# Patient Record
Sex: Female | Born: 1965 | Race: White | Hispanic: No | Marital: Married | Smoking: Never smoker
Health system: Southern US, Community
[De-identification: ages and names within clinical notes are randomized; demographics above are authoritative.]

## PROBLEM LIST (undated history)

## (undated) DIAGNOSIS — Z9889 Other specified postprocedural states: Secondary | ICD-10-CM

## (undated) DIAGNOSIS — T8859XA Other complications of anesthesia, initial encounter: Secondary | ICD-10-CM

## (undated) DIAGNOSIS — K529 Noninfective gastroenteritis and colitis, unspecified: Secondary | ICD-10-CM

## (undated) DIAGNOSIS — R112 Nausea with vomiting, unspecified: Secondary | ICD-10-CM

## (undated) DIAGNOSIS — R519 Headache, unspecified: Secondary | ICD-10-CM

## (undated) HISTORY — PX: WISDOM TOOTH EXTRACTION: SHX21

## (undated) HISTORY — PX: MYRINGOTOMY: SUR874

## (undated) HISTORY — DX: Noninfective gastroenteritis and colitis, unspecified: K52.9

## (undated) HISTORY — PX: OTHER SURGICAL HISTORY: SHX169

## (undated) HISTORY — PX: RHINOPLASTY: SUR1284

---

## 1987-03-08 HISTORY — PX: RHINOPLASTY: SUR1284

## 1988-03-07 HISTORY — PX: OTHER SURGICAL HISTORY: SHX169

## 1997-03-07 HISTORY — PX: LAPAROTOMY: SHX154

## 2008-12-11 ENCOUNTER — Ambulatory Visit: Payer: Self-pay | Admitting: Gastroenterology

## 2008-12-11 ENCOUNTER — Encounter: Payer: Self-pay | Admitting: Urgent Care

## 2008-12-11 DIAGNOSIS — N809 Endometriosis, unspecified: Secondary | ICD-10-CM | POA: Insufficient documentation

## 2008-12-11 DIAGNOSIS — K5909 Other constipation: Secondary | ICD-10-CM

## 2008-12-11 DIAGNOSIS — Z8719 Personal history of other diseases of the digestive system: Secondary | ICD-10-CM | POA: Insufficient documentation

## 2008-12-15 LAB — CONVERTED CEMR LAB: IgA: 246 mg/dL (ref 68–378)

## 2009-01-27 ENCOUNTER — Ambulatory Visit: Payer: Self-pay | Admitting: Gastroenterology

## 2009-01-27 DIAGNOSIS — R198 Other specified symptoms and signs involving the digestive system and abdomen: Secondary | ICD-10-CM

## 2009-05-05 HISTORY — PX: ESOPHAGOGASTRODUODENOSCOPY: SHX1529

## 2009-05-05 HISTORY — PX: COLONOSCOPY: SHX174

## 2009-05-26 ENCOUNTER — Ambulatory Visit: Payer: Self-pay | Admitting: Gastroenterology

## 2009-05-26 DIAGNOSIS — R109 Unspecified abdominal pain: Secondary | ICD-10-CM | POA: Insufficient documentation

## 2009-05-27 LAB — CONVERTED CEMR LAB
ALT: 13 units/L (ref 0–35)
AST: 15 units/L (ref 0–37)
Albumin: 4.6 g/dL (ref 3.5–5.2)
Alkaline Phosphatase: 51 units/L (ref 39–117)
BUN: 11 mg/dL (ref 6–23)
Bacteria, UA: NONE SEEN
Basophils Absolute: 0 10*3/uL (ref 0.0–0.1)
Basophils Relative: 0 % (ref 0–1)
Bilirubin Urine: NEGATIVE
CO2: 26 meq/L (ref 19–32)
Calcium: 9.9 mg/dL (ref 8.4–10.5)
Casts: NONE SEEN /lpf
Chloride: 103 meq/L (ref 96–112)
Creatinine, Ser: 0.84 mg/dL (ref 0.40–1.20)
Eosinophils Absolute: 0.1 10*3/uL (ref 0.0–0.7)
Eosinophils Relative: 1 % (ref 0–5)
Glucose, Bld: 92 mg/dL (ref 70–99)
HCT: 43.6 % (ref 36.0–46.0)
Hemoglobin, Urine: NEGATIVE
Hemoglobin: 13.5 g/dL (ref 12.0–15.0)
Ketones, ur: NEGATIVE mg/dL
Leukocytes, UA: NEGATIVE
Lipase: 41 units/L (ref 0–75)
Lymphocytes Relative: 39 % (ref 12–46)
Lymphs Abs: 2.7 10*3/uL (ref 0.7–4.0)
MCHC: 31 g/dL (ref 30.0–36.0)
MCV: 98.9 fL (ref 78.0–100.0)
Monocytes Absolute: 0.5 10*3/uL (ref 0.1–1.0)
Monocytes Relative: 6 % (ref 3–12)
Neutro Abs: 3.8 10*3/uL (ref 1.7–7.7)
Neutrophils Relative %: 54 % (ref 43–77)
Nitrite: NEGATIVE
Platelets: 324 10*3/uL (ref 150–400)
Potassium: 4 meq/L (ref 3.5–5.3)
Protein, ur: NEGATIVE mg/dL
RBC / HPF: NONE SEEN (ref <3)
RBC: 4.41 M/uL (ref 3.87–5.11)
RDW: 13 % (ref 11.5–15.5)
Sodium: 140 meq/L (ref 135–145)
Specific Gravity, Urine: 1.021 (ref 1.005–1.030)
Squamous Epithelial / LPF: NONE SEEN /lpf
Total Bilirubin: 0.4 mg/dL (ref 0.3–1.2)
Total Protein: 7.2 g/dL (ref 6.0–8.3)
Urine Glucose: NEGATIVE mg/dL
Urobilinogen, UA: 1 (ref 0.0–1.0)
WBC, UA: NONE SEEN cells/hpf (ref <3)
WBC: 7 10*3/uL (ref 4.0–10.5)
pH: 7.5 (ref 5.0–8.0)

## 2009-05-29 ENCOUNTER — Telehealth (INDEPENDENT_AMBULATORY_CARE_PROVIDER_SITE_OTHER): Payer: Self-pay

## 2009-06-02 ENCOUNTER — Ambulatory Visit (HOSPITAL_COMMUNITY): Admission: RE | Admit: 2009-06-02 | Discharge: 2009-06-02 | Payer: Self-pay | Admitting: Gastroenterology

## 2009-06-02 ENCOUNTER — Ambulatory Visit: Payer: Self-pay | Admitting: Gastroenterology

## 2009-07-29 ENCOUNTER — Ambulatory Visit: Payer: Self-pay | Admitting: Gastroenterology

## 2010-03-17 ENCOUNTER — Encounter: Payer: Self-pay | Admitting: Internal Medicine

## 2010-03-17 ENCOUNTER — Ambulatory Visit
Admission: RE | Admit: 2010-03-17 | Discharge: 2010-03-17 | Payer: Self-pay | Source: Home / Self Care | Attending: Internal Medicine | Admitting: Internal Medicine

## 2010-03-23 ENCOUNTER — Ambulatory Visit (HOSPITAL_COMMUNITY)
Admission: RE | Admit: 2010-03-23 | Discharge: 2010-03-23 | Payer: Self-pay | Source: Home / Self Care | Attending: Internal Medicine | Admitting: Internal Medicine

## 2010-03-29 ENCOUNTER — Encounter: Payer: Self-pay | Admitting: Internal Medicine

## 2010-04-06 ENCOUNTER — Encounter (HOSPITAL_COMMUNITY)
Admission: RE | Admit: 2010-04-06 | Discharge: 2010-04-06 | Payer: Self-pay | Source: Home / Self Care | Attending: Internal Medicine | Admitting: Internal Medicine

## 2010-04-06 NOTE — Letter (Signed)
Summary: TCS/EGD ORDER  TCS/EGD ORDER   Imported By: Ave Filter 05/26/2009 16:05:51  _____________________________________________________________________  External Attachment:    Type:   Image     Comment:   External Document

## 2010-04-06 NOTE — Assessment & Plan Note (Signed)
Summary: RUQ ABD PAIN, DIARRHEA   Visit Type:  Follow-up Visit Primary Care Provider:  Daphine Deutscher, NP-c  Chief Complaint:  abd pain.  History of Present Illness: Starting to have pain in upr abd. RUQ, sharp & achy, moves to right flank. Coming and going over the past 2 weeks. Continuous for past 2 days. Same thing happened in JAN 2011. Had GB check in the past and within last 5 years. feels like stomach doesn't empty after a meal. Feels full after late snack and doesn't want to eat because she swakens feeling full. Ears cause she tinks she should. Eats "whole grains" and organic, fruits, vegetables. In past cantelope or strawberry used to cause a BM and now nothing. Started to take Magnesium for the past 2 weeks.  No worse with eating. Nothing makes it worse. Doesn't keep her awake at night. No nausea or vomiting. No problems swallowing. May feel like a lump in right chest when she swallows. Average 2-3x/week.  Took the Amitiza and it made her feel weird. No BRBPR, or black stool. No ASA, BC, Goody's, or Aleve. Rare Ibuprofen. Still having the tiny ball stool. Never had a top or bottom look see. Last EtOH:  ~1 mo ago.  Current Medications (verified): 1)  Birth Control .... Once Daily 2)  Multivitamin .... Once Daily 3)  Magnesium 350 Mg .... Once Daily  Allergies (verified): 1)  ! Codeine 2)  ! Pcn 3)  ! 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 Head:  Normocephalic and atraumatic. Eyes:  PERRLA, no icterus. Mouth:  No deformity or lesions. Neck:  Supple; no masses. Lungs:  Clear throughout to auscultation. Heart:  Regular rate and rhythm; no murmurs. Abdomen:  Soft, mild TTP in epigastrium, and nondistended.  Normal bowel sounds. No TTP in RUQ with inspiration. Extremities:  No edema or deformities noted. Neurologic:  Alert and  oriented x4;  grossly normal neurologically.  Impression & Recommendations:  Problem # 1:  ABDOMINAL PAIN (ICD-789.00) Likely 2o to IBS-d. Differential diagnosis includes CELIAC SPRUE,endometriosis, H. pylori gastritis, less likely pancreatitis, cholecystitis, pancreatic CA, UTI, or kidney stone. Labs and then TCS/EGD, SUPREP, DUODENAL, GASTRIC, AND COLON BIOPSIES. WILL NEED PHENERGAN. Pt has jad NV after sedation. If etiology revealed for pain, then proceed w/ CT AP w/ ivc. OPV in 2 mos.  Orders: T-CBC w/Diff [phone]) T-Comprehensive Metabolic Panel [phone]) T-Lipase [phone]) T-Urinalysis ([id number]) T-Urine Microscopic ([id number])   cc: PCP  Appended Document: Orders Update    Clinical Lists Changes  Orders: Added new Service order of Est. Patient Level V (87564) - Signed

## 2010-04-06 NOTE — Progress Notes (Signed)
Summary: PHONE NOTE/ FYI  FOR DR. FIELDS  Phone Note Call from Patient   Caller: Patient Summary of Call: PT WANTED TO LET DR. FIELDS KNOW THAT SHE HAS HAD SURGERIES IN THE PAST AND HAD NAUSEA/VOMITING. JUST WANTED HER TO BE AWARE, SO SHE COULD MAKE PROVISIONS. Initial call taken by: Cloria Spring LPN,  May 29, 2009 3:56 PM

## 2010-04-06 NOTE — Assessment & Plan Note (Signed)
Summary: IBS-MIXED   Visit Type:  Follow-up Visit Primary Care Provider:  Paulene Floor  Chief Complaint:  F/U TCS/EGD.  History of Present Illness: Feeling better. Mg helping. No bloating, and RUQ pain improved. Pt felt it took a prolonged period of time to recover from sedation and vague rectal of painful moments during the procedure. Has awakened during surgeyr before.  Current Medications (verified): 1)  Birth Control .... Once Daily 2)  Multivitamin .... Once Daily 3)  Magnesium 350 Mg .... Once Daily  Allergies (verified): 1)  ! Codeine 2)  ! Pcn 3)  ! 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 Head:  Normocephalic and atraumatic. Lungs:  Clear throughout to auscultation. Heart:  Regular rate and rhythm; no murmurs. Abdomen:  PT IS EASILY TICKLED. Soft, nontender and nondistended.  Normal bowel sounds.  Impression & Recommendations:  Problem # 1:  IRRITABLE BOWEL SYNDROME, HX OF (ICD-V12.79) Assessment Improved  Continue Mg and discussed benefits and side effects of a high fiber diet. TCS in 10 YEARS WITH OVERTUBE AND PROPOFOL. OPV as needed.  CC: PCP  Orders: Est. Patient Level II (13086)  Appended Document: IBS-MIXED REMINDER IN COMPUTER

## 2010-04-08 ENCOUNTER — Encounter: Payer: Self-pay | Admitting: Internal Medicine

## 2010-04-08 NOTE — Assessment & Plan Note (Signed)
Summary: stomach pain/ss   Visit Type:  Follow-up Visit Primary Care Provider:  Paulene Floor  CC:  abd pain.  History of Present Illness: Here for f/u. Long-standing RUQ pain. EGD/colon done 3/11.   States RUQ abdominal pain worsened since Christmas. At one point had severe episode of nausea, almost felt like was going to pass out. Feels like a stabbing pain in RUQ, constant, not as bad first thing in the morning. Feels like a band that stretches across top of mid abdomen. Feels like fills up fast, pain not exacerbated by eating drinking. As far as IBS, trying to incorporate fiber into dietary choices, normally BM 3x/week. No cramping. Has tried several probiotics in past and not noticed a difference.    [place] of abd 2008 which was reportedly nl MRI, reportedly nl 1. Extremely tortuous colon.  Two 3-mm sessile transverse colon polyps     removed via cold forceps.  Random cold forceps biopsies obtained     via cold forceps to evaluate for microscopic colitis as an etiology     for intermittent diarrhea.  No masses, inflammatory changes, or     diverticular AVMs seen. 2. Normal retroflexed view of the rectum. 3. Normal esophagus without evidence of Barrett's mass, erosion,     ulceration, or stricture. 4. Mild patchy erythema in the antrum without erosion or ulceration.     Biopsies obtained via cold forceps to evaluate for H. pylori     gastritis or eosinophilic gastritis. 5. Normal-appearing duodenal bulb and second portion of the duodenum     with moderate bile staining.  Biopsies obtained via cold forceps to     evaluate for celiac sprue as an etiology for intermittent diarrhea     and dyspepsia.  Current Medications (verified): 1)  Birth Control .... Once Daily 2)  Multivitamin .... Once Daily 3)  Magnesium 350 Mg .... Once Daily 4)  Flax Seed Oil .... Once Daily  Allergies (verified): 1)  ! Codeine 2)  ! Pcn 3)  ! Toradol  Review of Systems General:  Denies fever, chills,  and anorexia. Eyes:  Denies blurring, irritation, and discharge. ENT:  Denies sore throat, hoarseness, and difficulty swallowing. CV:  Denies chest pains and syncope. Resp:  Denies dyspnea at rest and wheezing. GI:  Complains of abdominal pain and constipation; denies difficulty swallowing, pain on swallowing, nausea, change in bowel habits, bloody BM's, and black BMs. GU:  Denies urinary burning and urinary frequency. MS:  Denies joint pain / LOM, joint swelling, and joint stiffness. Derm:  Denies rash, itching, and dry skin. Neuro:  Denies weakness and syncope. Psych:  Denies depression and anxiety. Endo:  Denies cold intolerance and heat intolerance.  Vital Signs:  Patient profile:   45 year old female Height:      68.5 inches Weight:      144 pounds BMI:     21.65 Temp:     99.2 degrees F oral Pulse rate:   72 / minute BP sitting:   122 / 88  (left arm) Cuff size:   regular  Vitals Entered By: Hendricks Limes LPN (March 17, 2010 2:23 PM)  Physical Exam  General:  Well developed, well nourished, no acute distress. Head:  Normocephalic and atraumatic. Eyes:  PERRLA, no icterus. Lungs:  Clear throughout to auscultation. Heart:  Regular rate and rhythm; no murmurs, rubs,  or bruits. Abdomen:  VERY ticklish. +BS. NT, ND. no HSM. no rebound or guarding.  Msk:  Symmetrical with no  gross deformities. Normal posture. Neurologic:  Alert and  oriented x4;  grossly normal neurologically. Psych:  Alert and cooperative. Normal mood and affect.  Impression & Recommendations:  Problem # 1:  ABDOMINAL PAIN (ICD-51.18)  45 year old pleasant female with long-standing hx of chronic RUQ abdominal pain. Has undergone EGD 3/11. No etiology to explain pain. [place] in 2008 reportedly nl. Not exacerbated by eating/drinking. Has not undergone HIDA. ?biliary component although [place] in past reportedly nl. May be functional abdominal pain. Pt hesitant to try many oral medications. states she does not like  taking anything extra.  Obtain recent labs done in Nov (included CBC, CMP) [place] of abdomen. Depending on results, will likely order HIDA  Orders: Est. Patient Level II (16109)  Problem # 2:  IRRITABLE BOWEL SYNDROME, HX OF (ICD-V12.79)  Stable for pt. BM 3X per week. No pain or bloating. Incorporating fiber. Probiotics don't seem to make a difference. Continue current regimen.   Orders: Est. Patient Level II (60454)

## 2010-04-08 NOTE — Letter (Signed)
Summary: RAD ORDER U/S ABD  RAD ORDER U/S ABD   Imported By: Rexene Alberts 03/17/2010 15:03:52  _____________________________________________________________________  External Attachment:    Type:   Image     Comment:   External Document

## 2010-04-08 NOTE — Letter (Signed)
Summary: HIDA SCAN ORDER  HIDA SCAN ORDER   Imported By: Ave Filter 03/29/2010 14:25:27  _____________________________________________________________________  External Attachment:    Type:   Image     Comment:   External Document

## 2010-04-14 NOTE — Letter (Signed)
Summary: SURGICAL REFERRAL  SURGICAL REFERRAL   Imported By: Ave Filter 04/08/2010 13:46:02  _____________________________________________________________________  External Attachment:    Type:   Image     Comment:   External Document  Appended Document: SURGICAL REFERRAL Pt aware of appt.

## 2012-06-11 ENCOUNTER — Telehealth: Payer: Self-pay | Admitting: Nurse Practitioner

## 2012-06-11 ENCOUNTER — Encounter: Payer: Self-pay | Admitting: General Practice

## 2012-06-11 ENCOUNTER — Ambulatory Visit (INDEPENDENT_AMBULATORY_CARE_PROVIDER_SITE_OTHER): Payer: BC Managed Care – PPO | Admitting: General Practice

## 2012-06-11 VITALS — BP 139/91 | HR 84 | Temp 99.9°F | Ht 68.5 in | Wt 145.5 lb

## 2012-06-11 DIAGNOSIS — J029 Acute pharyngitis, unspecified: Secondary | ICD-10-CM

## 2012-06-11 MED ORDER — CEFDINIR 300 MG PO CAPS: 300.0000 mg | ORAL_CAPSULE | Freq: Two times a day (BID) | ORAL | Status: AC

## 2012-06-11 NOTE — Progress Notes (Signed)
  Subjective:    Patient ID: Destiny Gardner, female    DOB: 1965/11/09, 47 y.o.   MRN: [id number]  Sore Throat  This is a new problem. The current episode started in the past 7 days. The problem has been unchanged. The pain is worse on the right side. The maximum temperature recorded prior to her arrival was 100 - 100.9 F. The pain is at a severity of 8/10. The pain is moderate. Associated symptoms include headaches and neck pain. Pertinent negatives include no abdominal pain, congestion, coughing, diarrhea, ear pain or shortness of breath. She has had exposure to strep. She has tried NSAIDs for the symptoms. The treatment provided mild relief.      Review of Systems  Constitutional: Negative for fever, chills, activity change and appetite change.  HENT: Positive for neck pain. Negative for ear pain and congestion.   Respiratory: Negative for cough and shortness of breath.   Gastrointestinal: Negative for abdominal pain and diarrhea.  Genitourinary: Negative for difficulty urinating.  Skin: Negative for rash.  Neurological: Positive for light-headedness and headaches. Negative for dizziness.  Psychiatric/Behavioral: Negative.        Objective:   Physical Exam  Constitutional: She is oriented to person, place, and time. She appears well-developed and well-nourished.  HENT:  Mouth/Throat: Posterior oropharyngeal erythema present.  Eyes: Conjunctivae and EOM are normal. Pupils are equal, round, and reactive to light.  Neck: No thyromegaly present.  Cardiovascular: Normal rate, regular rhythm and normal heart sounds.   No murmur heard. Pulmonary/Chest: Effort normal and breath sounds normal. No respiratory distress. She exhibits no tenderness.  Lymphadenopathy:    She has no cervical adenopathy.  Neurological: She is alert and oriented to person, place, and time.  Skin: Skin is warm and dry. No rash noted.  Psychiatric: She has a normal mood and affect.   Results for orders placed in  visit on 06/11/12  POCT RAPID STREP A (OFFICE)      Result Value Range   Rapid Strep A Screen Negative  Negative         Assessment & Plan:  Continue antibiotics even if feeling better Increase fluid intake Motrin or tylenol OTC OTC decongestant Throat lozenges if help New toothbrush in 3 days Proper handwashing Patient verbalized understanding RTO if symptoms worsen or unresolved  Raymon Mutton, FNP-C

## 2012-06-11 NOTE — Patient Instructions (Signed)
Viral Pharyngitis  Viral pharyngitis is a viral infection that produces redness, pain, and swelling (inflammation) of the throat. It can spread from person to person (contagious).  CAUSES  Viral pharyngitis is caused by inhaling a large amount of certain germs called viruses. Many different viruses cause viral pharyngitis.  SYMPTOMS  Symptoms of viral pharyngitis include:   Sore throat.   Tiredness.   Stuffy nose.   Low-grade fever.   Congestion.   Cough.  TREATMENT  Treatment includes rest, drinking plenty of fluids, and the use of over-the-counter medication (approved by your caregiver).  HOME CARE INSTRUCTIONS    Drink enough fluids to keep your urine clear or pale yellow.   Eat soft, cold foods such as ice cream, frozen ice pops, or gelatin dessert.   Gargle with warm salt water (1 tsp salt per 1 qt of water).   If over age 7, throat lozenges may be used safely.   Only take over-the-counter or prescription medicines for pain, discomfort, or fever as directed by your caregiver. Do not take aspirin.  To help prevent spreading viral pharyngitis to others, avoid:   Mouth-to-mouth contact with others.   Sharing utensils for eating and drinking.   Coughing around others.  SEEK MEDICAL CARE IF:    You are better in a few days, then become worse.   You have a fever or pain not helped by pain medicines.   There are any other changes that concern you.  Document Released: 12/01/2004 Document Revised: 05/16/2011 Document Reviewed: 04/29/2010  Exit[employer] Patient Information 2013 [employer].

## 2012-06-11 NOTE — Telephone Encounter (Signed)
appt made

## 2013-03-07 HISTORY — PX: HERNIA REPAIR: SHX51

## 2014-01-10 ENCOUNTER — Ambulatory Visit (INDEPENDENT_AMBULATORY_CARE_PROVIDER_SITE_OTHER): Payer: BC Managed Care – PPO | Admitting: Family Medicine

## 2014-01-10 VITALS — BP 131/80 | HR 79 | Temp 100.6°F | Ht 68.5 in | Wt 147.4 lb

## 2014-01-10 DIAGNOSIS — J029 Acute pharyngitis, unspecified: Secondary | ICD-10-CM

## 2014-01-10 LAB — POCT RAPID STREP A (OFFICE): Rapid Strep A Screen: NEGATIVE

## 2014-01-10 MED ORDER — CEFDINIR 300 MG PO CAPS: 300.0000 mg | ORAL_CAPSULE | Freq: Two times a day (BID) | ORAL | Status: DC

## 2014-01-10 NOTE — Progress Notes (Signed)
   Subjective:    Patient ID: Destiny Gardner, female    DOB: 1965/09/16, 48 y.o.   MRN: [id number]  HPI C/o sore throat.  She is having fever and fatigue  Review of Systems  Constitutional: Negative for fever.  HENT: Negative for ear pain.   Eyes: Negative for discharge.  Respiratory: Negative for cough.   Cardiovascular: Negative for chest pain.  Gastrointestinal: Negative for abdominal distention.  Endocrine: Negative for polyuria.  Genitourinary: Negative for difficulty urinating.  Musculoskeletal: Negative for gait problem and neck pain.  Skin: Negative for color change and rash.  Neurological: Negative for speech difficulty and headaches.  Psychiatric/Behavioral: Negative for agitation.       Objective:    BP 131/80 mmHg  Pulse 79  Temp(Src) 100.6 F (38.1 C) (Oral)  Ht 5' 8.5" (1.74 m)  Wt 147 lb 6.4 oz (66.86 kg)  BMI 22.08 kg/m2 Physical Exam  Constitutional: She is oriented to person, place, and time. She appears well-developed and well-nourished.  HENT:  Head: Normocephalic and atraumatic.  Mouth/Throat: Oropharynx is clear and moist.  Eyes: Pupils are equal, round, and reactive to light.  Neck: Normal range of motion. Neck supple.  Cardiovascular: Normal rate and regular rhythm.   No murmur heard. Pulmonary/Chest: Effort normal and breath sounds normal.  Abdominal: Soft. Bowel sounds are normal. There is no tenderness.  Neurological: She is alert and oriented to person, place, and time.  Skin: Skin is warm and dry.  Psychiatric: She has a normal mood and affect.          Assessment & Plan:     ICD-9-CM ICD-10-CM   1. Sore throat 462 J02.9 POCT rapid strep A   Push po fluids, rest, tylenol and motrin otc prn as directed for fever, arthralgias, and myalgias.  Follow up prn if sx's continue or persist.  Return if symptoms worsen or fail to improve.  Deatra CanterWilliam J Nga Rabon FNP

## 2014-01-17 ENCOUNTER — Other Ambulatory Visit: Payer: Self-pay | Admitting: Family Medicine

## 2014-01-17 MED ORDER — CEFDINIR 300 MG PO CAPS: 300.0000 mg | ORAL_CAPSULE | Freq: Two times a day (BID) | ORAL | Status: DC

## 2014-01-17 NOTE — Telephone Encounter (Signed)
[facility] Verbalizes understanding

## 2014-01-18 ENCOUNTER — Ambulatory Visit (INDEPENDENT_AMBULATORY_CARE_PROVIDER_SITE_OTHER): Payer: BC Managed Care – PPO | Admitting: Family Medicine

## 2014-01-18 VITALS — BP 136/78 | HR 102 | Temp 98.3°F | Wt 142.8 lb

## 2014-01-18 DIAGNOSIS — R05 Cough: Secondary | ICD-10-CM

## 2014-01-18 DIAGNOSIS — R059 Cough, unspecified: Secondary | ICD-10-CM

## 2014-01-18 DIAGNOSIS — J069 Acute upper respiratory infection, unspecified: Secondary | ICD-10-CM

## 2014-01-18 NOTE — Progress Notes (Signed)
   Subjective:    Patient ID: Destiny Gardner, female    DOB: Sep 21, 1965, 48 y.o.   MRN: [id number]  HPI Pt is here today for sore throat and cough.  She is currently taking Omnicef.  She is scheduled to have a hernia repair on Wednesday.   Review of Systems  HENT: Positive for sore throat. Negative for ear pain and postnasal drip.   Respiratory: Positive for cough (dry x 3-4 days).   Neurological: Negative for headaches.       Objective:   Physical Exam  Constitutional: She is oriented to person, place, and time. She appears well-developed and well-nourished. No distress.  HENT:  Head: Normocephalic and atraumatic.  Right Ear: External ear normal.  Left Ear: External ear normal.  Nose: Nose normal.  Mouth/Throat: Oropharynx is clear and moist. No oropharyngeal exudate.  Minimal redness posterior throat  Eyes: Conjunctivae and EOM are normal. Pupils are equal, round, and reactive to light. Right eye exhibits no discharge. Left eye exhibits no discharge. No scleral icterus.  Neck: Normal range of motion. Neck supple. No thyromegaly present.  Cardiovascular: Normal rate, regular rhythm and normal heart sounds.   No murmur heard. Pulmonary/Chest: Effort normal and breath sounds normal. She has no wheezes. She has no rales.  Dry cough  Neurological: She is alert and oriented to person, place, and time.  Skin: Skin is warm and dry. No rash noted.  Psychiatric: She has a normal mood and affect. Her behavior is normal. Judgment and thought content normal.  Nursing note and vitals reviewed.  BP 136/78 mmHg  Pulse 102  Temp(Src) 98.3 F (36.8 C) (Oral)  Wt 142 lb 12.8 oz (64.774 kg)        Assessment & Plan:  1. URI (upper respiratory infection)  2. Cough  Patient Instructions  Continue drinking plenty of fluids Take Tylenol for aches pains and fever Take Mucinex maximum strength, blue and white in color, 1 twice daily with a large glass of water for cough and congestion or  take 2 of the regular strength twice daily Use saline nose spray as directed Continue to take and complete the Omnicef Keep the house is cool as possible   Nyra Capeson W. Zaneta Lightcap MD

## 2014-01-18 NOTE — Patient Instructions (Signed)
Continue drinking plenty of fluids Take Tylenol for aches pains and fever Take Mucinex maximum strength, blue and white in color, 1 twice daily with a large glass of water for cough and congestion or take 2 of the regular strength twice daily Use saline nose spray as directed Continue to take and complete the Omnicef Keep the house is cool as possible

## 2014-01-20 ENCOUNTER — Other Ambulatory Visit: Payer: Self-pay | Admitting: Family Medicine

## 2014-01-28 ENCOUNTER — Ambulatory Visit (INDEPENDENT_AMBULATORY_CARE_PROVIDER_SITE_OTHER): Payer: BC Managed Care – PPO | Admitting: Nurse Practitioner

## 2014-01-28 ENCOUNTER — Encounter: Payer: Self-pay | Admitting: Nurse Practitioner

## 2014-01-28 VITALS — BP 128/80 | HR 109 | Temp 99.2°F | Ht 68.5 in | Wt 140.8 lb

## 2014-01-28 DIAGNOSIS — J209 Acute bronchitis, unspecified: Secondary | ICD-10-CM

## 2014-01-28 MED ORDER — DOXYCYCLINE HYCLATE 100 MG PO TABS: 100.0000 mg | ORAL_TABLET | Freq: Two times a day (BID) | ORAL | Status: DC

## 2014-01-28 MED ORDER — ALBUTEROL SULFATE HFA 108 (90 BASE) MCG/ACT IN AERS: 2.0000 | INHALATION_SPRAY | Freq: Four times a day (QID) | RESPIRATORY_TRACT | Status: DC | PRN

## 2014-01-28 MED ORDER — METHYLPREDNISOLONE ACETATE 80 MG/ML IJ SUSP
80.0000 mg | Freq: Once | INTRAMUSCULAR | Status: AC
  Administered 2014-01-28: 80 mg via INTRAMUSCULAR

## 2014-01-28 MED ORDER — BENZONATATE 200 MG PO CAPS: 200.0000 mg | ORAL_CAPSULE | Freq: Two times a day (BID) | ORAL | Status: DC | PRN

## 2014-01-28 NOTE — Progress Notes (Signed)
Subjective:    Patient ID: Destiny HarborLisa S Hayse, female    DOB: March 11, 1965, 48 y.o.   MRN: [id number]  HPI Patient in today c/o cough and congestion- started several days ago- cough has become productive- was on antibiotic last week and is no better. Has had 2 rounds of omnicef.    Review of Systems  Constitutional: Negative.   HENT: Negative.   Respiratory: Negative.   Cardiovascular: Negative.   Genitourinary: Negative.   Neurological: Negative.   Psychiatric/Behavioral: Negative.   All other systems reviewed and are negative.      Objective:   Physical Exam  Constitutional: She is oriented to person, place, and time. She appears well-developed and well-nourished.  HENT:  Right Ear: Hearing, tympanic membrane, external ear and ear canal normal.  Left Ear: Hearing, tympanic membrane, external ear and ear canal normal.  Nose: Mucosal edema and rhinorrhea present. Right sinus exhibits no maxillary sinus tenderness and no frontal sinus tenderness. Left sinus exhibits no maxillary sinus tenderness and no frontal sinus tenderness.  Mouth/Throat: Uvula is midline, oropharynx is clear and moist and mucous membranes are normal.  Eyes: Pupils are equal, round, and reactive to light.  Neck: Normal range of motion. Neck supple.  Cardiovascular: Normal rate, regular rhythm and normal heart sounds.   Pulmonary/Chest: Effort normal and breath sounds normal.  Deep wet cough  Abdominal: Soft. Bowel sounds are normal.  Lymphadenopathy:    She has no cervical adenopathy.  Neurological: She is alert and oriented to person, place, and time.  Skin: Skin is warm and dry.  Psychiatric: She has a normal mood and affect. Her behavior is normal. Judgment and thought content normal.   BP 128/80 mmHg  Pulse 109  Temp(Src) 99.2 F (37.3 C) (Oral)  Ht 5' 8.5" (1.74 m)  Wt 140 lb 12.8 oz (63.866 kg)  BMI 21.09 kg/m2        Assessment & Plan:   1. Acute bronchitis, unspecified organism    Meds  ordered this encounter  Medications  . doxycycline (VIBRA-TABS) 100 MG tablet    Sig: Take 1 tablet (100 mg total) by mouth 2 (two) times daily.    Dispense:  20 tablet    Refill:  0    Order Specific Question:  Supervising Provider    Answer:  Ernestina PennaMOORE, DONALD W [[id number]]  . benzonatate (TESSALON) 200 MG capsule    Sig: Take 1 capsule (200 mg total) by mouth 2 (two) times daily as needed for cough.    Dispense:  20 capsule    Refill:  0    Order Specific Question:  Supervising Provider    Answer:  Ernestina PennaMOORE, DONALD W [[id number]]  . methylPREDNISolone acetate (DEPO-MEDROL) injection 80 mg    Sig:   . albuterol (PROVENTIL HFA;VENTOLIN HFA) 108 (90 BASE) MCG/ACT inhaler    Sig: Inhale 2 puffs into the lungs every 6 (six) hours as needed for wheezing or shortness of breath.    Dispense:  1 Inhaler    Refill:  0    Order Specific Question:  Supervising Provider    Answer:  Ernestina PennaMOORE, DONALD W [[id number]]    1. Take meds as prescribed 2. Use a cool mist humidifier especially during the winter months and when heat has been humid. 3. Use saline nose sprays frequently 4. Saline irrigations of the nose can be very helpful if done frequently.  * 4X daily for 1 week*  * Use of a nettie pot can be helpful with this.  Follow directions with this* 5. Drink plenty of fluids 6. Keep thermostat turn down low 7.For any cough or congestion  Use plain Mucinex- regular strength or max strength is fine   * Children- consult with Pharmacist for dosing 8. For fever or aces or pains- take tylenol or ibuprofen appropriate for age and weight.  * for fevers greater than 101 orally you may alternate ibuprofen and tylenol every  3 hours.   Swathi-Margaret Hassell Done, FNP

## 2014-01-28 NOTE — Patient Instructions (Signed)

## 2014-02-04 ENCOUNTER — Telehealth: Payer: Self-pay | Admitting: Nurse Practitioner

## 2014-02-04 NOTE — Telephone Encounter (Signed)
Pt aware of MMM feedback, pt voiced understanding, will close encounter.

## 2014-02-04 NOTE — Telephone Encounter (Signed)
Pt still has productive cough, no fever. Feels a little better. Does she need chest xray? Works in [employer]. Pt wants to travel to [place] to see parents that are not well. Is she contagious?

## 2014-02-04 NOTE — Telephone Encounter (Signed)
Probably viral- can take awhile for cough to resolve- should not be contagious any longer- just continue to treat cough.

## 2014-10-30 ENCOUNTER — Encounter (INDEPENDENT_AMBULATORY_CARE_PROVIDER_SITE_OTHER): Payer: Self-pay

## 2014-10-30 ENCOUNTER — Encounter: Payer: Self-pay | Admitting: Nurse Practitioner

## 2014-10-30 ENCOUNTER — Ambulatory Visit (INDEPENDENT_AMBULATORY_CARE_PROVIDER_SITE_OTHER): Payer: BC Managed Care – PPO | Admitting: Nurse Practitioner

## 2014-10-30 VITALS — BP 146/82 | HR 79 | Temp 98.1°F | Ht 68.0 in | Wt 143.0 lb

## 2014-10-30 DIAGNOSIS — R319 Hematuria, unspecified: Secondary | ICD-10-CM

## 2014-10-30 DIAGNOSIS — N3001 Acute cystitis with hematuria: Secondary | ICD-10-CM | POA: Diagnosis not present

## 2014-10-30 LAB — POCT URINALYSIS DIPSTICK
Bilirubin, UA: NEGATIVE
Glucose, UA: NEGATIVE
Ketones, UA: NEGATIVE
LEUKOCYTES UA: NEGATIVE
NITRITE UA: NEGATIVE
PH UA: 6.5
PROTEIN UA: NEGATIVE
Spec Grav, UA: <=1.005
UROBILINOGEN UA: NEGATIVE

## 2014-10-30 LAB — POCT UA - MICROSCOPIC ONLY
Bacteria, U Microscopic: NEGATIVE
CASTS, UR, LPF, POC: NEGATIVE
Crystals, Ur, HPF, POC: NEGATIVE
Mucus, UA: NEGATIVE
WBC, UR, HPF, POC: NEGATIVE
YEAST UA: NEGATIVE

## 2014-10-30 MED ORDER — NITROFURANTOIN MONOHYD MACRO 100 MG PO CAPS: 100.0000 mg | ORAL_CAPSULE | Freq: Two times a day (BID) | ORAL | Status: DC

## 2014-10-30 MED ORDER — PHENAZOPYRIDINE HCL 100 MG PO TABS: 100.0000 mg | ORAL_TABLET | Freq: Three times a day (TID) | ORAL | Status: DC | PRN

## 2014-10-30 NOTE — Progress Notes (Signed)
  PCP:Bennie Pierini, FNP Chief Complaint  Patient presents with  . Hematuria    Current Issues:  Presents with 4 days of dysuria, urinary urgency and urinary frequency Associated symptoms include:  chills, cloudy urine, hematuria, urinary frequency, urinary hesitancy and urinary urgency  There is no history of of similar symptoms. Sexually active:  Yes with female.   No concern for STI.  Prior to Admission medications   Medication Sig Start Date End Date Taking? Authorizing Provider  levonorgestrel-ethinyl estradiol (AVIANE,ALESSE,LESSINA) 0.1-20 MG-MCG tablet Take 1 tablet by mouth daily.   Yes Historical Provider, MD  albuterol (PROVENTIL HFA;VENTOLIN HFA) 108 (90 BASE) MCG/ACT inhaler Inhale 2 puffs into the lungs every 6 (six) hours as needed for wheezing or shortness of breath. Patient not taking: Reported on 10/30/2014 01/28/14   Mary-Margaret Daphine Deutscher, FNP  zolpidem (AMBIEN) 10 MG tablet  11/29/13   Historical Provider, MD    Review of Systems:NORMAL  PE:  BP 146/82 mmHg  Pulse 79  Temp(Src) 98.1 F (36.7 C) (Oral)  Ht  (1.727 m)  Wt 143 lb (64.864 kg)  BMI 21.75 kg/m2 Constitutional alert and oriented Heart RRR no  MGR Lungs clear all fields Back- no CVA tenderness Abdomen- mild suprapubic pain on palpation      Assessment and Plan:  1. Hematuria Acute cystits  Meds ordered this encounter  Medications  . nitrofurantoin, macrocrystal-monohydrate, (MACROBID) 100 MG capsule    Sig: Take 1 capsule (100 mg total) by mouth 2 (two) times daily. 1 po BId    Dispense:  14 capsule    Refill:  0    Order Specific Question:  Supervising Provider    Answer:  Ernestina Penna [[id number]]  . phenazopyridine (PYRIDIUM) 100 MG tablet    Sig: Take 1 tablet (100 mg total) by mouth 3 (three) times daily as needed for pain.    Dispense:  10 tablet    Refill:  0    Order Specific Question:  Supervising Provider    Answer:  Ernestina Penna [[id number]]   Take medication as  prescribe Cotton underwear Take shower not bath Cranberry juice, yogurt Force fluids AZO over the counter X2 days Culture pending RTO prn  Mary-Margaret Daphine Deutscher, FNP

## 2014-10-30 NOTE — Patient Instructions (Signed)

## 2015-05-06 ENCOUNTER — Institutional Professional Consult (permissible substitution): Payer: Self-pay | Admitting: Internal Medicine

## 2015-05-07 ENCOUNTER — Institutional Professional Consult (permissible substitution): Payer: Self-pay | Admitting: Internal Medicine

## 2015-05-18 ENCOUNTER — Other Ambulatory Visit (INDEPENDENT_AMBULATORY_CARE_PROVIDER_SITE_OTHER): Payer: BC Managed Care – PPO

## 2015-05-18 ENCOUNTER — Ambulatory Visit (INDEPENDENT_AMBULATORY_CARE_PROVIDER_SITE_OTHER): Payer: BC Managed Care – PPO | Admitting: Internal Medicine

## 2015-05-18 ENCOUNTER — Encounter: Payer: Self-pay | Admitting: Internal Medicine

## 2015-05-18 ENCOUNTER — Ambulatory Visit (INDEPENDENT_AMBULATORY_CARE_PROVIDER_SITE_OTHER)
Admission: RE | Admit: 2015-05-18 | Discharge: 2015-05-18 | Disposition: A | Discharge status: Home / Self Care | Payer: BC Managed Care – PPO | Source: Ambulatory Visit | Attending: Internal Medicine | Admitting: Internal Medicine

## 2015-05-18 VITALS — BP 130/70 | HR 95 | Ht 68.5 in | Wt 143.4 lb

## 2015-05-18 DIAGNOSIS — R059 Cough, unspecified: Secondary | ICD-10-CM

## 2015-05-18 DIAGNOSIS — R05 Cough: Secondary | ICD-10-CM

## 2015-05-18 LAB — CBC WITH DIFFERENTIAL/PLATELET
BASOS ABS: 0 10*3/uL (ref 0.0–0.1)
Basophils Relative: 0.4 % (ref 0.0–3.0)
Eosinophils Absolute: 0 10*3/uL (ref 0.0–0.7)
Eosinophils Relative: 0.8 % (ref 0.0–5.0)
HCT: 35.2 % — ABNORMAL LOW (ref 36.0–46.0)
Hemoglobin: 11.8 g/dL — ABNORMAL LOW (ref 12.0–15.0)
LYMPHS ABS: 1.7 10*3/uL (ref 0.7–4.0)
Lymphocytes Relative: 32.7 % (ref 12.0–46.0)
MCHC: 33.6 g/dL (ref 30.0–36.0)
MCV: 88.6 fl (ref 78.0–100.0)
MONOS PCT: 7.3 % (ref 3.0–12.0)
Monocytes Absolute: 0.4 10*3/uL (ref 0.1–1.0)
NEUTROS ABS: 3 10*3/uL (ref 1.4–7.7)
NEUTROS PCT: 58.8 % (ref 43.0–77.0)
PLATELETS: 295 10*3/uL (ref 150.0–400.0)
RBC: 3.97 Mil/uL (ref 3.87–5.11)
RDW: 15.2 % (ref 11.5–15.5)
WBC: 5.1 10*3/uL (ref 4.0–10.5)

## 2015-05-18 NOTE — Progress Notes (Signed)
Subjective:    Patient ID: Destiny Gardner, female    DOB: 12-16-1965,    MRN: [id number]  HPI  [age] yowf never smoker developed itching/sneezing/wheezing  in her early twenties while living in [place] saw allergist dx allergies to dogs/cats  tried shots no better tried avoidance and [place] [facility] [place] and into new home Nov 2016 and since then with cough and chest tightness esp R sided which improved but still coughing so self referred 05/18/2015  To pulmonary.  05/18/2015 1st [facility] office visit/ [place]   Chief Complaint  Patient presents with  . Pulmonary Consult    Self referral. Pt c/o right lung pain and cough since Nov 2016. Over the past few wks her pain has improved, but she still has some non prod cough.   episode of bronchitis Nov 2015 severe, better p albuterol and no need for chronic rx and did not restart    Then developed persistent daily cough when first moved into house in Nov 2016 and mostly dry but better since cleaned it up/ painted it    Kouffman Reflux v Neurogenic Cough Differentiator Reflux Comments  Do you awaken from a sound sleep coughing violently?                            With trouble breathing? No    Do you have choking episodes when you cannot  Get enough air, gasping for air ?              never   Do you usually cough when you lie down into  The bed, or when you just lie down to rest ?                          never   Do you usually cough after meals or eating?         no   Do you cough when (or after) you bend over?    Never    GERD SCORE     Kouffman Reflux v Neurogenic Cough Differentiator Neurogenic   Do you more-or-less cough all day long? Sporadic    Does change of temperature make you cough? Entire life   Does laughing or chuckling cause you to cough? Entire life   Do fumes (perfume, automobile fumes, burned  Toast, etc.,) cause you to cough ?      Hairspray now   Does speaking, singing, or talking on the phone cause you to cough   ?                Yes    Neurogenic/Airway score      No obvious other patterns in day to day or daytime variabilty or assoc  subjective wheeze overt sinus or hb symptoms. No unusual exp hx or h/o childhood pna/ asthma or knowledge of premature birth.  Sleeping ok without nocturnal  or early am exacerbation  of respiratory  c/o's or need for noct saba. Also denies any obvious fluctuation of symptoms with weather or environmental changes or other aggravating or alleviating factors except as outlined above   Current Medications, Allergies, Complete Past Medical History, Past Surgical History, Family History, and Social History were reviewed in [facility] electronic medical record.           Review of Systems  Constitutional: Negative for fever, chills and unexpected weight change.  HENT: Negative for  congestion, dental problem, ear pain, nosebleeds, postnasal drip, rhinorrhea, sinus pressure, sneezing, sore throat, trouble swallowing and voice change.   Eyes: Negative for visual disturbance.  Respiratory: Positive for cough. Negative for choking and shortness of breath.   Cardiovascular: Negative for chest pain and leg swelling.  Gastrointestinal: Negative for vomiting, abdominal pain and diarrhea.  Genitourinary: Negative for difficulty urinating.  Musculoskeletal: Negative for arthralgias.  Skin: Negative for rash.  Neurological: Negative for tremors, syncope and headaches.  Hematological: Does not bruise/bleed easily.       Objective:   Physical Exam  amb wf nad   Wt Readings from Last 3 Encounters:  05/18/15 143 lb 6.4 oz (65.046 kg)  10/30/14 143 lb (64.864 kg)  01/28/14 140 lb 12.8 oz (63.866 kg)    Vital signs reviewed  HEENT: nl dentition, turbinates, and oropharynx. Nl external ear canals without cough reflex   NECK :  without JVD/Nodes/TM/ nl carotid upstrokes bilaterally   LUNGS: no acc muscle use,  Nl contour chest which is clear to A and P bilaterally without cough on  insp or exp maneuvers   CV:  RRR  no s3 or murmur or increase in P2, no edema   ABD:  soft and nontender with nl inspiratory excursion in the supine position. No bruits or organomegaly, bowel sounds nl  MS:  Nl gait/ ext warm without deformities, calf tenderness, cyanosis or clubbing No obvious joint restrictions   SKIN: warm and dry without lesions    NEURO:  alert, approp, nl sensorium with  no motor deficits    CXR PA and Lateral:   05/18/2015 :    I personally reviewed images and agree with radiology impression as follows:  No active cardiopulmonary disease.  Labs ordered 05/18/2015 allergy profile                Assessment & Plan:

## 2015-05-18 NOTE — Patient Instructions (Addendum)
Please remember to go to the lab and x-ray department downstairs for your tests - we will call you with the results when they are available.  For drainage / throat tickle try take CHLORPHENIRAMINE  4 mg - take one every 4 hours as needed - available over the counter- may cause drowsiness so start with just a bedtime dose or two and see how you tolerate it before trying in daytime    Try prilosec otc 20mg   Take 30-60 min before first meal of the day and Pepcid ac (famotidine) 20 mg one @  bedtime until cough is completely gone for at least a week without the need for cough suppression  GERD (REFLUX)  is an extremely common cause of respiratory symptoms just like yours , many times with no obvious heartburn at all.    It can be treated with medication, but also with lifestyle changes including elevation of the head of your bed (ideally with 6 inch  bed blocks),  Smoking cessation, avoidance of late meals, excessive alcohol, and avoid fatty foods, chocolate, peppermint, colas, red wine, and acidic juices such as orange juice.  NO MINT OR MENTHOL PRODUCTS SO NO COUGH DROPS  USE SUGARLESS CANDY INSTEAD (Jolley ranchers or Stover's or Life Savers) or even ice chips will also do - the key is to swallow to prevent all throat clearing. NO OIL BASED VITAMINS - use powdered substitutes.  For cough > delsym 2 tsp every 12 hours as needed   If not better in 2 weeks return - if your tests suggest active allergy let me refer you to Dr Lucie LeatherKozlow the best around for this diagnosis

## 2015-05-18 NOTE — Progress Notes (Signed)
Quick Note:  Spoke with pt and notified of results per Dr. Wert. Pt verbalized understanding and denied any questions.  ______ 

## 2015-05-19 LAB — RESPIRATORY ALLERGY PROFILE REGION II ~~LOC~~
ALLERGEN, COTTONWOOD, T14: 0.1 kU/L — AB
Allergen, Cedar tree, t12: <0.1 kU/L
Allergen, Comm Silver Birch, t9: 0.18 kU/L — ABNORMAL HIGH
Allergen, Mouse Urine Protein, e78: <0.1 kU/L
Allergen, Oak,t7: <0.1 kU/L
Alternaria Alternata: <0.1 kU/L
Aspergillus fumigatus, m3: <0.1 kU/L
Box Elder IgE: <0.1 kU/L
Cat Dander: 0.64 kU/L — ABNORMAL HIGH
Common Ragweed: <0.1 kU/L
DOG DANDER: 0.72 kU/L — AB
IGE (IMMUNOGLOBULIN E), SERUM: 26 kU/L (ref <115)
Johnson Grass: <0.1 kU/L
Pecan/Hickory Tree IgE: 0.65 kU/L — ABNORMAL HIGH
Penicillium Notatum: <0.1 kU/L
Rough Pigweed  IgE: <0.1 kU/L
Sheep Sorrel IgE: <0.1 kU/L
Timothy Grass: <0.1 kU/L

## 2015-05-19 NOTE — Assessment & Plan Note (Addendum)
Spirometry 05/18/2015  Nl including fef 25-75   The most common causes of chronic cough in immunocompetent adults include the following: upper airway cough syndrome (UACS), previously referred to as postnasal drip syndrome (PNDS), which is caused by variety of rhinosinus conditions; (2) asthma; (3) GERD; (4) chronic bronchitis from cigarette smoking or other inhaled environmental irritants; (5) nonasthmatic eosinophilic bronchitis; and (6) bronchiectasis.   These conditions, singly or in combination, have accounted for up to 94% of the causes of chronic cough in prospective studies.   Other conditions have constituted no >6% of the causes in prospective studies These have included bronchogenic carcinoma, chronic interstitial pneumonia, sarcoidosis, left ventricular failure, ACEI-induced cough, and aspiration from a condition associated with pharyngeal dysfunction.    Chronic cough is often simultaneously caused by more than one condition. A single cause has been found from 38 to 82% of the time, multiple causes from 18 to 62%. Multiply caused cough has been the result of three diseases up to 42% of the time.       Based on hx and exam, this is most likely:  Classic Upper airway cough syndrome, so named because it's frequently impossible to sort out how much is  CR/sinusitis with freq throat clearing (which can be related to primary GERD)   vs  causing  secondary (" extra esophageal")  GERD from wide swings in gastric pressure that occur with throat clearing, often  promoting self use of mint and menthol lozenges that reduce the lower esophageal sphincter tone and exacerbate the problem further in a cyclical fashion.   These are the same pts (now being labeled as having "irritable larynx syndrome" by some cough centers) who not infrequently have a history of having failed to tolerate ace inhibitors,  dry powder inhalers or biphosphonates or report having atypical reflux symptoms that don't respond to  standard doses of PPI , and are easily confused as having aecopd or asthma flares by even experienced allergists/ pulmonologists.   The first step is to maximize GERD Rx  and eliminate pnds with H1 and cyclical coughing then regroup if the cough persists with methacholine challenge testing next step (vs allergy referral if profile Positive)   I had an extended discussion with the patient reviewing all relevant studies completed to date and  lasting 35 min/60 min initial eval   1) Explained: The standardized cough guidelines published in Chest by [place] in 2006 are still the best available and consist of a multiple step process (up to 12!) , not a single office visit,  and are intended  to address this problem logically,  with an alogrithm dependent on response to empiric treatment at  each progressive step  to determine a specific diagnosis with  minimal addtional testing needed. Therefore if adherence is an issue or can't be accurately verified,  it's very unlikely the standard evaluation and treatment will be successful here.      2)  Each maintenance medication was reviewed in detail including most importantly the difference between maintenance and prns and under what circumstances the prns are to be triggered using an action plan format that is not reflected in the computer generated alphabetically organized AVS.    Please see instructions for details which were reviewed in writing and the patient given a copy highlighting the part that I personally wrote and discussed at today's ov.   See instructions for specific recommendations which were reviewed directly with the patient who was given a copy with highlighter outlining  the key components.

## 2015-05-20 NOTE — Progress Notes (Signed)
Quick Note:  Attempted to call pt. Received busy tone. WCB ______

## 2015-05-22 NOTE — Progress Notes (Signed)
Quick Note:  Spoke with pt and notified of results per Dr. Wert. Pt verbalized understanding and denied any questions.  ______ 

## 2016-03-28 ENCOUNTER — Telehealth: Payer: BC Managed Care – PPO | Admitting: Family

## 2016-03-28 DIAGNOSIS — B9689 Other specified bacterial agents as the cause of diseases classified elsewhere: Secondary | ICD-10-CM

## 2016-03-28 DIAGNOSIS — J028 Acute pharyngitis due to other specified organisms: Secondary | ICD-10-CM

## 2016-03-28 MED ORDER — BENZONATATE 100 MG PO CAPS: 100.0000 mg | ORAL_CAPSULE | Freq: Three times a day (TID) | ORAL | 0 refills | Status: DC | PRN

## 2016-03-28 MED ORDER — PREDNISONE 5 MG PO TABS: 5.0000 mg | ORAL_TABLET | ORAL | 0 refills | Status: DC

## 2016-03-28 MED ORDER — DOXYCYCLINE HYCLATE 100 MG PO TABS: 100.0000 mg | ORAL_TABLET | Freq: Two times a day (BID) | ORAL | 0 refills | Status: DC

## 2016-03-28 NOTE — Progress Notes (Signed)
We are sorry that you are not feeling well.  Here is how we plan to help!  Based on what you have shared with me it looks like you have upper respiratory tract inflammation that has resulted in a significant cough.  Inflammation and infection in the upper respiratory tract is commonly called bronchitis and has four common causes:  Allergies, Viral Infections, Acid Reflux and Bacterial Infections.  Allergies, viruses and acid reflux are treated by controlling symptoms or eliminating the cause. An example might be a cough caused by taking certain blood pressure medications. You stop the cough by changing the medication. Another example might be a cough caused by acid reflux. Controlling the reflux helps control the cough.  Based on your presentation I believe you most likely have A cough due to bacteria.  When patients have a fever and a productive cough with a change in color or increased sputum production, we are concerned about bacterial bronchitis.  If left untreated it can progress to pneumonia.  If your symptoms do not improve with your treatment plan it is important that you contact your provider.   I hve prescribed Doxycycline 100 mg twice a day for 7 days     In addition you may use A non-prescription cough medication called Mucinex DM: take 2 tablets every 12 hours. and A prescription cough medication called Tessalon Perles 100mg. You may take 1-2 capsules every 8 hours as needed for your cough.  Sterapred 5 mg dosepak  USE OF BRONCHODILATOR ("RESCUE") INHALERS: There is a risk from using your bronchodilator too frequently.  The risk is that over-reliance on a medication which only relaxes the muscles surrounding the breathing tubes can reduce the effectiveness of medications prescribed to reduce swelling and congestion of the tubes themselves.  Although you feel brief relief from the bronchodilator inhaler, your asthma may actually be worsening with the tubes becoming more swollen and filled with  mucus.  This can delay other crucial treatments, such as oral steroid medications. If you need to use a bronchodilator inhaler daily, several times per day, you should discuss this with your provider.  There are probably better treatments that could be used to keep your asthma under control.     HOME CARE . Only take medications as instructed by your medical team. . Complete the entire course of an antibiotic. . Drink plenty of fluids and get plenty of rest. . Avoid close contacts especially the very young and the elderly . Cover your mouth if you cough or cough into your sleeve. . Always remember to wash your hands . A steam or ultrasonic humidifier can help congestion.   GET HELP RIGHT AWAY IF: . You develop worsening fever. . You become short of breath . You cough up blood. . Your symptoms persist after you have completed your treatment plan MAKE SURE YOU   Understand these instructions.  Will watch your condition.  Will get help right away if you are not doing well or get worse.  Your e-visit answers were reviewed by a board certified advanced clinical practitioner to complete your personal care plan.  Depending on the condition, your plan could have included both over the counter or prescription medications. If there is a problem please reply  once you have received a response from your provider. Your safety is important to us.  If you have drug allergies check your prescription carefully.    You can use MyChart to ask questions about today's visit, request a non-urgent call   back, or ask for a work or school excuse for 24 hours related to this e-Visit. If it has been greater than 24 hours you will need to follow up with your provider, or enter a new e-Visit to address those concerns. You will get an e-mail in the next two days asking about your experience.  I hope that your e-visit has been valuable and will speed your recovery. Thank you for using e-visits.   

## 2018-03-07 HISTORY — PX: COLONOSCOPY: SHX174

## 2019-02-18 ENCOUNTER — Other Ambulatory Visit: Payer: Self-pay | Admitting: Internal Medicine

## 2019-02-18 DIAGNOSIS — R1013 Epigastric pain: Secondary | ICD-10-CM

## 2019-02-18 DIAGNOSIS — R198 Other specified symptoms and signs involving the digestive system and abdomen: Secondary | ICD-10-CM

## 2019-04-11 ENCOUNTER — Encounter: Payer: Self-pay | Admitting: Gastroenterology

## 2019-06-06 ENCOUNTER — Ambulatory Visit: Payer: BC Managed Care – PPO | Attending: Internal Medicine

## 2019-06-06 DIAGNOSIS — Z23 Encounter for immunization: Secondary | ICD-10-CM

## 2019-06-06 NOTE — Progress Notes (Signed)
   Covid-19 Vaccination Clinic  Name:  Destiny Gardner    MRN: [id number] DOB: 11/21/1965  06/06/2019  Destiny Gardner was observed post Covid-19 immunization for 15 minutes without incident. She was provided with Vaccine Information Sheet and instruction to access the V-Safe system.   Destiny Gardner was instructed to call 911 with any severe reactions post vaccine: Marland Kitchen Difficulty breathing  . Swelling of face and throat  . A fast heartbeat  . A bad rash all over body  . Dizziness and weakness   Immunizations Administered    Name Date Dose VIS Date Route   Pfizer COVID-19 Vaccine 06/06/2019 10:25 AM 0.3 mL 02/15/2019 Intramuscular   Manufacturer: [employer], [employer]   Lot: [id number]   NDC: [address]-1

## 2019-07-01 ENCOUNTER — Ambulatory Visit: Payer: BC Managed Care – PPO | Attending: Internal Medicine

## 2019-07-01 DIAGNOSIS — Z23 Encounter for immunization: Secondary | ICD-10-CM

## 2019-07-01 NOTE — Progress Notes (Signed)
   Covid-19 Vaccination Clinic  Name:  THEKLA COLBORN    MRN: [id number] DOB: 1966-02-28  07/01/2019  Ms. Newberry was observed post Covid-19 immunization for 15 minutes without incident. She was provided with Vaccine Information Sheet and instruction to access the V-Safe system.   Ms. Carbary was instructed to call 911 with any severe reactions post vaccine: Marland Kitchen Difficulty breathing  . Swelling of face and throat  . A fast heartbeat  . A bad rash all over body  . Dizziness and weakness   Immunizations Administered    Name Date Dose VIS Date Route   Pfizer COVID-19 Vaccine 07/01/2019 10:25 AM 0.3 mL 05/01/2018 Intramuscular   Manufacturer: [employer], [employer]   Lot: [id number]   NDC: [address]-7

## 2019-07-22 ENCOUNTER — Ambulatory Visit: Payer: BC Managed Care – PPO

## 2019-11-13 LAB — HM COLONOSCOPY

## 2020-03-20 ENCOUNTER — Other Ambulatory Visit: Payer: Self-pay | Admitting: Internal Medicine

## 2020-03-20 DIAGNOSIS — E7800 Pure hypercholesterolemia, unspecified: Secondary | ICD-10-CM

## 2020-04-07 ENCOUNTER — Ambulatory Visit
Admission: RE | Admit: 2020-04-07 | Discharge: 2020-04-07 | Disposition: A | Discharge status: Home / Self Care | Payer: BC Managed Care – PPO | Source: Ambulatory Visit | Attending: Internal Medicine | Admitting: Internal Medicine

## 2020-04-07 DIAGNOSIS — E7800 Pure hypercholesterolemia, unspecified: Secondary | ICD-10-CM

## 2020-06-04 ENCOUNTER — Other Ambulatory Visit: Payer: Self-pay

## 2020-06-04 ENCOUNTER — Ambulatory Visit: Payer: BC Managed Care – PPO | Admitting: Podiatry

## 2020-06-04 ENCOUNTER — Encounter: Payer: Self-pay | Admitting: Podiatry

## 2020-06-04 ENCOUNTER — Ambulatory Visit (INDEPENDENT_AMBULATORY_CARE_PROVIDER_SITE_OTHER): Payer: BC Managed Care – PPO

## 2020-06-04 DIAGNOSIS — M2011 Hallux valgus (acquired), right foot: Secondary | ICD-10-CM

## 2020-06-04 DIAGNOSIS — M21622 Bunionette of left foot: Secondary | ICD-10-CM | POA: Diagnosis not present

## 2020-06-04 DIAGNOSIS — M21611 Bunion of right foot: Secondary | ICD-10-CM

## 2020-06-04 NOTE — Progress Notes (Signed)
  Subjective:  Patient ID: Destiny Gardner, female    DOB: 02-23-1966,  MRN: [id number]  Chief Complaint  Patient presents with  . Bunions    Pain across the side of the big toe and across the top of the foot     55 y.o. female presents with the above complaint. History confirmed with patient.  This is worse on the right foot over the great toe joint and bunion and over the fifth toe on the left side  Objective:  Physical Exam: warm, good capillary refill, no trophic changes or ulcerative lesions, normal DP and PT pulses and normal sensory exam. Left Foot: Tailor's bunion deformity Right Foot: Hallux valgus with hypermobility of the first ray  Radiographs: X-ray of both feet: Left foot tailor's bunion deformity, right foot hallux valgus pronation of the first metatarsal and displacement of the sesamoids Assessment:   1. Hallux valgus with bunions, right   2. Tailor's bunionette, left      Plan:  Patient was evaluated and treated and all questions answered.  Discussed etiology and treat options for both tailor's bunions and bunions in detail with the patient and how this relates to overall foot type.  We discussed surgical and nonsurgical treatment options including wider shoe gear, padding and offloading.  We discussed surgical correction, I think she likely would need a Lapidus type procedure to stabilize the first ray even though her IM angle is not very significant her deviation and sesamoid pronation as well as hypermobility of the first ray is quite profound.  Discussed the risk, benefits and potential complications of this.  Currently is is only intermittently bothersome for her and she understands what she needs to do to alleviate this if it becomes worse she will return for surgical planning.  Return if symptoms worsen or fail to improve.

## 2020-06-04 NOTE — Patient Instructions (Signed)
The Procedure we discussed is a Lapidus procedure

## 2021-08-11 ENCOUNTER — Encounter: Payer: Self-pay | Admitting: Family Medicine

## 2021-08-11 ENCOUNTER — Ambulatory Visit: Payer: Self-pay

## 2021-08-11 ENCOUNTER — Ambulatory Visit (INDEPENDENT_AMBULATORY_CARE_PROVIDER_SITE_OTHER): Payer: BC Managed Care – PPO | Admitting: Family Medicine

## 2021-08-11 VITALS — BP 124/82 | HR 77 | Ht 68.5 in | Wt 143.8 lb

## 2021-08-11 DIAGNOSIS — G8929 Other chronic pain: Secondary | ICD-10-CM

## 2021-08-11 DIAGNOSIS — M25521 Pain in right elbow: Secondary | ICD-10-CM

## 2021-08-11 DIAGNOSIS — M25511 Pain in right shoulder: Secondary | ICD-10-CM

## 2021-08-11 MED ORDER — NITROGLYCERIN 0.2 MG/HR TD PT24: MEDICATED_PATCH | TRANSDERMAL | 1 refills | Status: DC

## 2021-08-11 NOTE — Patient Instructions (Addendum)
Thank you for coming in today.   I've referred you to Physical Therapy.  Let us know if you don't hear from them in one week.   Check back 6 weeks

## 2021-08-11 NOTE — Progress Notes (Signed)
I, Philbert Riser, LAT, ATC acting as a scribe for Clementeen Graham, MD.  Subjective:    CC: R arm pain  HPI: Pt is a 56 y/o female c/o R arm pain. Pt has a hx of R RC tear and was seen previously at [facility] and has completed PT in the past. Today, Pt reports R shoulder pain ongoing for 2-3 years. MRI does reveal supraspinatus tendon tear. Pt locates pain to the anterior, superior, and deep within the [facility] joint.  Neck pain: no Radiates: not sure UE numbness/tingling: yes- thumb UE weakness: no Aggravates: sleeping Treatments tried: wrist guard  Pt also c/o R elbow pain 2-3 years. Pt was doing a lot of repetitive motions, painting, cooking, etc. Pt locates pain to the medial aspect of the R elbow  Additionally, pt reports new R hand pain w/ paresthesia x a couple months. Pt locates pain to the 1st-2nd and slightly 3rd fingers  Pertinent review of Systems: No fevers or chills  Relevant historical information: Bowel disease   Objective:    Vitals:   08/11/21 1244  BP: 124/82  Pulse: 77  SpO2: 100%   General: Well Developed, well nourished, and in no acute distress.   MSK: Right shoulder: Normal. Mildly tender palpation superior lateral shoulder. Normal range of motion. Strength abduction 4/5 intact external and internal rotation strength. Positive Hawkins and Neer's test. Positive empty can test. Negative Yergason's and speeds test.  Right elbow: Normal appearing Tender palpation medial epicondyle Normal range of motion.  Intact strength. Some pain with resisted wrist flexion and resisted pronation.  Right wrist normal appearing Positive Tinel's and Phalen's test of carpal tunnel.  Grip strength is intact.  Lab and Radiology Results  Diagnostic Limited MSK Ultrasound of: Right shoulder elbow and wrist Right shoulder: Calcific changes of distal supraspinatus tendon consistent with chronic calcific tendinopathy.  Mild subacromial bursitis.  Otherwise  normal-appearing Right elbow: No significant abnormality of medial epicondyles. Right carpal tunnel: Significantly enlarged median nerve measuring 17.8 mm cross-sectional area consistent with mild to moderate carpal tunnel syndrome. 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 Right elbow largely normal-appearing and medial epicondyles. Carpal tunnel syndrome rated mild to moderate     Impression and Recommendations:    Assessment and Plan: 56 y.o. female with  Chronic right shoulder and elbow pain and carpal tunnel syndrome.  Shoulder pain thought to be due to chronic rotator cuff tendinitis.  She did have a partial tear seen on MRI from [facility] 2020.  No evidence of current significant tear on ultrasound today.  She is a good candidate for retrial of PT.  Plan for physical therapy trial and recheck back in 6 weeks. We will also use nitroglycerin patch protocol.  Right elbow pain: Thought to be due to medial epicondylitis.  Home exercise program and physical therapy referral.  Right hand paresthesias thought to be due to carpal tunnel syndrome.  Carpal tunnel wrist brace at bedtime.  Recheck in 6 weeks.Marland Kitchen  PDMP not reviewed this encounter. Orders Placed This Encounter  Procedures   [place] LIMITED JOINT SPACE STRUCTURES UP RIGHT(NO LINKED CHARGES)    Order Specific Question:   Reason for Exam (SYMPTOM  OR DIAGNOSIS REQUIRED)    Answer:   right shoulder pain    Order Specific Question:   Preferred imaging location?    Answer:   [facility] referral to Physical Therapy    Referral Priority:   Routine  Referral Type:   Physical Medicine    Referral Reason:   Specialty Services Required    Requested Specialty:   Physical Therapy    Number of Visits Requested:   1   Meds ordered this encounter  Medications   nitroGLYCERIN (NITRODUR - DOSED IN MG/24 HR) 0.2 mg/hr patch    Sig: Apply 1/4  patch daily to tendon for tendonitis.    Dispense:  30 patch    Refill:  1    Discussed warning signs or symptoms. Please see discharge instructions. Patient expresses understanding.   The above documentation has been reviewed and is accurate and complete Clementeen Graham, M.D.

## 2021-09-20 ENCOUNTER — Ambulatory Visit (INDEPENDENT_AMBULATORY_CARE_PROVIDER_SITE_OTHER): Payer: BC Managed Care – PPO | Admitting: Rehabilitative and Restorative Service Providers"

## 2021-09-20 ENCOUNTER — Encounter: Payer: Self-pay | Admitting: Rehabilitative and Restorative Service Providers"

## 2021-09-20 ENCOUNTER — Other Ambulatory Visit: Payer: Self-pay

## 2021-09-20 DIAGNOSIS — M25521 Pain in right elbow: Secondary | ICD-10-CM | POA: Diagnosis not present

## 2021-09-20 DIAGNOSIS — M25511 Pain in right shoulder: Secondary | ICD-10-CM

## 2021-09-20 DIAGNOSIS — M6281 Muscle weakness (generalized): Secondary | ICD-10-CM | POA: Diagnosis not present

## 2021-09-20 DIAGNOSIS — G8929 Other chronic pain: Secondary | ICD-10-CM | POA: Diagnosis not present

## 2021-09-20 NOTE — Therapy (Signed)
OUTPATIENT PHYSICAL THERAPY EVALUATION   Patient Name: Destiny Gardner MRN: [id number] DOB:1966-01-14, 56 y.o., female Today's Date: 09/20/2021   PT End of Session - 09/20/21 1255     Visit Number 1    Number of Visits 20    Date for PT Re-Evaluation 11/29/21    PT Start Time 1301    PT Stop Time 1346    PT Time Calculation (min) 45 min    Activity Tolerance Patient tolerated treatment well    Behavior During Therapy [facility] for tasks assessed/performed             Past Medical History:  Diagnosis Date   Endometriosis    Inflammatory bowel disease    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,now with constipation 2010   Past Surgical History:  Procedure Laterality Date   COLONOSCOPY  3/11   TCS/EGD :noraml stomach,colon,and duodenum   ESOPHAGOGASTRODUODENOSCOPY  3/11   with TCS   LAPAROTOMY  1999   endometriosis   MYRINGOTOMY     Tubes,Tympanoplasty   RHINOPLASTY     Tonillectomy     Patient Active Problem List   Diagnosis Date Noted   Cough 05/18/2015   ABDOMINAL PAIN 05/26/2009   CHANGE IN BOWELS 01/27/2009   CONSTIPATION, CHRONIC 12/11/2008   ENDOMETRIOSIS 12/11/2008   IRRITABLE BOWEL SYNDROME, HX OF 12/11/2008    PCP: Bennie Pierini  REFERRING PROVIDER: Rodolph Bong, MD  REFERRING DIAG: [phone] (ICD-10-CM) - Chronic right shoulder pain M25.521,G89.29 (ICD-10-CM) - Chronic elbow pain, right  THERAPY DIAG:  Chronic right shoulder pain  Pain in right elbow  Muscle weakness (generalized)  Rationale for Evaluation and Treatment Rehabilitation  ONSET DATE: 03/08/2019 (2 years +)  SUBJECTIVE:                                                                                                                                                                                      SUBJECTIVE STATEMENT: Complaints of Rt shoulder, elbow pain for several years, more recent onset of Rt hand complaints in last few months per MD report review.   Pt indicated  complaints in Rt shoulder had been treated in past with possible surgical discussion and she sought another opinion.  Pt indicated complaints with Rt shoulder c sleeping and also feels dull ache.   Pt also indicated complaints c increased activity in Rt medial elbow.  Complaints of swollen at times in elbow.   Pt indicated patches for shoulder but not sure if it helped much.   Pt indicated complaints related to Rt wrist that noticed in increased activity (overhead).    PERTINENT HISTORY: History of Rt rotator cuff tear with PT  per Chart review.    PAIN:  NPRS scale: Rt shoulder: current .5/10, at worst 8-9/10  Rt elbow:   Pain location: Rt shoulder (anterior shoulder) Alden Hipp - arm Pain description: Rt shoulder  - dull ache mostly,  Rt elbow - tenderness, sharp Aggravating factors: sleeping on arm, lifting/reaching overhead, increased general activity, cooking Relieving factors: reducing activity, icing , OTC medicine  PRECAUTIONS: None  WEIGHT BEARING RESTRICTIONS No  FALLS:  Has patient fallen in last 6 months? No  LIVING ENVIRONMENT: Lives with: lives with their family  OCCUPATION: Stay at home  PLOF: Independent, Rt hand dominant, cooking activity   PATIENT GOALS  Be able to work out with strengthening, less pain  OBJECTIVE:   DIAGNOSTIC FINDINGS:  09/20/2021 : from MD visit note: MRI does reveal supraspinatus tendon tear  PATIENT SURVEYS:  09/20/2021 FOTO intake: 60   predicted:  69  COGNITION: 09/20/2021 Overall cognitive status: Within functional limits for tasks assessed     SENSATION: 09/20/2021 Light touch for BUE equal and unremarkable for dermatomes  POSTURE: 09/20/2021 Rounded   UPPER EXTREMITY MMT:   MMT Right 09/20/2021 Left 09/20/2021  Shoulder flexion 4/5 5/5  Shoulder extension    Shoulder abduction 5/5 5/5  Shoulder adduction    Shoulder internal rotation 5/5 5/5  Shoulder external rotation 4/5 5/5  Elbow flexion 5/5 5/5  Elbow extension 5/5 5/5   Wrist flexion 5/5 5/5  Wrist extension 5/5 5/5  Wrist ulnar deviation    Wrist radial deviation    Wrist pronation 4/5 5/5  Wrist supination 5/5 5/5  (Blank rows = not tested)  UPPER EXTREMITY ROM:   09/20/2021:  no specific joint limitations noted or pain c active movement bilaterally for shoulder flexion, abduction, er, ir.  Elbow flexion/extension supination or pronation.   MMT Right 09/20/2021 Left 09/20/2021  Shoulder flexion    Shoulder extension    Shoulder abduction    Shoulder adduction    Shoulder internal rotation    Shoulder external rotation    Middle trapezius    Lower trapezius    Elbow flexion    Elbow extension    Wrist flexion    Wrist extension    Wrist ulnar deviation    Wrist radial deviation    Wrist pronation    Wrist supination    Grip strength (lbs)    (Blank rows = not tested)  SHOULDER SPECIAL TESTS: 09/20/2021 : (-) painful arc, lift off, drop arm on Rt shoulder    JOINT MOBILITY TESTING:  09/20/2021 no specific limitations noted in RUE.   PALPATION:  09/20/2021 mild tenderness c trigger points noted in Rt infraspinatus, teres minor, Rt anterior deltoid, Rt wrist flexor group throughout   TODAY'S TREATMENT:  09/20/2021:  Therex:   HEP instruction/performance c cues for techniques, handout provided.  Trial set performed of each for comprehension and symptom assessment.  See below for exercise list.   Manual   Compression c ER movement to Rt infraspinatus trigger points   PATIENT EDUCATION: 09/20/2021 Education details: HEP, POC Person educated: Patient Education method: Consulting civil engineer, Demonstration, Verbal cues, and Handouts Education comprehension: verbalized understanding, returned demonstration, and verbal cues required   HOME EXERCISE PROGRAM: Access Code: [email or web address] URL: [email or web address] Date: 09/20/2021 Prepared by: Scot Jun  Exercises - Shoulder External Rotation with Anchored Resistance (Mirrored)  -  1-2 x daily - 7 x weekly - 3 sets - 10 reps - Standing Shoulder Posterior Capsule Stretch (Mirrored)  - 2-3 x daily - 7 x  weekly - 1 sets - 5 reps - 15-30 hold - Standing Wrist Extension Stretch  - 2-3 x daily - 7 x weekly - 1 sets - 5 reps - 15-30 hold - Seated Scapular Retraction  - 3-5 x daily - 7 x weekly - 1 sets - 10 reps - 3-5 hold - Seated Eccentric Wrist Flexion with Dumbbell  - 1-2 x daily - 7 x weekly - 3 sets - 10 reps  ASSESSMENT:  CLINICAL IMPRESSION: Patient is a 56 y.o. who comes to clinic with complaints of Rt shoulder/elbow/arm pain with mobility, strength and movement coordination deficits that impair their ability to perform usual daily and recreational functional activities without increase difficulty/symptoms at this time.  Patient to benefit from skilled PT services to address impairments and limitations to improve to previous level of function without restriction secondary to condition.    OBJECTIVE IMPAIRMENTS decreased activity tolerance, decreased coordination, decreased endurance, decreased strength, increased fascial restrictions, impaired perceived functional ability, increased muscle spasms, impaired flexibility, impaired UE functional use, improper body mechanics, postural dysfunction, and pain.   ACTIVITY LIMITATIONS carrying, lifting, sleeping, bathing, toileting, dressing, self feeding, reach over head, and hygiene/grooming  PARTICIPATION LIMITATIONS: meal prep, cleaning, laundry, interpersonal relationship, driving, shopping, and community activity  PERSONAL FACTORS  No specific personal factors  are affecting patient's functional outcome.   REHAB POTENTIAL: Good  CLINICAL DECISION MAKING: Stable/uncomplicated  EVALUATION COMPLEXITY: Low   GOALS: Goals reviewed with patient? Yes  Short term PT Goals (target date for Short term goals are 3 weeks 10/11/2021) Patient will demonstrate independent use of home exercise program to maintain progress from in  clinic treatments. Goal status: New   Long term PT goals (target dates for all long term goals are 10 weeks  11/29/2021 )  1. Patient will demonstrate/report pain at worst less than or equal to 2/10 to facilitate minimal limitation in daily activity secondary to pain symptoms. Goal status: New  2. Patient will demonstrate independent use of home exercise program to facilitate ability to maintain/progress functional gains from skilled physical therapy services. Goal status: New  3. Patient will demonstrate FOTO outcome > or = 69 % to indicate reduced disability due to condition. Goal status: New  4.  Patient will demonstrate Rt UE MMT 5/5 throughout to facilitate usual lifting, carrying in functional activity to PLOF s limitation. Goal status: New  5.  Patient will demonstrate/report ability to perform usual household cooking, cleaning, and self care at [facility] s limitation.    Goal status: New  6.  Patient will demonstrate/report ability to sleep s restriction.   Goal status: New    PLAN: PT FREQUENCY: 1-2x/week  PT DURATION: 10 weeks  PLANNED INTERVENTIONS: Therapeutic exercises, Therapeutic activity, Neuro Muscular re-education, Balance training, Gait training, Patient/Family education, Joint mobilization, Stair training, DME instructions, Dry Needling, Electrical stimulation, Cryotherapy, Moist heat, Taping, Ultrasound, Ionotophoresis 4mg /ml Dexamethasone, and Manual therapy.  All included unless contraindicated  PLAN FOR NEXT SESSION: Review existing HEP/response.   Possible dry needling/trigger point release techniques.    , PT, DPT, OCS, ATC 09/20/21  2:16 PM

## 2021-09-23 ENCOUNTER — Ambulatory Visit: Payer: No Typology Code available for payment source | Admitting: Family Medicine

## 2021-09-27 ENCOUNTER — Ambulatory Visit: Payer: BC Managed Care – PPO | Admitting: Rehabilitative and Restorative Service Providers"

## 2021-09-27 ENCOUNTER — Encounter: Payer: Self-pay | Admitting: Rehabilitative and Restorative Service Providers"

## 2021-09-27 DIAGNOSIS — M6281 Muscle weakness (generalized): Secondary | ICD-10-CM

## 2021-09-27 DIAGNOSIS — M25521 Pain in right elbow: Secondary | ICD-10-CM | POA: Diagnosis not present

## 2021-09-27 DIAGNOSIS — G8929 Other chronic pain: Secondary | ICD-10-CM | POA: Diagnosis not present

## 2021-09-27 DIAGNOSIS — M25511 Pain in right shoulder: Secondary | ICD-10-CM

## 2021-09-27 NOTE — Therapy (Signed)
OUTPATIENT PHYSICAL THERAPY TREATMENT NOTE   Patient Name: Destiny Gardner MRN: [id number] DOB:[birth year], 56 y.o., female Today's Date: 09/27/2021  PCP: Bennie Pierini   REFERRING PROVIDER: Rodolph Bong, MD  END OF SESSION:   PT End of Session - 09/27/21 1430     Visit Number 2    Number of Visits 20    Date for PT Re-Evaluation 11/29/21    PT Start Time 1430    PT Stop Time 1513    PT Time Calculation (min) 43 min    Activity Tolerance Patient tolerated treatment well    Behavior During Therapy [facility] for tasks assessed/performed             Past Medical History:  Diagnosis Date   Endometriosis    Inflammatory bowel disease    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,now with constipation 2010   Past Surgical History:  Procedure Laterality Date   COLONOSCOPY  3/11   TCS/EGD :noraml stomach,colon,and duodenum   ESOPHAGOGASTRODUODENOSCOPY  3/11   with TCS   LAPAROTOMY  1999   endometriosis   MYRINGOTOMY     Tubes,Tympanoplasty   RHINOPLASTY     Tonillectomy     Patient Active Problem List   Diagnosis Date Noted   Cough 05/18/2015   ABDOMINAL PAIN 05/26/2009   CHANGE IN BOWELS 01/27/2009   CONSTIPATION, CHRONIC 12/11/2008   ENDOMETRIOSIS 12/11/2008   IRRITABLE BOWEL SYNDROME, HX OF 12/11/2008    REFERRING DIAG: M25.511,G89.29 (ICD-10-CM) - Chronic right shoulder pain M25.521,G89.29 (ICD-10-CM) - Chronic elbow pain, right  ONSET DATE: 03/08/2019 (2 years +)  THERAPY DIAG:  Chronic right shoulder pain  Pain in right elbow  Muscle weakness (generalized)  Rationale for Evaluation and Treatment Rehabilitation  PERTINENT HISTORY: History of Rt rotator cuff tear with PT per Chart review.    PRECAUTIONS: None  SUBJECTIVE: Pt indicated no pain today upon arrival.  Pt indicated no worse but not better overall.   PAIN:  NPRS scale: 0/10 upon arrival.  Pain location: Rt shoulder (anterior shoulder) Barnie Alderman - arm Pain description: Rt shoulder  - dull ache  mostly,  Rt elbow - tenderness, sharp Aggravating factors: sleeping on arm, lifting/reaching overhead, increased general activity, cooking Relieving factors: reducing activity, icing , OTC medicine   OBJECTIVE:    DIAGNOSTIC FINDINGS:  09/20/2021 : from MD visit note: MRI does reveal supraspinatus tendon tear   PATIENT SURVEYS:  09/20/2021 FOTO intake: 60   predicted:  69   COGNITION: 09/20/2021 Overall cognitive status: Within functional limits for tasks assessed                                     SENSATION: 09/20/2021 Light touch for BUE equal and unremarkable for dermatomes   POSTURE: 09/20/2021 Rounded shoulders   UPPER EXTREMITY MMT:    MMT Right 09/20/2021 Left 09/20/2021 Right 09/27/2021  Shoulder flexion 4/5 5/5   Shoulder extension       Shoulder abduction 5/5 5/5   Shoulder adduction       Shoulder internal rotation 5/5 5/5   Shoulder external rotation 4/5 5/5 4/5 c mild pain upon arrival (5/5 post manual)  Elbow flexion 5/5 5/5   Elbow extension 5/5 5/5   Wrist flexion 5/5 5/5   Wrist extension 5/5 5/5   Wrist ulnar deviation       Wrist radial deviation       Wrist pronation 4/5  5/5   Wrist supination 5/5 5/5   (Blank rows = not tested)   UPPER EXTREMITY ROM:              09/20/2021:  no specific joint limitations noted or pain c active movement bilaterally for shoulder flexion, abduction, er, ir.  Elbow flexion/extension supination or pronation.    MMT Right 09/20/2021 Left 09/20/2021  Shoulder flexion      Shoulder extension      Shoulder abduction      Shoulder adduction      Shoulder internal rotation      Shoulder external rotation      Middle trapezius      Lower trapezius      Elbow flexion      Elbow extension      Wrist flexion      Wrist extension      Wrist ulnar deviation      Wrist radial deviation      Wrist pronation      Wrist supination      Grip strength (lbs)      (Blank rows = not tested)   SHOULDER SPECIAL TESTS: 09/20/2021  : (-) painful arc, lift off, drop arm on Rt shoulder      JOINT MOBILITY TESTING:  09/20/2021 no specific limitations noted in RUE.    PALPATION:  09/20/2021 mild tenderness c trigger points noted in Rt infraspinatus, teres minor, Rt anterior deltoid, Rt wrist flexor group throughout              TODAY'S TREATMENT:  09/27/2021:  Therex: Review of existing HEP c cues for techniques Prone scapular retraction 5 sec hold x 10 bilateral Prone scapular retraction c GH ext 5 sec hold x 10 bilateral Prone on elbows serratus press hold 5 sec x 10   Standing Rt shoulder ER c towel at side 2 x 10    Manual:  Compression to Rt infraspinatus c Active ER.  Percussive device to Rt infraspinatus trigger points   09/20/2021:    Therex:             HEP instruction/performance c cues for techniques, handout provided.  Trial set performed of each for comprehension and symptom assessment.  See below for exercise list.      Manual             Compression c ER movement to Rt infraspinatus trigger points     PATIENT EDUCATION: 09/20/2021 Education details: HEP, POC Person educated: Patient Education method: Programmer, multimedia, Demonstration, Verbal cues, and Handouts Education comprehension: verbalized understanding, returned demonstration, and verbal cues required     HOME EXERCISE PROGRAM: Access Code: [email or web address] URL: [email or web address] Date: 09/27/2021 Prepared by: Chyrel Masson  Exercises - Shoulder External Rotation with Anchored Resistance (Mirrored)  - 1-2 x daily - 7 x weekly - 3 sets - 10 reps - Standing Shoulder Posterior Capsule Stretch (Mirrored)  - 2-3 x daily - 7 x weekly - 1 sets - 5 reps - 15-30 hold - Standing Wrist Extension Stretch  - 2-3 x daily - 7 x weekly - 1 sets - 5 reps - 15-30 hold - Seated Scapular Retraction  - 3-5 x daily - 7 x weekly - 1 sets - 10 reps - 3-5 hold - Seated Eccentric Wrist Flexion with Dumbbell  - 1-2 x daily - 7 x weekly - 3 sets - 10  reps - Prone Scapular Protraction Retraction AROM on Forearms  - 1-2 x daily - 7 x  weekly - 1 sets - 10 reps - 5 hold - Prone Scapular Retraction  - 1-2 x daily - 7 x weekly - 1 sets - 10 reps - 5 hold - Prone Scapular Slide with Shoulder Extension  - 1-2 x daily - 7 x weekly - 1 sets - 10 reps - 5 hold   ASSESSMENT:   CLINICAL IMPRESSION: Obvious increased tenderness and trigger points in Rt infraspinatus vs. Lt.   Strength deficits c pain in ER on Rt still present.  Scapular mobility and control improvements to help improve Rt shoulder mobility and control as well and most likely improve symptoms response.      OBJECTIVE IMPAIRMENTS decreased activity tolerance, decreased coordination, decreased endurance, decreased strength, increased fascial restrictions, impaired perceived functional ability, increased muscle spasms, impaired flexibility, impaired UE functional use, improper body mechanics, postural dysfunction, and pain.    ACTIVITY LIMITATIONS carrying, lifting, sleeping, bathing, toileting, dressing, self feeding, reach over head, and hygiene/grooming   PARTICIPATION LIMITATIONS: meal prep, cleaning, laundry, interpersonal relationship, driving, shopping, and community activity   PERSONAL FACTORS  No specific personal factors  are affecting patient's functional outcome.    REHAB POTENTIAL: Good   CLINICAL DECISION MAKING: Stable/uncomplicated   EVALUATION COMPLEXITY: Low     GOALS: Goals reviewed with patient? Yes   Short term PT Goals (target date for Short term goals are 3 weeks 10/11/2021) Patient will demonstrate independent use of home exercise program to maintain progress from in clinic treatments. Goal status: on going - assessed 09/27/2021   Long term PT goals (target dates for all long term goals are 10 weeks  11/29/2021 )   1. Patient will demonstrate/report pain at worst less than or equal to 2/10 to facilitate minimal limitation in daily activity secondary to pain  symptoms. Goal status: New   2. Patient will demonstrate independent use of home exercise program to facilitate ability to maintain/progress functional gains from skilled physical therapy services. Goal status: New   3. Patient will demonstrate FOTO outcome > or = 69 % to indicate reduced disability due to condition. Goal status: New   4.  Patient will demonstrate Rt UE MMT 5/5 throughout to facilitate usual lifting, carrying in functional activity to PLOF s limitation. Goal status: New   5.  Patient will demonstrate/report ability to perform usual household cooking, cleaning, and self care at [facility] s limitation.    Goal status: New   6.  Patient will demonstrate/report ability to sleep s restriction.   Goal status: New       PLAN: PT FREQUENCY: 1-2x/week   PT DURATION: 10 weeks   PLANNED INTERVENTIONS: Therapeutic exercises, Therapeutic activity, Neuro Muscular re-education, Balance training, Gait training, Patient/Family education, Joint mobilization, Stair training, DME instructions, Dry Needling, Electrical stimulation, Cryotherapy, Moist heat, Taping, Ultrasound, Ionotophoresis 4mg /ml Dexamethasone, and Manual therapy.  All included unless contraindicated   PLAN FOR NEXT SESSION: Progressive scapular and rotator cuff strengthening.  Manual prn.    , PT, DPT, OCS, ATC 09/27/21  3:14 PM

## 2021-10-04 ENCOUNTER — Encounter: Payer: BC Managed Care – PPO | Admitting: Rehabilitative and Restorative Service Providers"

## 2021-10-04 NOTE — Progress Notes (Signed)
This encounter was created in error - please disregard.

## 2021-10-11 ENCOUNTER — Ambulatory Visit: Payer: BC Managed Care – PPO | Admitting: Family Medicine

## 2021-10-11 ENCOUNTER — Ambulatory Visit (INDEPENDENT_AMBULATORY_CARE_PROVIDER_SITE_OTHER): Payer: BC Managed Care – PPO | Admitting: Rehabilitative and Restorative Service Providers"

## 2021-10-11 ENCOUNTER — Encounter: Payer: Self-pay | Admitting: Rehabilitative and Restorative Service Providers"

## 2021-10-11 ENCOUNTER — Ambulatory Visit: Payer: Self-pay

## 2021-10-11 VITALS — BP 138/82 | HR 72 | Ht 68.5 in | Wt 143.8 lb

## 2021-10-11 DIAGNOSIS — M25521 Pain in right elbow: Secondary | ICD-10-CM

## 2021-10-11 DIAGNOSIS — M25511 Pain in right shoulder: Secondary | ICD-10-CM

## 2021-10-11 DIAGNOSIS — M6281 Muscle weakness (generalized): Secondary | ICD-10-CM

## 2021-10-11 DIAGNOSIS — G8929 Other chronic pain: Secondary | ICD-10-CM

## 2021-10-11 DIAGNOSIS — G5601 Carpal tunnel syndrome, right upper limb: Secondary | ICD-10-CM | POA: Diagnosis not present

## 2021-10-11 NOTE — Progress Notes (Signed)
   I, Philbert Riser, LAT, ATC acting as a scribe for Clementeen Graham, MD.  Destiny Gardner is a 56 y.o. female who presents to [facility] today for f/u R shoulder, R elbow pain, and R CTS. Pt was last seen by Dr. Denyse Amass on 08/11/21 and was advised to wear a carpal tunnel wrist brace at bedtime, nitro patches, and was referred to PT, completing 3 visits. Today, pt reports R shoulder and elbow are about the same. Pt has been compliant in wear the wrist splint at night, which she feels has been help. Pt cont to have numbness into her R hand w/ certain activities; driving, bike riding, brushing her teeth, and pulling her hair up.   Pertinent review of systems: No fevers or chills  Relevant historical information: IBS   Exam:  BP 138/82   Pulse 72   Ht 5' 8.5" (1.74 m)   Wt 143 lb 12.8 oz (65.2 kg)   SpO2 99%   BMI 21.55 kg/m  General: Well Developed, well nourished, and in no acute distress.   MSK: Right shoulder: Normal-appearing Normal motion pain with abduction.  Palpable click with range of motion. Intact strength. Positive Hawkins and Neer's test. Positive O'Brien's test. Positive clunk test. Negative Yergason's and speeds test.  Right elbow: Normal appearing Tender palpation medial epicondyle.  Pain with resisted wrist flexion.  Right wrist: Normal appearing Positive Tinel's the carpal tunnel.     Assessment and Plan: 56 y.o. female with right shoulder pain.  Pain thought to be due to rotator cuff tendinopathy and likely a labrum tear.  She did have an MRI from 2020 that showed degenerative labrum changes as well as rotator cuff tear and rotator cuff tendinitis.  She has had 2 physical therapy sessions.  Recommend completing a course of physical therapy and if still not better would recommend MRI arthrogram given her mechanical symptoms.  Recheck in 6 weeks or proceed directly to MRI arthrogram.  Right medial elbow pain.  Medial epicondylitis continue  conservative management strategies.  Right carpal tunnel syndrome: Continue night splint and conservative management.  If needed neck step would be injection or nerve conduction study.  She is not excited about the idea of an injection.  All 3 problems are chronic with either stable or worsening.  PDMP not reviewed this encounter. No orders of the defined types were placed in this encounter.  No orders of the defined types were placed in this encounter.    Discussed warning signs or symptoms. Please see discharge instructions. Patient expresses understanding.   The above documentation has been reviewed and is accurate and complete Clementeen Graham, M.D.

## 2021-10-11 NOTE — Patient Instructions (Addendum)
Thank you for coming in today.   Continue Physical Therapy.  Check back in 6 weeks or send [place] a MyChart message and we can proceed to MRI.

## 2021-10-11 NOTE — Therapy (Signed)
OUTPATIENT PHYSICAL THERAPY TREATMENT NOTE   Patient Name: Destiny Gardner MRN: [id number] DOB:Mar 23, 1965, 56 y.o., female Today's Date: 10/11/2021  PCP: Chevis Pretty   REFERRING PROVIDER: Gregor Hams, MD  END OF SESSION:   PT End of Session - 10/11/21 1109     Visit Number 3    Number of Visits 20    Date for PT Re-Evaluation 11/29/21    PT Start Time 1106    PT Stop Time 1144    PT Time Calculation (min) 38 min    Activity Tolerance Other (comment)   some limitation due to post manual soreness   Behavior During Therapy [facility] for tasks assessed/performed              Past Medical History:  Diagnosis Date   Endometriosis    Inflammatory bowel disease    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,now with constipation 2010   Past Surgical History:  Procedure Laterality Date   COLONOSCOPY  3/11   TCS/EGD :noraml stomach,colon,and duodenum   ESOPHAGOGASTRODUODENOSCOPY  3/11   with TCS   LAPAROTOMY  1999   endometriosis   MYRINGOTOMY     Tubes,Tympanoplasty   RHINOPLASTY     Tonillectomy     Patient Active Problem List   Diagnosis Date Noted   Cough 05/18/2015   ABDOMINAL PAIN 05/26/2009   CHANGE IN BOWELS 01/27/2009   CONSTIPATION, CHRONIC 12/11/2008   ENDOMETRIOSIS 12/11/2008   IRRITABLE BOWEL SYNDROME, HX OF 12/11/2008    REFERRING DIAG: M25.511,G89.29 (ICD-10-CM) - Chronic right shoulder pain M25.521,G89.29 (ICD-10-CM) - Chronic elbow pain, right  ONSET DATE: 03/08/2019 (2 years +)  THERAPY DIAG:  Chronic right shoulder pain  Pain in right elbow  Muscle weakness (generalized)  Rationale for Evaluation and Treatment Rehabilitation  PERTINENT HISTORY: History of Rt rotator cuff tear with PT per Chart review.    PRECAUTIONS: None  SUBJECTIVE: Pt indicated having 3/10 pain in Rt shoulder this morning.  Mentioned tennis ball has been helpful for use at home.  Pt indicated similar symptoms in shoulder/arm above.   PAIN:  NPRS scale: 0/10 upon  arrival.  Pain location: Rt shoulder (anterior shoulder) Alden Hipp - arm Pain description: Rt shoulder  - dull ache mostly,  Rt elbow - tenderness, sharp Aggravating factors: sleeping on arm, lifting/reaching overhead, increased general activity, cooking Relieving factors: reducing activity, icing , OTC medicine   OBJECTIVE:    DIAGNOSTIC FINDINGS:  09/20/2021 : from MD visit note: MRI does reveal supraspinatus tendon tear   PATIENT SURVEYS:  09/20/2021 FOTO intake: 60   predicted:  69   COGNITION: 09/20/2021 Overall cognitive status: Within functional limits for tasks assessed                                     SENSATION: 09/20/2021 Light touch for BUE equal and unremarkable for dermatomes   POSTURE: 09/20/2021 Rounded shoulders   UPPER EXTREMITY MMT:    MMT Right 09/20/2021 Left 09/20/2021 Right 09/27/2021 Right 10/11/2021  Shoulder flexion 4/5 5/5  4+/5  Shoulder extension        Shoulder abduction 5/5 5/5    Shoulder adduction        Shoulder internal rotation 5/5 5/5    Shoulder external rotation 4/5 5/5 4/5 c mild pain upon arrival (5/5 post manual) 4/5  Elbow flexion 5/5 5/5    Elbow extension 5/5 5/5    Wrist flexion 5/5  5/5    Wrist extension 5/5 5/5    Wrist ulnar deviation        Wrist radial deviation        Wrist pronation 4/5 5/5    Wrist supination 5/5 5/5    (Blank rows = not tested)   UPPER EXTREMITY ROM:              09/20/2021:  no specific joint limitations noted or pain c active movement bilaterally for shoulder flexion, abduction, er, ir.  Elbow flexion/extension supination or pronation.     Right 09/20/2021 Left 09/20/2021  Shoulder flexion      Shoulder extension      Shoulder abduction      Shoulder adduction      Shoulder internal rotation      Shoulder external rotation      Middle trapezius      Lower trapezius      Elbow flexion      Elbow extension      Wrist flexion      Wrist extension      Wrist ulnar deviation      Wrist radial  deviation      Wrist pronation      Wrist supination      Grip strength (lbs)      (Blank rows = not tested)   SHOULDER SPECIAL TESTS: 10/11/2021 : (-) painful arc, drop on Rt shoulder   09/20/2021 : (-) painful arc, lift off, drop arm on Rt shoulder      JOINT MOBILITY TESTING:  09/20/2021 no specific limitations noted in RUE.    PALPATION:  09/20/2021 mild tenderness c trigger points noted in Rt infraspinatus, teres minor, Rt anterior deltoid, Rt wrist flexor group throughout                TODAY'S TREATMENT:  10/11/2021:  Therex: Sidelying Rt shoulder ER x 15  Seated Rt shoulder cross body stretch 15 sec x 5 Seated Rt upper trap 15 sec x 3 stretch   Standing tband green rows 2 x 15 Standing tband green Gh ext 2 x 15  Standing wall push up c SA hold x 15  UBE fwd/back 1.5 mins each way lvl 1.5    Manual:  Compression to Rt infraspinatus  Dry needling: Rt infraspinatus twitch response noted c concordant Rt shoulder/upper arm.   Heat pad 5 mins Rt shoulder c stretching    09/27/2021:  Therex: Review of existing HEP c cues for techniques Prone scapular retraction 5 sec hold x 10 bilateral Prone scapular retraction c GH ext 5 sec hold x 10 bilateral Prone on elbows serratus press hold 5 sec x 10   Standing Rt shoulder ER c towel at side 2 x 10    Manual:  Compression to Rt infraspinatus c Active ER.  Percussive device to Rt infraspinatus trigger points   09/20/2021:    Therex:             HEP instruction/performance c cues for techniques, handout provided.  Trial set performed of each for comprehension and symptom assessment.  See below for exercise list.      Manual             Compression c ER movement to Rt infraspinatus trigger points     PATIENT EDUCATION: 09/20/2021 Education details: HEP, POC Person educated: Patient Education method: Consulting civil engineer, Demonstration, Verbal cues, and Handouts Education comprehension: verbalized understanding, returned  demonstration, and verbal cues required  HOME EXERCISE PROGRAM: Access Code: [email or web address] URL: [email or web address] Date: 10/11/2021 Prepared by: Scot Jun  Exercises - Shoulder External Rotation with Anchored Resistance (Mirrored)  - 1-2 x daily - 7 x weekly - 3 sets - 10 reps - Standing Shoulder Posterior Capsule Stretch (Mirrored)  - 2-3 x daily - 7 x weekly - 1 sets - 5 reps - 15-30 hold - Standing Wrist Extension Stretch  - 2-3 x daily - 7 x weekly - 1 sets - 5 reps - 15-30 hold - Seated Scapular Retraction  - 3-5 x daily - 7 x weekly - 1 sets - 10 reps - 3-5 hold - Seated Eccentric Wrist Flexion with Dumbbell  - 1-2 x daily - 7 x weekly - 3 sets - 10 reps - Prone Scapular Protraction Retraction AROM on Forearms  - 1-2 x daily - 7 x weekly - 1 sets - 10 reps - 5 hold - Prone Scapular Retraction  - 1-2 x daily - 7 x weekly - 1 sets - 10 reps - 5 hold - Prone Scapular Slide with Shoulder Extension  - 1-2 x daily - 7 x weekly - 1 sets - 10 reps - 5 hold - Standing Shoulder Row with Anchored Resistance  - 1-2 x daily - 7 x weekly - 3 sets - 10 reps - Single Arm Shoulder Extension with Anchored Resistance  - 1-2 x daily - 7 x weekly - 3 sets - 10 reps   ASSESSMENT:   CLINICAL IMPRESSION: Fair tolerance to trigger point treatment today.  Concordant symptoms were noted in Rt shoulder/upper arm during and immediately after performance, indicating connection to chief complaints.  Plan to reassess response next visit and use per Pt preference to treat symptoms.         OBJECTIVE IMPAIRMENTS decreased activity tolerance, decreased coordination, decreased endurance, decreased strength, increased fascial restrictions, impaired perceived functional ability, increased muscle spasms, impaired flexibility, impaired UE functional use, improper body mechanics, postural dysfunction, and pain.    ACTIVITY LIMITATIONS carrying, lifting, sleeping, bathing, toileting, dressing, self  feeding, reach over head, and hygiene/grooming   PARTICIPATION LIMITATIONS: meal prep, cleaning, laundry, interpersonal relationship, driving, shopping, and community activity   PERSONAL FACTORS  No specific personal factors  are affecting patient's functional outcome.    REHAB POTENTIAL: Good   CLINICAL DECISION MAKING: Stable/uncomplicated   EVALUATION COMPLEXITY: Low     GOALS: Goals reviewed with patient? Yes   Short term PT Goals (target date for Short term goals are 3 weeks 10/11/2021) Patient will demonstrate independent use of home exercise program to maintain progress from in clinic treatments. Goal status: MET 10/11/2021   Long term PT goals (target dates for all long term goals are 10 weeks  11/29/2021 )   1. Patient will demonstrate/report pain at worst less than or equal to 2/10 to facilitate minimal limitation in daily activity secondary to pain symptoms. Goal status: on going - assessed 10/11/2021   2. Patient will demonstrate independent use of home exercise program to facilitate ability to maintain/progress functional gains from skilled physical therapy services. Goal status: on going - assessed 10/11/2021   3. Patient will demonstrate FOTO outcome > or = 69 % to indicate reduced disability due to condition. Goal status: on going - assessed 10/11/2021   4.  Patient will demonstrate Rt UE MMT 5/5 throughout to facilitate usual lifting, carrying in functional activity to PLOF s limitation. Goal status: on going - assessed 10/11/2021   5.  Patient will demonstrate/report ability  to perform usual household cooking, cleaning, and self care at [facility] s limitation.    Goal status: on going - assessed 10/11/2021   6.  Patient will demonstrate/report ability to sleep s restriction.   Goal status: on going - assessed 10/11/2021       PLAN: PT FREQUENCY: 1-2x/week   PT DURATION: 10 weeks   PLANNED INTERVENTIONS: Therapeutic exercises, Therapeutic activity, Neuro Muscular  re-education, Balance training, Gait training, Patient/Family education, Joint mobilization, Stair training, DME instructions, Dry Needling, Electrical stimulation, Cryotherapy, Moist heat, Taping, Ultrasound, Ionotophoresis 68m/ml Dexamethasone, and Manual therapy.  All included unless contraindicated   PLAN FOR NEXT SESSION: Progressive scapular and rotator cuff strengthening, dry needling as desired by Pt.   FOTO assessment due to time since evaluation may be appropriate.    MScot Jun PT, DPT, OCS, ATC 10/11/21  11:44 AM

## 2021-10-18 ENCOUNTER — Encounter: Payer: Self-pay | Admitting: Rehabilitative and Restorative Service Providers"

## 2021-10-18 ENCOUNTER — Ambulatory Visit: Payer: BC Managed Care – PPO | Admitting: Rehabilitative and Restorative Service Providers"

## 2021-10-18 DIAGNOSIS — G8929 Other chronic pain: Secondary | ICD-10-CM

## 2021-10-18 DIAGNOSIS — M25521 Pain in right elbow: Secondary | ICD-10-CM

## 2021-10-18 DIAGNOSIS — M25511 Pain in right shoulder: Secondary | ICD-10-CM | POA: Diagnosis not present

## 2021-10-18 DIAGNOSIS — M6281 Muscle weakness (generalized): Secondary | ICD-10-CM | POA: Diagnosis not present

## 2021-10-18 NOTE — Therapy (Signed)
OUTPATIENT PHYSICAL THERAPY TREATMENT NOTE   Patient Name: Destiny Gardner MRN: [id number] DOB:[birth year], 56 y.o., female Today's Date: 10/18/2021  PCP: Chevis Pretty   REFERRING PROVIDER: Gregor Hams, MD  END OF SESSION:   PT End of Session - 10/18/21 1100     Visit Number 4    Number of Visits 20    Date for PT Re-Evaluation 11/29/21    PT Start Time 1100    PT Stop Time 1145    PT Time Calculation (min) 45 min    Activity Tolerance Patient tolerated treatment well    Behavior During Therapy [facility] for tasks assessed/performed               Past Medical History:  Diagnosis Date   Endometriosis    Inflammatory bowel disease    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,now with constipation 2010   Past Surgical History:  Procedure Laterality Date   COLONOSCOPY  3/11   TCS/EGD :noraml stomach,colon,and duodenum   ESOPHAGOGASTRODUODENOSCOPY  3/11   with TCS   LAPAROTOMY  1999   endometriosis   MYRINGOTOMY     Tubes,Tympanoplasty   RHINOPLASTY     Tonillectomy     Patient Active Problem List   Diagnosis Date Noted   Cough 05/18/2015   ABDOMINAL PAIN 05/26/2009   CHANGE IN BOWELS 01/27/2009   CONSTIPATION, CHRONIC 12/11/2008   ENDOMETRIOSIS 12/11/2008   IRRITABLE BOWEL SYNDROME, HX OF 12/11/2008    REFERRING DIAG: M25.511,G89.29 (ICD-10-CM) - Chronic right shoulder pain M25.521,G89.29 (ICD-10-CM) - Chronic elbow pain, right  ONSET DATE: 03/08/2019 (2 years +)  THERAPY DIAG:  Chronic right shoulder pain  Pain in right elbow  Muscle weakness (generalized)  Rationale for Evaluation and Treatment Rehabilitation  PERTINENT HISTORY: History of Rt rotator cuff tear with PT per Chart review.    PRECAUTIONS: None  SUBJECTIVE:  Pt indicated pain not worse than starting.  Twinges 1/10 today, up to 4/10 a few times over the few weeks. Unsure of what led to it.  Pt indicated occasional times waking with symptoms but improves quickly.   Pt indicated elbow can be  sharp pain at times but doesn't last long.   PAIN:  NPRS scale: 0/10 upon arrival.  Pain location: Rt shoulder (anterior shoulder) Alden Hipp - arm Pain description: Rt shoulder  - dull ache mostly,  Rt elbow - tenderness, sharp Aggravating factors: unsure Relieving factors: reducing activity, icing , OTC medicine   OBJECTIVE:    DIAGNOSTIC FINDINGS:  09/20/2021 : from MD visit note: MRI does reveal supraspinatus tendon tear   PATIENT SURVEYS:  10/18/2021:  FOTO update:   65   09/20/2021 FOTO intake: 60   predicted:  69   COGNITION: 09/20/2021 Overall cognitive status: Within functional limits for tasks assessed                                     SENSATION: 09/20/2021 Light touch for BUE equal and unremarkable for dermatomes   POSTURE: 09/20/2021 Rounded shoulders   UPPER EXTREMITY MMT:    MMT Right 09/20/2021 Left 09/20/2021 Right 09/27/2021 Right 10/11/2021 Right 10/18/2021  Shoulder flexion 4/5 5/5  4+/5 4+/5  Shoulder extension         Shoulder abduction 5/5 5/5   5/5  Shoulder adduction         Shoulder internal rotation 5/5 5/5     Shoulder external rotation 4/5 5/5  4/5 c mild pain upon arrival (5/5 post manual) 4/5 5/5  Elbow flexion 5/5 5/5     Elbow extension 5/5 5/5     Wrist flexion 5/5 5/5     Wrist extension 5/5 5/5     Wrist ulnar deviation         Wrist radial deviation         Wrist pronation 4/5 5/5     Wrist supination 5/5 5/5     (Blank rows = not tested)   UPPER EXTREMITY ROM:              09/20/2021:  no specific joint limitations noted or pain c active movement bilaterally for shoulder flexion, abduction, er, ir.  Elbow flexion/extension supination or pronation.     Right 09/20/2021 Left 09/20/2021  Shoulder flexion      Shoulder extension      Shoulder abduction      Shoulder adduction      Shoulder internal rotation      Shoulder external rotation      Middle trapezius      Lower trapezius      Elbow flexion      Elbow extension      Wrist  flexion      Wrist extension      Wrist ulnar deviation      Wrist radial deviation      Wrist pronation      Wrist supination      Grip strength (lbs)      (Blank rows = not tested)   SHOULDER SPECIAL TESTS: 10/11/2021 : (-) painful arc, drop on Rt shoulder   09/20/2021 : (-) painful arc, lift off, drop arm on Rt shoulder      JOINT MOBILITY TESTING:  09/20/2021 no specific limitations noted in RUE.    PALPATION:  09/20/2021 mild tenderness c trigger points noted in Rt infraspinatus, teres minor, Rt anterior deltoid, Rt wrist flexor group throughout               TODAY'S TREATMENT:   10/18/2021:  Therex: Seated bilateral scapular retraction c bilateral shoulder ER(robbery ex) 2 x 10 green band (cues for home) Seated barrel hug into 110 deg flexion green band 2 x 10 (cues for home) Standing tband green rows 2 x 15 Standing tband green Gh ext 2 x 15   Verbal cues for review of existing HEP.  Advice given on routine use of tendon loading elbow intervention per research recommendation (2x/day).     Manual:  Percussive device to Rt infraspinatus, Rt upper trap, Rt flexor group of forearm near attachment.  Compression c movement to Rt flexor group of forearm near attachment  (cues for home use)  10/11/2021:  Therex: Sidelying Rt shoulder ER x 15  Seated Rt shoulder cross body stretch 15 sec x 5 Seated Rt upper trap 15 sec x 3 stretch   Standing tband green rows 2 x 15 Standing tband green Gh ext 2 x 15  Standing wall push up c SA hold x 15  UBE fwd/back 1.5 mins each way lvl 1.5    Manual:  Compression to Rt infraspinatus  Dry needling: Rt infraspinatus twitch response noted c concordant Rt shoulder/upper arm.   Heat pad 5 mins Rt shoulder c stretching    09/27/2021:  Therex: Review of existing HEP c cues for techniques Prone scapular retraction 5 sec hold x 10 bilateral Prone scapular retraction c GH ext 5 sec hold x 10  bilateral Prone on elbows serratus press hold  5 sec x 10   Standing Rt shoulder ER c towel at side 2 x 10    Manual:  Compression to Rt infraspinatus c Active ER.  Percussive device to Rt infraspinatus trigger points       PATIENT EDUCATION: 10/18/2021 Education details: HEP progression  Person educated: Patient Education method: Consulting civil engineer, Media planner, Verbal cues, and Handouts Education comprehension: verbalized understanding, returned demonstration, and verbal cues required     HOME EXERCISE PROGRAM: Access Code: [email or web address] URL: [email or web address] Date: 10/18/2021 Prepared by: Scot Jun  Exercises - Shoulder External Rotation with Anchored Resistance (Mirrored)  - 1-2 x daily - 7 x weekly - 3 sets - 10 reps - Standing Shoulder Posterior Capsule Stretch (Mirrored)  - 2-3 x daily - 7 x weekly - 1 sets - 5 reps - 15-30 hold - Standing Wrist Extension Stretch  - 2-3 x daily - 7 x weekly - 1 sets - 5 reps - 15-30 hold - Seated Eccentric Wrist Flexion with Dumbbell  - 1-2 x daily - 7 x weekly - 3 sets - 10 reps - Prone Scapular Protraction Retraction AROM on Forearms  - 1-2 x daily - 7 x weekly - 1 sets - 10 reps - 5 hold - Prone Scapular Retraction  - 1-2 x daily - 7 x weekly - 1 sets - 10 reps - 5 hold - Prone Scapular Slide with Shoulder Extension  - 1-2 x daily - 7 x weekly - 1 sets - 10 reps - 5 hold - Standing Shoulder Row with Anchored Resistance  - 1-2 x daily - 7 x weekly - 3 sets - 10 reps - Single Arm Shoulder Extension with Anchored Resistance  - 1-2 x daily - 7 x weekly - 3 sets - 10 reps - Shoulder External Rotation and Scapular Retraction with Resistance  - 1-2 x daily - 7 x weekly - 1-2 sets - 10 reps - Seated 55 Gallon Barrel Hug with Resistance  - 1-2 x daily - 7 x weekly - 1-2 sets - 10 reps   ASSESSMENT:   CLINICAL IMPRESSION: Pt deferred additional trigger point dry needling option today.  In general, improvement noted in symptoms and performance on MMT as assessed today.   Continued improvement in scapular control to aid Rt shoulder movement.  Recommend continued program in HEP to address as well as skilled PT services.      OBJECTIVE IMPAIRMENTS decreased activity tolerance, decreased coordination, decreased endurance, decreased strength, increased fascial restrictions, impaired perceived functional ability, increased muscle spasms, impaired flexibility, impaired UE functional use, improper body mechanics, postural dysfunction, and pain.    ACTIVITY LIMITATIONS carrying, lifting, sleeping, bathing, toileting, dressing, self feeding, reach over head, and hygiene/grooming   PARTICIPATION LIMITATIONS: meal prep, cleaning, laundry, interpersonal relationship, driving, shopping, and community activity   PERSONAL FACTORS  No specific personal factors  are affecting patient's functional outcome.    REHAB POTENTIAL: Good   CLINICAL DECISION MAKING: Stable/uncomplicated   EVALUATION COMPLEXITY: Low     GOALS: Goals reviewed with patient? Yes   Short term PT Goals (target date for Short term goals are 3 weeks 10/11/2021) Patient will demonstrate independent use of home exercise program to maintain progress from in clinic treatments. Goal status: MET 10/11/2021   Long term PT goals (target dates for all long term goals are 10 weeks  11/29/2021 )   1. Patient will demonstrate/report pain at worst less than or equal to 2/10 to facilitate  minimal limitation in daily activity secondary to pain symptoms. Goal status: on going - assessed 10/11/2021   2. Patient will demonstrate independent use of home exercise program to facilitate ability to maintain/progress functional gains from skilled physical therapy services. Goal status: on going - assessed 10/11/2021   3. Patient will demonstrate FOTO outcome > or = 69 % to indicate reduced disability due to condition. Goal status: on going - assessed 10/11/2021   4.  Patient will demonstrate Rt UE MMT 5/5 throughout to facilitate  usual lifting, carrying in functional activity to PLOF s limitation. Goal status: on going - assessed 10/11/2021   5.  Patient will demonstrate/report ability to perform usual household cooking, cleaning, and self care at [facility] s limitation.    Goal status: on going - assessed 10/11/2021   6.  Patient will demonstrate/report ability to sleep s restriction.   Goal status: on going - assessed 10/11/2021       PLAN: PT FREQUENCY: 1-2x/week   PT DURATION: 10 weeks   PLANNED INTERVENTIONS: Therapeutic exercises, Therapeutic activity, Neuro Muscular re-education, Balance training, Gait training, Patient/Family education, Joint mobilization, Stair training, DME instructions, Dry Needling, Electrical stimulation, Cryotherapy, Moist heat, Taping, Ultrasound, Ionotophoresis 74m/ml Dexamethasone, and Manual therapy.  All included unless contraindicated   PLAN FOR NEXT SESSION: Progressive scapular and rotator cuff strengthening   MScot Jun PT, DPT, OCS, ATC 10/18/21  11:53 AM

## 2021-10-25 ENCOUNTER — Ambulatory Visit: Payer: BC Managed Care – PPO | Admitting: Rehabilitative and Restorative Service Providers"

## 2021-10-25 ENCOUNTER — Encounter: Payer: Self-pay | Admitting: Rehabilitative and Restorative Service Providers"

## 2021-10-25 DIAGNOSIS — G8929 Other chronic pain: Secondary | ICD-10-CM | POA: Diagnosis not present

## 2021-10-25 DIAGNOSIS — M6281 Muscle weakness (generalized): Secondary | ICD-10-CM

## 2021-10-25 DIAGNOSIS — M25511 Pain in right shoulder: Secondary | ICD-10-CM

## 2021-10-25 DIAGNOSIS — M25521 Pain in right elbow: Secondary | ICD-10-CM | POA: Diagnosis not present

## 2021-10-25 NOTE — Therapy (Signed)
OUTPATIENT PHYSICAL THERAPY TREATMENT NOTE   Patient Name: Destiny Gardner MRN: [id number] DOB:11-Nov-1965, 56 y.o., female Today's Date: 10/25/2021  PCP: Chevis Pretty   REFERRING PROVIDER: Gregor Hams, MD  END OF SESSION:   PT End of Session - 10/25/21 1110     Visit Number 5    Number of Visits 20    Date for PT Re-Evaluation 11/29/21    PT Start Time 1101    PT Stop Time 1140    PT Time Calculation (min) 39 min    Activity Tolerance Patient tolerated treatment well    Behavior During Therapy [facility] for tasks assessed/performed                Past Medical History:  Diagnosis Date   Endometriosis    Inflammatory bowel disease    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,now with constipation 2010   Past Surgical History:  Procedure Laterality Date   COLONOSCOPY  3/11   TCS/EGD :noraml stomach,colon,and duodenum   ESOPHAGOGASTRODUODENOSCOPY  3/11   with TCS   LAPAROTOMY  1999   endometriosis   MYRINGOTOMY     Tubes,Tympanoplasty   RHINOPLASTY     Tonillectomy     Patient Active Problem List   Diagnosis Date Noted   Cough 05/18/2015   ABDOMINAL PAIN 05/26/2009   CHANGE IN BOWELS 01/27/2009   CONSTIPATION, CHRONIC 12/11/2008   ENDOMETRIOSIS 12/11/2008   IRRITABLE BOWEL SYNDROME, HX OF 12/11/2008    REFERRING DIAG: M25.511,G89.29 (ICD-10-CM) - Chronic right shoulder pain M25.521,G89.29 (ICD-10-CM) - Chronic elbow pain, right  ONSET DATE: 03/08/2019 (2 years +)  THERAPY DIAG:  Chronic right shoulder pain  Pain in right elbow  Muscle weakness (generalized)  Rationale for Evaluation and Treatment Rehabilitation  PERTINENT HISTORY: History of Rt rotator cuff tear with PT per Chart review.    PRECAUTIONS: None  SUBJECTIVE:  Pt indicated an achy/throbby 3/10 on Rt shoulder upon arrival.  Stated feeling some in front of shoulder with exercises at home.  Nothing specific reported functionally at home.   PAIN:  NPRS scale: 3/10 Pain location: Rt  shoulder (anterior shoulder) Alden Hipp - arm Pain description: Rt shoulder  - dull ache mostly Aggravating factors: unsure Relieving factors: reducing activity, icing , OTC medicine   OBJECTIVE:    DIAGNOSTIC FINDINGS:  09/20/2021 : from MD visit note: MRI does reveal supraspinatus tendon tear   PATIENT SURVEYS:  10/18/2021:  FOTO update:   65   09/20/2021 FOTO intake: 60   predicted:  69   COGNITION: 09/20/2021 Overall cognitive status: Within functional limits for tasks assessed                                     SENSATION: 09/20/2021 Light touch for BUE equal and unremarkable for dermatomes   POSTURE: 09/20/2021 Rounded shoulders   UPPER EXTREMITY MMT:    MMT Right 09/20/2021 Left 09/20/2021 Right 09/27/2021 Right 10/11/2021 Right 10/18/2021  Shoulder flexion 4/5 5/5  4+/5 4+/5  Shoulder extension         Shoulder abduction 5/5 5/5   5/5  Shoulder adduction         Shoulder internal rotation 5/5 5/5     Shoulder external rotation 4/5 5/5 4/5 c mild pain upon arrival (5/5 post manual) 4/5 5/5  Elbow flexion 5/5 5/5     Elbow extension 5/5 5/5     Wrist flexion 5/5 5/5  Wrist extension 5/5 5/5     Wrist ulnar deviation         Wrist radial deviation         Wrist pronation 4/5 5/5     Wrist supination 5/5 5/5     (Blank rows = not tested)   UPPER EXTREMITY ROM:              09/20/2021:  no specific joint limitations noted or pain c active movement bilaterally for shoulder flexion, abduction, er, ir.  Elbow flexion/extension supination or pronation.     Right 09/20/2021 Left 09/20/2021  Shoulder flexion      Shoulder extension      Shoulder abduction      Shoulder adduction      Shoulder internal rotation      Shoulder external rotation      Middle trapezius      Lower trapezius      Elbow flexion      Elbow extension      Wrist flexion      Wrist extension      Wrist ulnar deviation      Wrist radial deviation      Wrist pronation      Wrist supination       Grip strength (lbs)      (Blank rows = not tested)   SHOULDER SPECIAL TESTS: 10/11/2021 : (-) painful arc, drop on Rt shoulder   09/20/2021 : (-) painful arc, lift off, drop arm on Rt shoulder      JOINT MOBILITY TESTING:  09/20/2021 no specific limitations noted in RUE.    PALPATION:  10/25/2021:  Anterior deltoid trigger point noted Rt shoulder  09/20/2021 mild tenderness c trigger points noted in Rt infraspinatus, teres minor, Rt anterior deltoid, Rt wrist flexor group throughout               TODAY'S TREATMENT:  10/25/2021:  Therex: UBE fwd/back lvl 3.0 3 mins fwd/back each way  Standing eccentric ER Rt shoulder c towel at side x 15  Standing wall push up c SA press 5 sec hold x 10 Standing Rt shoulder ER c flexion punch green band x 10  Standing shoulder abduction c anterior green band resistance x 10 (stopped due to pain in anterior shoulder).     Manual:  Percussive device to Rt anterior deltoid.  Palpation during DN.  Dry Needling Twitch response noted from Rt anterior deltoid, strong concordant symptoms.  Immediate response of general numb/"weird" feeling in Rt jaw/neck region but resolved after a couple mins rest.  Most likely a parasympathetic nervous system response.    10/18/2021:  Therex: Seated bilateral scapular retraction c bilateral shoulder ER(robbery ex) 2 x 10 green band (cues for home) Seated barrel hug into 110 deg flexion green band 2 x 10 (cues for home) Standing tband green rows 2 x 15 Standing tband green Gh ext 2 x 15   Verbal cues for review of existing HEP.  Advice given on routine use of tendon loading elbow intervention per research recommendation (2x/day).     Manual:  Percussive device to Rt infraspinatus, Rt upper trap, Rt flexor group of forearm near attachment.  Compression c movement to Rt flexor group of forearm near attachment  (cues for home use)  10/11/2021:  Therex: Sidelying Rt shoulder ER x 15  Seated Rt shoulder cross body  stretch 15 sec x 5 Seated Rt upper trap 15 sec x 3 stretch   Standing tband  green rows 2 x 15 Standing tband green Gh ext 2 x 15  Standing wall push up c SA hold x 15  UBE fwd/back 1.5 mins each way lvl 1.5    Manual:  Compression to Rt infraspinatus  Dry needling: Rt infraspinatus twitch response noted c concordant Rt shoulder/upper arm.   Heat pad 5 mins Rt shoulder c stretching   PATIENT EDUCATION: 10/18/2021 Education details: HEP progression  Person educated: Patient Education method: Consulting civil engineer, Media planner, Verbal cues, and Handouts Education comprehension: verbalized understanding, returned demonstration, and verbal cues required     HOME EXERCISE PROGRAM: Access Code: [email or web address] URL: [email or web address] Date: 10/18/2021 Prepared by: Scot Jun  Exercises - Shoulder External Rotation with Anchored Resistance (Mirrored)  - 1-2 x daily - 7 x weekly - 3 sets - 10 reps - Standing Shoulder Posterior Capsule Stretch (Mirrored)  - 2-3 x daily - 7 x weekly - 1 sets - 5 reps - 15-30 hold - Standing Wrist Extension Stretch  - 2-3 x daily - 7 x weekly - 1 sets - 5 reps - 15-30 hold - Seated Eccentric Wrist Flexion with Dumbbell  - 1-2 x daily - 7 x weekly - 3 sets - 10 reps - Prone Scapular Protraction Retraction AROM on Forearms  - 1-2 x daily - 7 x weekly - 1 sets - 10 reps - 5 hold - Prone Scapular Retraction  - 1-2 x daily - 7 x weekly - 1 sets - 10 reps - 5 hold - Prone Scapular Slide with Shoulder Extension  - 1-2 x daily - 7 x weekly - 1 sets - 10 reps - 5 hold - Standing Shoulder Row with Anchored Resistance  - 1-2 x daily - 7 x weekly - 3 sets - 10 reps - Single Arm Shoulder Extension with Anchored Resistance  - 1-2 x daily - 7 x weekly - 3 sets - 10 reps - Shoulder External Rotation and Scapular Retraction with Resistance  - 1-2 x daily - 7 x weekly - 1-2 sets - 10 reps - Seated 55 Gallon Barrel Hug with Resistance  - 1-2 x daily - 7 x weekly - 1-2  sets - 10 reps   ASSESSMENT:   CLINICAL IMPRESSION: Continued focus on improvement in rotator cuff strength/control and scapular stabilization interventions.    Rt anterior deltoid trigger point noted and concordant symptoms present.  Strong response in manual/dry needling.  Reassess response next visit.      OBJECTIVE IMPAIRMENTS decreased activity tolerance, decreased coordination, decreased endurance, decreased strength, increased fascial restrictions, impaired perceived functional ability, increased muscle spasms, impaired flexibility, impaired UE functional use, improper body mechanics, postural dysfunction, and pain.    ACTIVITY LIMITATIONS carrying, lifting, sleeping, bathing, toileting, dressing, self feeding, reach over head, and hygiene/grooming   PARTICIPATION LIMITATIONS: meal prep, cleaning, laundry, interpersonal relationship, driving, shopping, and community activity   PERSONAL FACTORS  No specific personal factors  are affecting patient's functional outcome.    REHAB POTENTIAL: Good   CLINICAL DECISION MAKING: Stable/uncomplicated   EVALUATION COMPLEXITY: Low     GOALS: Goals reviewed with patient? Yes   Short term PT Goals (target date for Short term goals are 3 weeks 10/11/2021) Patient will demonstrate independent use of home exercise program to maintain progress from in clinic treatments. Goal status: MET 10/11/2021   Long term PT goals (target dates for all long term goals are 10 weeks  11/29/2021 )   1. Patient will demonstrate/report pain at worst less than or equal to  2/10 to facilitate minimal limitation in daily activity secondary to pain symptoms. Goal status: on going - assessed 10/11/2021   2. Patient will demonstrate independent use of home exercise program to facilitate ability to maintain/progress functional gains from skilled physical therapy services. Goal status: on going - assessed 10/11/2021   3. Patient will demonstrate FOTO outcome > or = 69 % to  indicate reduced disability due to condition. Goal status: on going - assessed 10/11/2021   4.  Patient will demonstrate Rt UE MMT 5/5 throughout to facilitate usual lifting, carrying in functional activity to PLOF s limitation. Goal status: on going - assessed 10/11/2021   5.  Patient will demonstrate/report ability to perform usual household cooking, cleaning, and self care at [facility] s limitation.    Goal status: on going - assessed 10/11/2021   6.  Patient will demonstrate/report ability to sleep s restriction.   Goal status: on going - assessed 10/11/2021       PLAN: PT FREQUENCY: 1-2x/week   PT DURATION: 10 weeks   PLANNED INTERVENTIONS: Therapeutic exercises, Therapeutic activity, Neuro Muscular re-education, Balance training, Gait training, Patient/Family education, Joint mobilization, Stair training, DME instructions, Dry Needling, Electrical stimulation, Cryotherapy, Moist heat, Taping, Ultrasound, Ionotophoresis 56m/ml Dexamethasone, and Manual therapy.  All included unless contraindicated   PLAN FOR NEXT SESSION: Check anterior shoulder pain response.    MScot Jun PT, DPT, OCS, ATC 10/25/21  11:45 AM

## 2021-11-01 ENCOUNTER — Ambulatory Visit: Payer: BC Managed Care – PPO | Admitting: Rehabilitative and Restorative Service Providers"

## 2021-11-01 ENCOUNTER — Encounter: Payer: Self-pay | Admitting: Rehabilitative and Restorative Service Providers"

## 2021-11-01 DIAGNOSIS — M25521 Pain in right elbow: Secondary | ICD-10-CM

## 2021-11-01 DIAGNOSIS — M25511 Pain in right shoulder: Secondary | ICD-10-CM

## 2021-11-01 DIAGNOSIS — G8929 Other chronic pain: Secondary | ICD-10-CM

## 2021-11-01 DIAGNOSIS — M6281 Muscle weakness (generalized): Secondary | ICD-10-CM

## 2021-11-01 NOTE — Therapy (Addendum)
OUTPATIENT PHYSICAL THERAPY TREATMENT NOTE /DISCHARGE   Patient Name: Destiny Gardner MRN: [id number] DOB:Apr 11, 1965, 56 y.o., female Today's Date: 11/01/2021  PCP: Chevis Pretty   REFERRING PROVIDER: Gregor Hams, MD  END OF SESSION:   PT End of Session - 11/01/21 1107     Visit Number 6    Number of Visits 20    Date for PT Re-Evaluation 11/29/21    PT Start Time 1104    PT Stop Time 1143    PT Time Calculation (min) 39 min    Activity Tolerance Patient tolerated treatment well    Behavior During Therapy [facility] for tasks assessed/performed                 Past Medical History:  Diagnosis Date   Endometriosis    Inflammatory bowel disease    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,now with constipation 2010   Past Surgical History:  Procedure Laterality Date   COLONOSCOPY  3/11   TCS/EGD :noraml stomach,colon,and duodenum   ESOPHAGOGASTRODUODENOSCOPY  3/11   with TCS   LAPAROTOMY  1999   endometriosis   MYRINGOTOMY     Tubes,Tympanoplasty   RHINOPLASTY     Tonillectomy     Patient Active Problem List   Diagnosis Date Noted   Cough 05/18/2015   ABDOMINAL PAIN 05/26/2009   CHANGE IN BOWELS 01/27/2009   CONSTIPATION, CHRONIC 12/11/2008   ENDOMETRIOSIS 12/11/2008   IRRITABLE BOWEL SYNDROME, HX OF 12/11/2008    REFERRING DIAG: M25.511,G89.29 (ICD-10-CM) - Chronic right shoulder pain M25.521,G89.29 (ICD-10-CM) - Chronic elbow pain, right  ONSET DATE: 03/08/2019 (2 years +)  THERAPY DIAG:  Chronic right shoulder pain  Pain in right elbow  Muscle weakness (generalized)  Rationale for Evaluation and Treatment Rehabilitation  PERTINENT HISTORY: History of Rt rotator cuff tear with PT per Chart review.    PRECAUTIONS: None  SUBJECTIVE:  Pt indicated anterior shoulder pain at 3/10 today.  Pt stated pain similar overall since last visit.   Pt indicated usually not higher than 3.  Pt indicated she is able to lift to shelves, do household activity better  with Rt shoulder.  Elbow complaints still noted.   PAIN:  NPRS scale: 3/10 Pain location: Rt shoulder (anterior shoulder) Alden Hipp - arm Pain description: Rt shoulder  - dull ache mostly Aggravating factors: chopping food with Rt arm Relieving factors: reducing activity, icing , OTC medicine   OBJECTIVE:    DIAGNOSTIC FINDINGS:  09/20/2021 : from MD visit note: MRI does reveal supraspinatus tendon tear   PATIENT SURVEYS:  10/18/2021:  FOTO update:   65   09/20/2021 FOTO intake: 60   predicted:  69   COGNITION: 09/20/2021 Overall cognitive status: Within functional limits for tasks assessed                                     SENSATION: 09/20/2021 Light touch for BUE equal and unremarkable for dermatomes   POSTURE: 09/20/2021 Rounded shoulders   UPPER EXTREMITY MMT:    MMT Right 09/20/2021 Left 09/20/2021 Right 09/27/2021 Right 10/11/2021 Right 10/18/2021 Right 11/01/2021  Shoulder flexion 4/5 5/5  4+/5 4+/5   Shoulder extension          Shoulder abduction 5/5 5/5   5/5   Shoulder adduction          Shoulder internal rotation 5/5 5/5      Shoulder external rotation 4/5 5/5  4/5 c mild pain upon arrival (5/5 post manual) 4/5 5/5   Elbow flexion 5/5 5/5      Elbow extension 5/5 5/5      Wrist flexion 5/5 5/5      Wrist extension 5/5 5/5      Wrist ulnar deviation          Wrist radial deviation          Wrist pronation 4/5 5/5      Wrist supination 5/5 5/5      (Blank rows = not tested)   UPPER EXTREMITY ROM:   09/20/2021:  no specific joint limitations noted or pain c active movement bilaterally for shoulder flexion, abduction, er, ir.  Elbow flexion/extension supination or pronation.     Right 09/20/2021 Left 09/20/2021  Shoulder flexion      Shoulder extension      Shoulder abduction      Shoulder adduction      Shoulder internal rotation      Shoulder external rotation      Middle trapezius      Lower trapezius      Elbow flexion      Elbow extension      Wrist  flexion      Wrist extension      Wrist ulnar deviation      Wrist radial deviation      Wrist pronation      Wrist supination      Grip strength (lbs)      (Blank rows = not tested)   SHOULDER SPECIAL TESTS: 10/11/2021 : (-) painful arc, drop on Rt shoulder   09/20/2021 : (-) painful arc, lift off, drop arm on Rt shoulder      JOINT MOBILITY TESTING:  09/20/2021 no specific limitations noted in RUE.    PALPATION:  10/25/2021:  Anterior deltoid trigger point noted Rt shoulder  09/20/2021 mild tenderness c trigger points noted in Rt infraspinatus, teres minor, Rt anterior deltoid, Rt wrist flexor group throughout               TODAY'S TREATMENT:  11/01/2021:  Therex: Sleeper stretch 30 sec x 2 (cues for home use) Blue band biceps eccentric only Rt arm 2 x 10 Blue band triceps 2 x 10 Rt arm Rt elbow wrist flexor eccentric loading 3 lb 3 x 10 Seated Supination/pronotation 3 lbs x 15  Review of HEP routine/consistency.    Manual:  Rt shoulder posterior capsule stretching into IR c contract/relax to ER for mobility gains.    Percussive device to Rt infraspinatus, teres minor   10/25/2021:  Therex: UBE fwd/back lvl 3.0 3 mins fwd/back each way  Standing eccentric ER Rt shoulder c towel at side x 15  Standing wall push up c SA press 5 sec hold x 10 Standing Rt shoulder ER c flexion punch green band x 10  Standing shoulder abduction c anterior green band resistance x 10 (stopped due to pain in anterior shoulder).     Manual:  Percussive device to Rt anterior deltoid.  Palpation during DN.  Dry Needling Twitch response noted from Rt anterior deltoid, strong concordant symptoms.  Immediate response of general numb/"weird" feeling in Rt jaw/neck region but resolved after a couple mins rest.  Most likely a parasympathetic nervous system response.    10/18/2021:  Therex: Seated bilateral scapular retraction c bilateral shoulder ER(robbery ex) 2 x 10 green band (cues for  home) Seated barrel hug into 110 deg flexion green  band 2 x 10 (cues for home) Standing tband green rows 2 x 15 Standing tband green Gh ext 2 x 15   Verbal cues for review of existing HEP.  Advice given on routine use of tendon loading elbow intervention per research recommendation (2x/day).     Manual:  Percussive device to Rt infraspinatus, Rt upper trap, Rt flexor group of forearm near attachment.  Compression c movement to Rt flexor group of forearm near attachment  (cues for home use)   PATIENT EDUCATION: 10/18/2021 Education details: HEP progression  Person educated: Patient Education method: Consulting civil engineer, Media planner, Verbal cues, and Handouts Education comprehension: verbalized understanding, returned demonstration, and verbal cues required     HOME EXERCISE PROGRAM: Access Code: [email or web address] URL: [email or web address] Date: 11/01/2021 Prepared by: Scot Jun  Exercises - Shoulder External Rotation with Anchored Resistance (Mirrored)  - 1-2 x daily - 7 x weekly - 3 sets - 10 reps - Standing Shoulder Posterior Capsule Stretch (Mirrored)  - 2-3 x daily - 7 x weekly - 1 sets - 5 reps - 15-30 hold - Standing Wrist Extension Stretch  - 2-3 x daily - 7 x weekly - 1 sets - 5 reps - 15-30 hold - Seated Eccentric Wrist Flexion with Dumbbell  - 1-2 x daily - 7 x weekly - 3 sets - 10 reps - Prone Scapular Protraction Retraction AROM on Forearms  - 1-2 x daily - 7 x weekly - 1 sets - 10 reps - 5 hold - Prone Scapular Retraction  - 1-2 x daily - 7 x weekly - 1 sets - 10 reps - 5 hold - Prone Scapular Slide with Shoulder Extension  - 1-2 x daily - 7 x weekly - 1 sets - 10 reps - 5 hold - Standing Shoulder Row with Anchored Resistance  - 1-2 x daily - 7 x weekly - 3 sets - 10 reps - Single Arm Shoulder Extension with Anchored Resistance  - 1-2 x daily - 7 x weekly - 3 sets - 10 reps - Seated 55 Gallon Barrel Hug with Resistance  - 1-2 x daily - 7 x weekly - 1-2 sets - 10  reps - Radio broadcast assistant (Mirrored)  - 2-3 x daily - 7 x weekly - 1 sets - 5 reps - 30 hold - Standing Single Arm Elbow Flexion with Resistance (Mirrored)  - 1-2 x daily - 7 x weekly - 3 sets - 10 reps - Standing Elbow Extension with Anchored Resistance at Wall  - 1-2 x daily - 7 x weekly - 3 sets - 10 reps   ASSESSMENT:   CLINICAL IMPRESSION: Rt shoulder complaints primarily voiced related to chopping.  Anterior shoulder pain noted c tenderness to touch across anterior deltoid and proximal long head of biceps.  Additional eccentric loading to biceps for improve tendon symptoms in area if part of presentation.  Rt medial elbow symptoms noted in grasping and full extension so continued work on improving muscle/tendon interaction and irritation at attachment warranted.   Time spent in review of eccentric loading and other HEP frequency advised.      OBJECTIVE IMPAIRMENTS decreased activity tolerance, decreased coordination, decreased endurance, decreased strength, increased fascial restrictions, impaired perceived functional ability, increased muscle spasms, impaired flexibility, impaired UE functional use, improper body mechanics, postural dysfunction, and pain.    ACTIVITY LIMITATIONS carrying, lifting, sleeping, bathing, toileting, dressing, self feeding, reach over head, and hygiene/grooming   PARTICIPATION LIMITATIONS: meal prep, cleaning, laundry, interpersonal relationship, driving, shopping, and community activity  PERSONAL FACTORS  No specific personal factors  are affecting patient's functional outcome.    REHAB POTENTIAL: Good   CLINICAL DECISION MAKING: Stable/uncomplicated   EVALUATION COMPLEXITY: Low     GOALS: Goals reviewed with patient? Yes   Short term PT Goals (target date for Short term goals are 3 weeks 10/11/2021) Patient will demonstrate independent use of home exercise program to maintain progress from in clinic treatments. Goal status: MET 10/11/2021   Long term PT  goals (target dates for all long term goals are 10 weeks  11/29/2021 )   1. Patient will demonstrate/report pain at worst less than or equal to 2/10 to facilitate minimal limitation in daily activity secondary to pain symptoms. Goal status: on going - assessed 10/11/2021   2. Patient will demonstrate independent use of home exercise program to facilitate ability to maintain/progress functional gains from skilled physical therapy services. Goal status: on going - assessed 10/11/2021   3. Patient will demonstrate FOTO outcome > or = 69 % to indicate reduced disability due to condition. Goal status: on going - assessed 10/11/2021   4.  Patient will demonstrate Rt UE MMT 5/5 throughout to facilitate usual lifting, carrying in functional activity to PLOF s limitation. Goal status: on going - assessed 10/11/2021   5.  Patient will demonstrate/report ability to perform usual household cooking, cleaning, and self care at [facility] s limitation.    Goal status: on going - assessed 10/11/2021   6.  Patient will demonstrate/report ability to sleep s restriction.   Goal status: on going - assessed 10/11/2021       PLAN: PT FREQUENCY: 1-2x/week   PT DURATION: 10 weeks   PLANNED INTERVENTIONS: Therapeutic exercises, Therapeutic activity, Neuro Muscular re-education, Balance training, Gait training, Patient/Family education, Joint mobilization, Stair training, DME instructions, Dry Needling, Electrical stimulation, Cryotherapy, Moist heat, Taping, Ultrasound, Ionotophoresis 48m/ml Dexamethasone, and Manual therapy.  All included unless contraindicated   PLAN FOR NEXT SESSION:   LTG check, recheck routine with eccentric loading activity.    MScot Jun PT, DPT, OCS, ATC 11/01/21  11:54 AM   PHYSICAL THERAPY DISCHARGE SUMMARY  Visits from Start of Care: 6  Current functional level related to goals / functional outcomes: See note   Remaining deficits: See note   Education / Equipment: HEP  Patient  goals were partially met. Patient is being discharged due to not returning since the last visit.  MScot Jun PT, DPT, OCS, ATC 12/14/21  11:33 AM

## 2021-11-15 ENCOUNTER — Encounter: Payer: BC Managed Care – PPO | Admitting: Rehabilitative and Restorative Service Providers"

## 2021-11-22 ENCOUNTER — Ambulatory Visit: Payer: No Typology Code available for payment source | Admitting: Family Medicine

## 2021-11-29 ENCOUNTER — Encounter: Payer: No Typology Code available for payment source | Admitting: Rehabilitative and Restorative Service Providers"

## 2021-11-29 NOTE — Progress Notes (Unsigned)
   I, Peterson Lombard, LAT, ATC acting as a scribe for Lynne Leader, MD.  Destiny Gardner is a 56 y.o. female who presents to [place] [facility] today for f/u chronic R shoulder pain, R elbow pain, and R CTS. Pt was last seen by Dr. Georgina Snell on 10/11/21 and was cont PT, completing 6 visits. Today, pt reports   Pertinent review of systems: ***  Relevant historical information: ***   Exam:  There were no vitals taken for this visit. General: Well Developed, well nourished, and in no acute distress.   MSK: ***    Lab and Radiology Results No results found for this or any previous visit (from the past 72 hour(s)). No results found.     Assessment and Plan: 56 y.o. female with ***   PDMP not reviewed this encounter. No orders of the defined types were placed in this encounter.  No orders of the defined types were placed in this encounter.    Discussed warning signs or symptoms. Please see discharge instructions. Patient expresses understanding.   ***

## 2021-11-30 ENCOUNTER — Ambulatory Visit: Payer: BC Managed Care – PPO | Admitting: Family Medicine

## 2021-11-30 VITALS — BP 148/88 | HR 66 | Ht 68.5 in | Wt 147.0 lb

## 2021-11-30 DIAGNOSIS — M25521 Pain in right elbow: Secondary | ICD-10-CM

## 2021-11-30 DIAGNOSIS — G8929 Other chronic pain: Secondary | ICD-10-CM

## 2021-11-30 DIAGNOSIS — G5601 Carpal tunnel syndrome, right upper limb: Secondary | ICD-10-CM | POA: Diagnosis not present

## 2021-11-30 DIAGNOSIS — M25511 Pain in right shoulder: Secondary | ICD-10-CM | POA: Diagnosis not present

## 2021-11-30 NOTE — Patient Instructions (Signed)
Thank you for coming in today.   Let set up for shockwave soon.   Continue home exercises and carpal tunnel night splint.   We have more to do.

## 2021-12-01 ENCOUNTER — Ambulatory Visit (INDEPENDENT_AMBULATORY_CARE_PROVIDER_SITE_OTHER): Payer: Self-pay | Admitting: Family Medicine

## 2021-12-01 DIAGNOSIS — G8929 Other chronic pain: Secondary | ICD-10-CM

## 2021-12-01 DIAGNOSIS — M25511 Pain in right shoulder: Secondary | ICD-10-CM

## 2021-12-01 NOTE — Progress Notes (Signed)
   [facility] [place] Phone: [phone]   Extracorporeal Shockwave Therapy Note    Patient is being treated today with ECSWT. Informed consent was obtained and patient tolerated procedure well.   Therapy performed by Lynne Leader  Condition treated: Rt shoulder supraspinatus chronic calcific tendinitis Treatment preset used: Rotator cuff calcific tendinitis Energy used: 50 mJ Frequency used: 10 Hz Number of pulses: 2000 Treatment #1 of #4  Electronically signed by:  [facility] 11:33 AM 12/01/21

## 2021-12-08 ENCOUNTER — Ambulatory Visit (INDEPENDENT_AMBULATORY_CARE_PROVIDER_SITE_OTHER): Payer: Self-pay | Admitting: Family Medicine

## 2021-12-08 DIAGNOSIS — M25511 Pain in right shoulder: Secondary | ICD-10-CM

## 2021-12-08 DIAGNOSIS — G8929 Other chronic pain: Secondary | ICD-10-CM

## 2021-12-08 NOTE — Progress Notes (Signed)
   [facility] [place] Phone: [phone]   Extracorporeal Shockwave Therapy Note    Patient is being treated today with ECSWT. Informed consent was obtained and patient tolerated procedure well.   Therapy performed by Lynne Leader  Condition treated: Right shoulder supraspinatus chronic calcific tendinitis Treatment preset used: Rotator cuff calcific tendinitis Energy used: 70 mJ Frequency used: 10 Hz Number of pulses: 2000 Treatment #2 of #4  Electronically signed by:  [facility] 1:55 PM 12/08/21

## 2021-12-15 ENCOUNTER — Ambulatory Visit: Payer: BC Managed Care – PPO | Admitting: Family Medicine

## 2021-12-15 ENCOUNTER — Ambulatory Visit: Payer: Self-pay

## 2021-12-15 DIAGNOSIS — M25511 Pain in right shoulder: Secondary | ICD-10-CM

## 2021-12-15 DIAGNOSIS — G8929 Other chronic pain: Secondary | ICD-10-CM

## 2021-12-15 NOTE — Progress Notes (Signed)
    Destiny Gardner is a 56 y.o. female who presents to [place] at [facility] today for shoulder pain.  Patient presents to clinic today for continued right shoulder pain.  This is a chronic ongoing issue that previously was seen at [facility] in the past.  She has had x-rays and other imaging tests for this already.  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   Pertinent review of systems: No fevers or chills  Relevant historical information: Irritable bowel syndrome.   Exam:  General: Well Developed, well nourished, and in no acute distress.   MSK: Right shoulder: Normal-appearing Normal motion pain with abduction.  Positive Hawkins and Neer's test.    Lab and Radiology Results  Diagnostic Limited MSK Ultrasound of: Right shoulder Supraspinatus tendon is still intact with mild chronic calcific change at distal tendon insertion site consistent with chronic calcific tendinopathy. Impression: Chronic calcific tendinopathy      Assessment and Plan: 56 y.o. female with persistent right shoulder pain thought to be due to chronic calcific tendinopathy.  Failing conservative management over the last 3 months.  Plan for MRI of right shoulder to evaluate continued pain and to dictate future treatment options.   PDMP not reviewed this encounter. Orders Placed This Encounter  Procedures   [place] LIMITED JOINT SPACE STRUCTURES UP RIGHT(NO LINKED CHARGES)    Order Specific Question:   Reason for Exam (SYMPTOM  OR DIAGNOSIS REQUIRED)    Answer:   right shoulder pain    Order Specific Question:   Preferred imaging location?    Answer:   [facility]   MR SHOULDER RIGHT WO CONTRAST    Standing Status:   Future    Standing Expiration Date:   12/16/2022    Order  Specific Question:   What is the patient's sedation requirement?    Answer:   No Sedation    Order Specific Question:   Does the patient have a pacemaker or implanted devices?    Answer:   No    Order Specific Question:   Preferred imaging location?    Answer:   Product/process development scientist (table limit-350lbs)   No orders of the defined types were placed in this encounter.    Discussed warning signs or symptoms. Please see discharge instructions. Patient expresses understanding.   The above documentation has been reviewed and is accurate and complete Lynne Leader, M.D.

## 2021-12-15 NOTE — Patient Instructions (Signed)
Thank you for coming in today.   You should hear from MRI scheduling within 1 week. If you do not hear please let me know.    We can make some plans based on the MRI including injection or more shockwave or even surgery.

## 2021-12-20 ENCOUNTER — Ambulatory Visit (INDEPENDENT_AMBULATORY_CARE_PROVIDER_SITE_OTHER): Payer: BC Managed Care – PPO

## 2021-12-20 DIAGNOSIS — M25511 Pain in right shoulder: Secondary | ICD-10-CM

## 2021-12-20 DIAGNOSIS — G8929 Other chronic pain: Secondary | ICD-10-CM

## 2021-12-21 ENCOUNTER — Ambulatory Visit: Payer: BC Managed Care – PPO | Admitting: Family Medicine

## 2021-12-21 VITALS — Ht 68.5 in

## 2021-12-21 DIAGNOSIS — M75121 Complete rotator cuff tear or rupture of right shoulder, not specified as traumatic: Secondary | ICD-10-CM

## 2021-12-21 NOTE — Progress Notes (Signed)
Right shoulder MRI shows a rotator cuff tear.  I could refer you directly to orthopedic surgery to talk about surgical options or you could return to clinic and we can talk about your MRI results in full detail and talk about options.  Please let me know which way you would like to go.

## 2021-12-21 NOTE — Patient Instructions (Addendum)
Thank you for coming in today.   You should hear from ortho soon.   Let me know what you think.   Dr Sammuel Hines.

## 2021-12-21 NOTE — Progress Notes (Unsigned)
   I, Peterson Lombard, LAT, ATC acting as a scribe for Lynne Leader, MD.  Destiny Gardner is a 56 y.o. female who presents to [place] at [facility] today for follow-up chronic right shoulder pain with MRI review.  Patient was last seen by Dr. Georgina Snell on 12/15/2021 and since failing conservative management, was advised to proceed to MRI. Today, pt reports no change in her shoulder pain.   Dx imaging: 12/20/21 R shoulder MRI  Pertinent review of systems: ***  Relevant historical information: ***   Exam:  There were no vitals taken for this visit. General: Well Developed, well nourished, and in no acute distress.   MSK: ***    Lab and Radiology Results No results found for this or any previous visit (from the past 72 hour(s)). MR SHOULDER RIGHT WO CONTRAST  Result Date: 12/21/2021 CLINICAL DATA:  Right shoulder pain for 2-3 years. EXAM: MRI OF THE RIGHT SHOULDER WITHOUT CONTRAST TECHNIQUE: Multiplanar, multisequence MR imaging of the shoulder was performed. No intravenous contrast was administered. COMPARISON:  None Available. FINDINGS: Rotator cuff: Mild tendinosis of the supraspinatus tendon with a large high-grade partial-thickness articular surface tear. Infraspinatus tendon is intact. Teres minor tendon is intact. Subscapularis tendon is intact. Muscles: No muscle atrophy or edema. No intramuscular fluid collection or hematoma. Biceps Long Head: Intraarticular and extraarticular portions of the biceps tendon are intact. Acromioclavicular Joint: Mild arthropathy of the acromioclavicular joint. Small amount of subacromial/subdeltoid bursal fluid. Glenohumeral Joint: No joint effusion. No chondral defect. Labrum: Grossly intact, but evaluation is limited by lack of intraarticular fluid/contrast. Bones: No fracture or dislocation. No aggressive osseous lesion. Other: No fluid collection or hematoma. IMPRESSION: 1. Mild tendinosis of the supraspinatus tendon with a large  high-grade partial-thickness articular surface tear. 2. Mild subacromial/subdeltoid bursitis. Electronically Signed   By: Kathreen Devoid M.D.   On: 12/21/2021 06:25       Assessment and Plan: 56 y.o. female with ***   PDMP not reviewed this encounter. No orders of the defined types were placed in this encounter.  No orders of the defined types were placed in this encounter.    Discussed warning signs or symptoms. Please see discharge instructions. Patient expresses understanding.   ***

## 2021-12-31 ENCOUNTER — Ambulatory Visit (INDEPENDENT_AMBULATORY_CARE_PROVIDER_SITE_OTHER): Payer: BC Managed Care – PPO

## 2021-12-31 ENCOUNTER — Ambulatory Visit (HOSPITAL_BASED_OUTPATIENT_CLINIC_OR_DEPARTMENT_OTHER): Payer: BC Managed Care – PPO | Admitting: Orthopaedic Surgery

## 2021-12-31 DIAGNOSIS — G8929 Other chronic pain: Secondary | ICD-10-CM

## 2021-12-31 DIAGNOSIS — M25511 Pain in right shoulder: Secondary | ICD-10-CM

## 2021-12-31 NOTE — Progress Notes (Signed)
Chief Complaint: Right shoulder pain     History of Present Illness:    Destiny Gardner is a 56 y.o. female right hand dominant female comes in today with multiple years of right shoulder pain.  She does have a known rotator cuff tear which appears to be progressing based on previous imaging from [facility].  She more recently was seeing Dr. Georgina Snell who has referred her to me for further assessment.  She has done physical therapy now for over a month with limited relief.  She did have 1 injection which she says did not give her any significant relief.  She is hesitant to have an additional injection.  She is overall very active.  She enjoys activities like glass blowing which she has a very difficult time doing with the shoulder.  She states that she is at baseline at a 1 out of 10 pain but this can ramp up to a 6 out of 10 pain with certain activities.  This is quite frustrating for her as she cannot reliably predict what issues will set the shoulder off.  She is here today for further discussion.    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,now with constipation 2010   Past Surgical History:  Procedure Laterality Date   COLONOSCOPY  3/11   TCS/EGD :noraml stomach,colon,and duodenum   ESOPHAGOGASTRODUODENOSCOPY  3/11   with TCS   LAPAROTOMY  1999   endometriosis   MYRINGOTOMY     Tubes,Tympanoplasty   RHINOPLASTY     Tonillectomy     Social History   Socioeconomic History   Marital status: Married    Spouse name: Not on file   Number of children: Not on file   Years of education: Not on file   Highest education level: Not on file  Occupational History   Not on file  Tobacco Use   Smoking status: Never   Smokeless tobacco: Never  Substance and Sexual Activity   Alcohol  use: Yes    Alcohol/week: 0.0 standard drinks of alcohol    Comment: social   Drug use: No   Sexual activity: Not on file  Other Topics Concern   Not on file  Social History Narrative   Not on file   Social Determinants of Health   Financial Resource Strain: Not on file  Food Insecurity: Not on file  Transportation Needs: Not on file  Physical Activity: Not on file  Stress: Not on file  Social Connections: Not on file   Family History  Problem Relation Age of Onset   COPD Mother        never smoker   Prostate cancer Father    Allergies  Allergen Reactions   Codeine    Ketorolac Tromethamine    Penicillins    No current outpatient medications on file.   No current facility-administered medications for this visit.   No results found.  Review of Systems:   A ROS was performed including pertinent positives and negatives as documented in the HPI.  Physical Exam :   Constitutional: NAD and appears stated age Neurological: Alert and oriented Psych: Appropriate affect and cooperative There were no  vitals taken for this visit.   Comprehensive Musculoskeletal Exam:    Musculoskeletal Exam    Inspection Right Left  Skin No atrophy or winging No atrophy or winging  Palpation    Tenderness Greater tuberosity, biceps None  Range of Motion    Flexion (passive) 170 170  Flexion (active) 170 170  Abduction 170 170  ER at the side 70 70  Can reach behind back to T12 T12  Strength     Full with pain Full  Special Tests    Pseudoparalytic No No  Neurologic    Fires PIN, radial, median, ulnar, musculocutaneous, axillary, suprascapular, long thoracic, and spinal accessory innervated muscles. No abnormal sensibility  Vascular/Lymphatic    Radial Pulse 2+ 2+  Cervical Exam    Patient has symmetric cervical range of motion with negative Spurling's test.  Special Test: Positive Neer impingement     Imaging:   Xray (3 views right shoulder): Normal  MRI (right  shoulder): there is approximately 80% articular sided tearing of the supraspinatus with healthy-appearing muscle belly.  There is tearing of the biceps tendon as well.  Glenohumeral joint appears to be intact  I personally reviewed and interpreted the radiographs.   Assessment:   56 y.o. female right-hand-dominant female with a right nearly full-thickness articular sided rotator cuff tear of the right shoulder.  Given the fact that she has extensively tried therapy as well as an injection without relief I do believe that she would be a surgical candidate.  I did specifically discussed natural progression of rotator cuff disease and how this likely would progress to a complete tear.  That effect she understands that her repair can prevent progression and ultimately help her with her pain.  We did discuss nonsurgical management including additional injections and therapy although she has already trialed this and would like to defer additional injections.  I did discuss similarly with Dr. Georgina Snell that PRP is less likely to be effective in the setting of a tear although this would be a treatment option.  At this time she will consider surgical management and she will contact us via MyChart should she want to pursue this.  Plan :    -She will contact us via MyChart should she want to pursue arthroscopic rotator cuff repair with biceps tenodesis     I personally saw and evaluated the patient, and participated in the management and treatment plan.  Vanetta Mulders, MD Attending Physician, Orthopedic Surgery  This document was dictated using Dragon voice recognition software. A reasonable attempt at proof reading has been made to minimize errors.

## 2022-01-03 ENCOUNTER — Encounter: Payer: Self-pay | Admitting: Family Medicine

## 2022-01-14 ENCOUNTER — Other Ambulatory Visit (HOSPITAL_BASED_OUTPATIENT_CLINIC_OR_DEPARTMENT_OTHER): Payer: Self-pay

## 2022-01-14 ENCOUNTER — Ambulatory Visit (INDEPENDENT_AMBULATORY_CARE_PROVIDER_SITE_OTHER): Payer: BC Managed Care – PPO | Admitting: Orthopaedic Surgery

## 2022-01-14 DIAGNOSIS — M7581 Other shoulder lesions, right shoulder: Secondary | ICD-10-CM

## 2022-01-14 MED ORDER — IBUPROFEN 800 MG PO TABS
800.0000 mg | ORAL_TABLET | Freq: Three times a day (TID) | ORAL | 0 refills | Status: AC
  Filled 2022-01-14 – 2022-02-15 (×2): qty 30, 10d supply, fill #0

## 2022-01-14 MED ORDER — ASPIRIN 325 MG PO TBEC
325.0000 mg | DELAYED_RELEASE_TABLET | Freq: Every day | ORAL | 0 refills | Status: AC
  Filled 2022-01-14 – 2022-02-15 (×2): qty 30, 30d supply, fill #0

## 2022-01-14 MED ORDER — ACETAMINOPHEN 500 MG PO TABS
500.0000 mg | ORAL_TABLET | Freq: Three times a day (TID) | ORAL | 0 refills | Status: AC
  Filled 2022-01-14: qty 30, 10d supply, fill #0

## 2022-01-14 MED ORDER — OXYCODONE HCL 5 MG PO TABS
5.0000 mg | ORAL_TABLET | ORAL | 0 refills | Status: AC | PRN
  Filled 2022-01-14 – 2022-02-15 (×2): qty 10, 2d supply, fill #0

## 2022-01-14 NOTE — Progress Notes (Signed)
Chief Complaint: Right shoulder pain     History of Present Illness:   01/14/2022: Presents today for follow-up and further discussion of her right shoulder.  At this time she is continuing to remain symptomatic.  She has specific questions regarding the surgery.  Her pain has been more flared up after she was doing yard work 1 day prior.  Destiny Gardner is a 56 y.o. female right hand dominant female comes in today with multiple years of right shoulder pain.  She does have a known rotator cuff tear which appears to be progressing based on previous imaging from [facility].  She more recently was seeing Dr. Denyse Amass who has referred her to me for further assessment.  She has done physical therapy now for over a month with limited relief.  She did have 1 injection which she says did not give her any significant relief.  She is hesitant to have an additional injection.  She is overall very active.  She enjoys activities like glass blowing which she has a very difficult time doing with the shoulder.  She states that she is at baseline at a 1 out of 10 pain but this can ramp up to a 6 out of 10 pain with certain activities.  This is quite frustrating for her as she cannot reliably predict what issues will set the shoulder off.  She is here today for further discussion.    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,now with constipation 2010   Past Surgical History:  Procedure Laterality Date   COLONOSCOPY  3/11   TCS/EGD :noraml stomach,colon,and duodenum   ESOPHAGOGASTRODUODENOSCOPY  3/11   with TCS   LAPAROTOMY  1999   endometriosis   MYRINGOTOMY     Tubes,Tympanoplasty   RHINOPLASTY     Tonillectomy     Social History   Socioeconomic History   Marital status: Married    Spouse name:  Not on file   Number of children: Not on file   Years of education: Not on file   Highest education level: Not on file  Occupational History   Not on file  Tobacco Use   Smoking status: Never   Smokeless tobacco: Never  Substance and Sexual Activity   Alcohol use: Yes    Alcohol/week: 0.0 standard drinks of alcohol    Comment: social   Drug use: No   Sexual activity: Not on file  Other Topics Concern   Not on file  Social History Narrative   Not on file   Social Determinants of Health   Financial Resource Strain: Not on file  Food Insecurity: Not on file  Transportation Needs: Not on file  Physical Activity: Not on file  Stress: Not on file  Social Connections: Not on file   Family History  Problem Relation Age of Onset   COPD Mother        never smoker   Prostate cancer Father    Allergies  Allergen Reactions   Codeine    Ketorolac Tromethamine    Penicillins    Current Outpatient Medications  Medication Sig Dispense Refill   acetaminophen (TYLENOL) 500 MG tablet Take 1  tablet (500 mg total) by mouth every 8 (eight) hours for 10 days. 30 tablet 0   aspirin EC 325 MG tablet Take 1 tablet (325 mg total) by mouth daily. 30 tablet 0   ibuprofen (ADVIL) 800 MG tablet Take 1 tablet (800 mg total) by mouth every 8 (eight) hours for 10 days. Please take with food, please alternate with acetaminophen 30 tablet 0   oxyCODONE (OXY IR/ROXICODONE) 5 MG immediate release tablet Take 1 tablet (5 mg total) by mouth every 4 (four) hours as needed (severe pain). 10 tablet 0   No current facility-administered medications for this visit.   No results found.  Review of Systems:   A ROS was performed including pertinent positives and negatives as documented in the HPI.  Physical Exam :   Constitutional: NAD and appears stated age Neurological: Alert and oriented Psych: Appropriate affect and cooperative There were no vitals taken for this visit.   Comprehensive Musculoskeletal  Exam:    Musculoskeletal Exam    Inspection Right Left  Skin No atrophy or winging No atrophy or winging  Palpation    Tenderness Greater tuberosity, biceps None  Range of Motion    Flexion (passive) 170 170  Flexion (active) 170 170  Abduction 170 170  ER at the side 70 70  Can reach behind back to T12 T12  Strength     Full with pain Full  Special Tests    Pseudoparalytic No No  Neurologic    Fires PIN, radial, median, ulnar, musculocutaneous, axillary, suprascapular, long thoracic, and spinal accessory innervated muscles. No abnormal sensibility  Vascular/Lymphatic    Radial Pulse 2+ 2+  Cervical Exam    Patient has symmetric cervical range of motion with negative Spurling's test.  Special Test: Positive Neer impingement     Imaging:   Xray (3 views right shoulder): Normal  MRI (right shoulder): there is approximately 80% articular sided tearing of the supraspinatus with healthy-appearing muscle belly.  There is tearing of the biceps tendon as well.  Glenohumeral joint appears to be intact  I personally reviewed and interpreted the radiographs.   Assessment:   56 y.o. female right-hand-dominant female with a right nearly full-thickness articular sided rotator cuff tear of the right shoulder.  Given the fact that she has extensively tried therapy as well as an injection without relief I do believe that she would be a surgical candidate.  I did specifically discussed natural progression of rotator cuff disease and how this likely would progress to a complete tear.  That effect she understands that her repair can prevent progression and ultimately help her with her pain.  We did discuss nonsurgical management including additional injections and therapy although she has already trialed this and would like to defer additional injections.  I did discuss similarly with Dr. Denyse Amass that PRP is less likely to be effective in the setting of a tear although this would be a treatment  option.  At this time after further discussion she would like to pursue right shoulder arthroscopy with rotator cuff repair biceps tenodesis Plan :    -Plan for right shoulder arthroscopy with rotator cuff repair and biceps tenodesis   After a lengthy discussion of treatment options, including risks, benefits, alternatives, complications of surgical and nonsurgical conservative options, the patient elected surgical repair.   The patient  is aware of the material risks  and complications including, but not limited to injury to adjacent structures, neurovascular injury, infection, numbness, bleeding, implant failure, thermal  burns, stiffness, persistent pain, failure to heal, disease transmission from allograft, need for further surgery, dislocation, anesthetic risks, blood clots, risks of death,and others. The probabilities of surgical success and failure discussed with patient given their particular co-morbidities.The time and nature of expected rehabilitation and recovery was discussed.The patient's questions were all answered preoperatively.  No barriers to understanding were noted. I explained the natural history of the disease process and Rx rationale.  I explained to the patient what I considered to be reasonable expectations given their personal situation.  The final treatment plan was arrived at through a shared patient decision making process model.     I personally saw and evaluated the patient, and participated in the management and treatment plan.  Huel Cote, MD Attending Physician, Orthopedic Surgery  This document was dictated using Dragon voice recognition software. A reasonable attempt at proof reading has been made to minimize errors.  Attention patient

## 2022-01-19 ENCOUNTER — Other Ambulatory Visit (HOSPITAL_BASED_OUTPATIENT_CLINIC_OR_DEPARTMENT_OTHER): Payer: Self-pay | Admitting: Orthopaedic Surgery

## 2022-01-19 DIAGNOSIS — M7581 Other shoulder lesions, right shoulder: Secondary | ICD-10-CM

## 2022-02-01 ENCOUNTER — Other Ambulatory Visit (HOSPITAL_BASED_OUTPATIENT_CLINIC_OR_DEPARTMENT_OTHER): Payer: Self-pay

## 2022-02-11 ENCOUNTER — Encounter (HOSPITAL_BASED_OUTPATIENT_CLINIC_OR_DEPARTMENT_OTHER): Payer: Self-pay | Admitting: Orthopaedic Surgery

## 2022-02-15 ENCOUNTER — Other Ambulatory Visit (HOSPITAL_BASED_OUTPATIENT_CLINIC_OR_DEPARTMENT_OTHER): Payer: Self-pay

## 2022-02-22 ENCOUNTER — Ambulatory Visit (HOSPITAL_BASED_OUTPATIENT_CLINIC_OR_DEPARTMENT_OTHER): Payer: Self-pay | Admitting: Orthopaedic Surgery

## 2022-02-22 DIAGNOSIS — M7581 Other shoulder lesions, right shoulder: Secondary | ICD-10-CM

## 2022-02-22 NOTE — Pre-Procedure Instructions (Addendum)
Surgical Instructions    Your procedure is scheduled on March 03, 2022.  Report to [facility] Main Entrance "A" at 5:30 A.M., then check in with the Admitting office.  Call this number if you have problems the morning of surgery:  [phone]   If you have any questions prior to your surgery date call [phone]: Open Monday-Friday 8am-4pm    Remember:  Do not eat after midnight the night before your surgery  You may drink clear liquids until 4:30 AM the morning of your surgery.   Clear liquids allowed are: Water, Non-Citrus Juices (without pulp), Carbonated Beverages, Clear Tea, Black Coffee Only (NO MILK, CREAM OR POWDERED CREAMER of any kind), and Gatorade.  Patient Instructions  The night before surgery:  No food after midnight. ONLY clear liquids after midnight  The day of surgery (if you do NOT have diabetes):  Drink ONE (1) Pre-Surgery Clear Ensure by 4:30 AM the morning of surgery. Drink in one sitting. Do not sip.  This drink was given to you during your hospital  pre-op appointment visit.  Nothing else to drink after completing the  Pre-Surgery Clear Ensure.         If you have questions, please contact your surgeon's office.     Take these medicines the morning of surgery with A SIP OF WATER:  NONE   As of today, STOP taking any Aspirin (unless otherwise instructed by your surgeon) Aleve, Naproxen, Ibuprofen, Motrin, Advil, Goody's, BC's, all herbal medications, fish oil, and all vitamins.                     Do NOT Smoke (Tobacco/Vaping) for 24 hours prior to your procedure.  If you use a CPAP at night, you may bring your mask/headgear for your overnight stay.   Contacts, glasses, piercing's, hearing aid's, dentures or partials may not be worn into surgery, please bring cases for these belongings.    For patients admitted to the hospital, discharge time will be determined by your treatment team.   Patients discharged the day of surgery will not be  allowed to drive home, and someone needs to stay with them for 24 hours.  SURGICAL WAITING ROOM VISITATION Patients having surgery or a procedure may have no more than 2 support people in the waiting area - these visitors may rotate.   Children under the age of 82 must have an adult with them who is not the patient. If the patient needs to stay at the hospital during part of their recovery, the visitor guidelines for inpatient rooms apply. Pre-op nurse will coordinate an appropriate time for 1 support person to accompany patient in pre-op.  This support person may not rotate.   Please refer to the [facility] website for the visitor guidelines for Inpatients (after your surgery is over and you are in a regular room).    Special instructions:   [place]- Preparing For Surgery  Before surgery, you can play an important role. Because skin is not sterile, your skin needs to be as free of germs as possible. You can reduce the number of germs on your skin by washing with CHG (chlorahexidine gluconate) Soap before surgery.  CHG is an antiseptic cleaner which kills germs and bonds with the skin to continue killing germs even after washing.    Oral Hygiene is also important to reduce your risk of infection.  Remember - BRUSH YOUR TEETH THE MORNING OF SURGERY WITH YOUR REGULAR TOOTHPASTE  Please do  not use if you have an allergy to CHG or antibacterial soaps. If your skin becomes reddened/irritated stop using the CHG.  Do not shave (including legs and underarms) for at least 48 hours prior to first CHG shower. It is OK to shave your face.  Please follow these instructions carefully.   Shower the NIGHT BEFORE SURGERY and the MORNING OF SURGERY  If you chose to wash your hair, wash your hair first as usual with your normal shampoo.  After you shampoo, rinse your hair and body thoroughly to remove the shampoo.  Use CHG Soap as you would any other liquid soap. You can apply CHG directly to the skin  and wash gently with a scrungie or a clean washcloth.   Apply the CHG Soap to your body ONLY FROM THE NECK DOWN.  Do not use on open wounds or open sores. Avoid contact with your eyes, ears, mouth and genitals (private parts). Wash Face and genitals (private parts)  with your normal soap.   Wash thoroughly, paying special attention to the area where your surgery will be performed.  Thoroughly rinse your body with warm water from the neck down.  DO NOT shower/wash with your normal soap after using and rinsing off the CHG Soap.  Pat yourself dry with a CLEAN TOWEL.  Wear CLEAN PAJAMAS to bed the night before surgery  Place CLEAN SHEETS on your bed the night before your surgery  DO NOT SLEEP WITH PETS.   Day of Surgery: Take a shower with CHG soap. Do not wear jewelry or makeup Do not wear lotions, powders, perfumes/colognes, or deodorant. Do not shave 48 hours prior to surgery.  Men may shave face and neck. Do not bring valuables to the hospital.  [facility] is not responsible for any belongings or valuables. Do not wear nail polish, gel polish, artificial nails, or any other type of covering on natural nails (fingers and toes) If you have artificial nails or gel coating that need to be removed by a nail salon, please have this removed prior to surgery. Artificial nails or gel coating may interfere with anesthesia's ability to adequately monitor your vital signs.  Wear Clean/Comfortable clothing the morning of surgery Remember to brush your teeth WITH YOUR REGULAR TOOTHPASTE.   Please read over the following fact sheets that you were given.    If you received a COVID test during your pre-op visit  it is requested that you wear a mask when out in public, stay away from anyone that may not be feeling well and notify your surgeon if you develop symptoms. If you have been in contact with anyone that has tested positive in the last 10 days please notify you surgeon.

## 2022-02-23 ENCOUNTER — Encounter (HOSPITAL_COMMUNITY): Payer: Self-pay

## 2022-02-23 ENCOUNTER — Other Ambulatory Visit: Payer: Self-pay

## 2022-02-23 ENCOUNTER — Encounter (HOSPITAL_COMMUNITY)
Admission: RE | Admit: 2022-02-23 | Discharge: 2022-02-23 | Disposition: A | Discharge status: Home / Self Care | Payer: BC Managed Care – PPO | Source: Ambulatory Visit | Attending: Orthopaedic Surgery | Admitting: Orthopaedic Surgery

## 2022-02-23 VITALS — BP 130/81 | HR 74 | Temp 98.0°F | Resp 18 | Ht 68.0 in | Wt 146.6 lb

## 2022-02-23 DIAGNOSIS — Z01812 Encounter for preprocedural laboratory examination: Secondary | ICD-10-CM | POA: Insufficient documentation

## 2022-02-23 DIAGNOSIS — M75111 Incomplete rotator cuff tear or rupture of right shoulder, not specified as traumatic: Secondary | ICD-10-CM | POA: Insufficient documentation

## 2022-02-23 DIAGNOSIS — Z01818 Encounter for other preprocedural examination: Secondary | ICD-10-CM

## 2022-02-23 HISTORY — DX: Headache, unspecified: R51.9

## 2022-02-23 HISTORY — DX: Other complications of anesthesia, initial encounter: T88.59XA

## 2022-02-23 HISTORY — DX: Other specified postprocedural states: Z98.890

## 2022-02-23 HISTORY — DX: Other specified postprocedural states: R11.2

## 2022-02-23 HISTORY — DX: Nausea with vomiting, unspecified: Z98.890

## 2022-02-23 LAB — CBC
HCT: 40.2 % (ref 36.0–46.0)
Hemoglobin: 13.1 g/dL (ref 12.0–15.0)
MCH: 30.4 pg (ref 26.0–34.0)
MCHC: 32.6 g/dL (ref 30.0–36.0)
MCV: 93.3 fL (ref 80.0–100.0)
Platelets: 277 10*3/uL (ref 150–400)
RBC: 4.31 MIL/uL (ref 3.87–5.11)
RDW: 11.8 % (ref 11.5–15.5)
WBC: 5.3 10*3/uL (ref 4.0–10.5)
nRBC: 0 % (ref 0.0–0.2)

## 2022-02-23 LAB — GLUCOSE, CAPILLARY: Glucose-Capillary: 138 mg/dL — ABNORMAL HIGH (ref 70–99)

## 2022-02-23 NOTE — Progress Notes (Signed)
Anesthesia PAT Evaluation:  Case: [id number] Date/Time: 03/03/22 0715   Procedure: RIGHT SHOULDER ARTHROSCOPY WITH ROTATOR CUFF REPAIR AND BICEPS TENODESIS (Right)   Anesthesia type: Regional   Pre-op diagnosis: RIGHT ROTATOR CUFF TEAR   Location: [facility] ROOM 07 / [facility] OR   Surgeons: Huel Cote, MD       DISCUSSION: Patient is a 56 year old female scheduled for the above procedure.   History includes never smoker, post-operative N/V, endometriosis, inflammatory bowel disease, ocular migraines, rhinoplasty (1989), tonsillectomy (1990), laparotomy (1999), umbilical hernia repair (2015).   She requested to speak with an anesthesia representative prior to the day of surgery. She has had issues with severe post-operative N/V, waking up/awareness during her rhinoplasty (1989) and later a colonoscopy, and she is concerned about it effecting her memory since memory issues run in her family.   She is a pleasant female who expressed concerns about anesthesia as outlined above. We discussed typically these cases are done with general anesthesia and interscalene block. She is concerned about getting "too much" anesthesia. We discussed usual premedications for nerve blocks and methods her anesthesia team may use to monitor effects of anesthesia intraoperatively. We discussed typical anti-emetic given prophylactically and as needed post-operatively. She wondered if a scopolamine patch might help. We discussed that definitive anesthesia plan and specifics will be discussed with her by her assigned anesthesiologist on the day of surgery. She was appreciate of the time spent and feels comfortable with follow-up communication with her anesthesiologist on the day of surgery.    VS: BP 130/81   Pulse 74   Temp 36.7 C (Oral)   Resp 18   Ht 5\' 8"  (1.727 m)   Wt 66.5 kg   LMP 05/08/2015   SpO2 100%   BMI 22.29 kg/m Provider wore a face mask. Heart RRR, no murmur noted. Lungs clear.    PROVIDERS: 07/08/2015, MD is PCP [facility]) She is not followed routinely by cardiology. She had evaluation at [facility] with [facility], MD for palpitations with occasional PVC on one EKG. Reassurance provided. She wanted an echo as well, so this was done on 11/22/12 which she reported as normal (report is not viewable in Care Everywhere).      LABS: Labs reviewed: Acceptable for surgery. (all labs ordered are listed, but only abnormal results are displayed)  Labs Reviewed  GLUCOSE, CAPILLARY - Abnormal; Notable for the following components:      Result Value   Glucose-Capillary 138 (*)    All other components within normal limits  CBC     IMAGES: Xray Right Shoulder 12/31/21: FINDINGS: Normal alignment. No acute fracture. No significant arthropathy. The soft tissues are unremarkable. IMPRESSION: No acute osseous abnormality or significant degenerative changes.   MRI Right Shoulder 12/20/21: IMPRESSION: 1. Mild tendinosis of the supraspinatus tendon with a large high-grade partial-thickness articular surface tear. 2. Mild subacromial/subdeltoid bursitis.   EKG: N/A   CV: CT Coronary/Calcium Scoring 04/07/20: IMPRESSION: 1. No evidence of calcified coronary artery disease. Coronary calcium score of 0. 2. 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 Could consider the following, annual imaging followup by CTA or MRA. This recommendation follows 2010 ACCF/AHA/AATS/ACR/ASA/SCA/SCAI/SIR/STS/SVM Guidelines for the Diagnosis and Management of Patients with Thoracic Aortic Disease. Circulation.2010; 1212011. Aortic aneurysm NOS (ICD10-I71.9)  She reported a normal echo in 2014.   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,now with constipation 2010   PONV (postoperative nausea and vomiting)     Past Surgical History:   Procedure Laterality Date   COLONOSCOPY  05/05/2009   TCS/EGD :noraml stomach,colon,and duodenum   COLONOSCOPY  2020   ESOPHAGOGASTRODUODENOSCOPY  05/05/2009   with TCS   HERNIA REPAIR  2015   Umbilical Hernia   LAPAROTOMY  03/07/1997   endometriosis   MYRINGOTOMY     Tubes,Tympanoplasty   RHINOPLASTY  1989   Tonillectomy  1990   WISDOM TOOTH EXTRACTION      MEDICATIONS:  Ascorbic Acid (VITAMIN C PO)   aspirin EC 325 MG tablet   ibuprofen (ADVIL) 800 MG tablet   oxyCODONE (OXY IR/ROXICODONE) 5 MG immediate release tablet   No current facility-administered medications for this encounter.    Shonna Chock, PA-C Surgical Short Stay/Anesthesiology [facility] Phone [phone] [facility] Phone [phone] 02/23/2022 4:45 PM

## 2022-02-23 NOTE — Anesthesia Preprocedure Evaluation (Addendum)
Anesthesia Evaluation  Patient identified by MRN, date of birth, ID band Patient awake    Reviewed: Allergy & Precautions, NPO status , Patient's Chart, lab work & pertinent test results  History of Anesthesia Complications (+) PONV and history of anesthetic complications  Airway Mallampati: II  TM Distance: >3 FB Neck ROM: Full    Dental  (+) Dental Advisory Given   Pulmonary neg pulmonary ROS   breath sounds clear to auscultation       Cardiovascular negative cardio ROS  Rhythm:Regular Rate:Normal     Neuro/Psych negative neurological ROS     GI/Hepatic negative GI ROS, Neg liver ROS,,,  Endo/Other  negative endocrine ROS    Renal/GU negative Renal ROS     Musculoskeletal   Abdominal   Peds  Hematology negative hematology ROS (+)   Anesthesia Other Findings   Reproductive/Obstetrics                              Lab Results  Component Value Date   WBC 5.3 02/23/2022   HGB 13.1 02/23/2022   HCT 40.2 02/23/2022   MCV 93.3 02/23/2022   PLT 277 02/23/2022   Lab Results  Component Value Date   CREATININE 0.84 05/27/2009   BUN 11 05/27/2009   NA 140 05/27/2009   K 4.0 05/27/2009   CL 103 05/27/2009   CO2 26 05/27/2009    Anesthesia Physical Anesthesia Plan  ASA: 1  Anesthesia Plan: General   Post-op Pain Management: Regional block*, Tylenol PO (pre-op)* and Gabapentin PO (pre-op)*   Induction: Intravenous  PONV Risk Score and Plan: 4 or greater and Propofol infusion, Dexamethasone, Ondansetron, Midazolam, Treatment may vary due to age or medical condition and Scopolamine patch - Pre-op  Airway Management Planned: Oral ETT  Additional Equipment:   Intra-op Plan:   Post-operative Plan: Extubation in OR  Informed Consent: I have reviewed the patients History and Physical, chart, labs and discussed the procedure including the risks, benefits and alternatives for the  proposed anesthesia with the patient or authorized representative who has indicated his/her understanding and acceptance.     Dental advisory given  Plan Discussed with: CRNA  Anesthesia Plan Comments: (  )        Anesthesia Quick Evaluation

## 2022-02-23 NOTE — Progress Notes (Signed)
PCP - Dr. Georgann Housekeeper Cardiologist - Per pt, she saw one 10+ years ago when she was going through menopause. Thought she was having palpitations, but all work-up was negative  PPM/ICD - Denies Device Orders - n/a Rep Notified - n/a  Chest x-ray - n/a EKG - Per pt, many years ago, result normal Stress Test - Denies ECHO - 11/22/2012 Cardiac Cath - Denies  Sleep Study - Denies CPAP - n/a  No DM  Last dose of GLP1 agonist- n/a GLP1 instructions: n/a  Blood Thinner Instructions: n/a Aspirin Instructions: n/a  ERAS Protcol - Clear liquids 0430 morning of surgery PRE-SURGERY Ensure or G2- Ensure given to pt at PAT appointment.  COVID TEST- n/a   Anesthesia review: No  Patient denies shortness of breath, fever, cough and chest pain at PAT appointment   All instructions explained to the patient, with a verbal understanding of the material. Patient agrees to go over the instructions while at home for a better understanding. Patient also instructed to self quarantine after being tested for COVID-19. The opportunity to ask questions was provided.

## 2022-02-23 NOTE — Progress Notes (Signed)
Per Dr. Steward Drone, pt is to receive betadine as surgical site prep morning of surgery and not CHG. Pt has allergy to CHG. Pt also instructed to use Dial Antibacterial Soap the night before surgery and the morning of surgery to prep. Pt understands all instructions.

## 2022-03-03 ENCOUNTER — Encounter (HOSPITAL_COMMUNITY): Payer: Self-pay | Admitting: Orthopaedic Surgery

## 2022-03-03 ENCOUNTER — Other Ambulatory Visit: Payer: Self-pay

## 2022-03-03 ENCOUNTER — Ambulatory Visit (HOSPITAL_COMMUNITY)
Admission: RE | Admit: 2022-03-03 | Discharge: 2022-03-03 | Disposition: A | Discharge status: Home / Self Care | Payer: BC Managed Care – PPO | Attending: Orthopaedic Surgery | Admitting: Orthopaedic Surgery

## 2022-03-03 ENCOUNTER — Encounter (HOSPITAL_COMMUNITY)
Admission: RE | Disposition: A | Discharge status: Home / Self Care | Payer: Self-pay | Source: Home / Self Care | Attending: Orthopaedic Surgery

## 2022-03-03 ENCOUNTER — Ambulatory Visit (HOSPITAL_COMMUNITY): Payer: BC Managed Care – PPO | Admitting: Anesthesiology

## 2022-03-03 ENCOUNTER — Ambulatory Visit (HOSPITAL_COMMUNITY): Payer: BC Managed Care – PPO | Admitting: Vascular Surgery

## 2022-03-03 DIAGNOSIS — M75121 Complete rotator cuff tear or rupture of right shoulder, not specified as traumatic: Secondary | ICD-10-CM | POA: Diagnosis not present

## 2022-03-03 DIAGNOSIS — M659 Synovitis and tenosynovitis, unspecified: Secondary | ICD-10-CM | POA: Insufficient documentation

## 2022-03-03 DIAGNOSIS — M7521 Bicipital tendinitis, right shoulder: Secondary | ICD-10-CM | POA: Diagnosis not present

## 2022-03-03 DIAGNOSIS — M7581 Other shoulder lesions, right shoulder: Secondary | ICD-10-CM | POA: Diagnosis not present

## 2022-03-03 HISTORY — PX: SHOULDER ARTHROSCOPY WITH ROTATOR CUFF REPAIR AND OPEN BICEPS TENODESIS: SHX6677

## 2022-03-03 SURGERY — SHOULDER ARTHROSCOPY WITH ROTATOR CUFF REPAIR AND OPEN BICEPS TENODESIS
Anesthesia: General | Site: Shoulder | Laterality: Right

## 2022-03-03 MED ORDER — LACTATED RINGERS IV SOLN: INTRAVENOUS | Status: DC

## 2022-03-03 MED ORDER — TRANEXAMIC ACID-NACL 1000-0.7 MG/100ML-% IV SOLN
1000.0000 mg | INTRAVENOUS | Status: AC
  Administered 2022-03-03: 1000 mg via INTRAVENOUS
  Filled 2022-03-03: qty 100

## 2022-03-03 MED ORDER — LIDOCAINE 2% (20 MG/ML) 5 ML SYRINGE
INTRAMUSCULAR | Status: DC | PRN
  Administered 2022-03-03: 60 mg via INTRAVENOUS

## 2022-03-03 MED ORDER — BUPIVACAINE HCL (PF) 0.25 % IJ SOLN
INTRAMUSCULAR | Status: DC | PRN
  Administered 2022-03-03: 15 mL via PERINEURAL

## 2022-03-03 MED ORDER — ACETAMINOPHEN 500 MG PO TABS
1000.0000 mg | ORAL_TABLET | Freq: Once | ORAL | Status: AC
  Administered 2022-03-03: 1000 mg via ORAL
  Filled 2022-03-03: qty 2

## 2022-03-03 MED ORDER — AMISULPRIDE (ANTIEMETIC) 5 MG/2ML IV SOLN: 10.0000 mg | Freq: Once | INTRAVENOUS | Status: DC | PRN

## 2022-03-03 MED ORDER — ORAL CARE MOUTH RINSE
15.0000 mL | Freq: Once | OROMUCOSAL | Status: AC
  Administered 2022-03-03: 15 mL via OROMUCOSAL

## 2022-03-03 MED ORDER — FENTANYL CITRATE (PF) 250 MCG/5ML IJ SOLN
INTRAMUSCULAR | Status: AC
  Filled 2022-03-03: qty 5

## 2022-03-03 MED ORDER — DEXAMETHASONE SODIUM PHOSPHATE 10 MG/ML IJ SOLN
INTRAMUSCULAR | Status: DC | PRN
  Administered 2022-03-03: 5 mg via INTRAVENOUS

## 2022-03-03 MED ORDER — CEFAZOLIN SODIUM-DEXTROSE 2-4 GM/100ML-% IV SOLN
2.0000 g | INTRAVENOUS | Status: AC
  Administered 2022-03-03: 2 g via INTRAVENOUS
  Filled 2022-03-03: qty 100

## 2022-03-03 MED ORDER — PHENYLEPHRINE HCL-NACL 20-0.9 MG/250ML-% IV SOLN
INTRAVENOUS | Status: DC | PRN
  Administered 2022-03-03: 50 ug/min via INTRAVENOUS

## 2022-03-03 MED ORDER — PHENYLEPHRINE 80 MCG/ML (10ML) SYRINGE FOR IV PUSH (FOR BLOOD PRESSURE SUPPORT)
PREFILLED_SYRINGE | INTRAVENOUS | Status: DC | PRN
  Administered 2022-03-03: 160 ug via INTRAVENOUS
  Administered 2022-03-03: 80 ug via INTRAVENOUS
  Administered 2022-03-03 (×2): 160 ug via INTRAVENOUS

## 2022-03-03 MED ORDER — SUGAMMADEX SODIUM 200 MG/2ML IV SOLN
INTRAVENOUS | Status: DC | PRN
  Administered 2022-03-03: 120 mg via INTRAVENOUS

## 2022-03-03 MED ORDER — GABAPENTIN 300 MG PO CAPS
300.0000 mg | ORAL_CAPSULE | Freq: Once | ORAL | Status: AC
  Administered 2022-03-03: 300 mg via ORAL
  Filled 2022-03-03: qty 1

## 2022-03-03 MED ORDER — MIDAZOLAM HCL 2 MG/2ML IJ SOLN
INTRAMUSCULAR | Status: AC
  Filled 2022-03-03: qty 2

## 2022-03-03 MED ORDER — PROPOFOL 10 MG/ML IV BOLUS
INTRAVENOUS | Status: AC
  Filled 2022-03-03: qty 20

## 2022-03-03 MED ORDER — CHLORHEXIDINE GLUCONATE 0.12 % MT SOLN: 15.0000 mL | Freq: Once | OROMUCOSAL | Status: DC

## 2022-03-03 MED ORDER — FENTANYL CITRATE (PF) 100 MCG/2ML IJ SOLN: 25.0000 ug | INTRAMUSCULAR | Status: DC | PRN

## 2022-03-03 MED ORDER — PHENYLEPHRINE HCL (PRESSORS) 10 MG/ML IV SOLN
INTRAVENOUS | Status: AC
  Filled 2022-03-03: qty 1

## 2022-03-03 MED ORDER — 0.9 % SODIUM CHLORIDE (POUR BTL) OPTIME
TOPICAL | Status: DC | PRN
  Administered 2022-03-03: 1000 mL

## 2022-03-03 MED ORDER — PROPOFOL 500 MG/50ML IV EMUL
INTRAVENOUS | Status: DC | PRN
  Administered 2022-03-03: 30 ug/kg/min via INTRAVENOUS

## 2022-03-03 MED ORDER — SODIUM CHLORIDE 0.9 % IR SOLN
Status: DC | PRN
  Administered 2022-03-03 (×2): 3000 mL
  Administered 2022-03-03: 6000 mL
  Administered 2022-03-03: 3000 mL

## 2022-03-03 MED ORDER — SCOPOLAMINE 1 MG/3DAYS TD PT72
1.0000 | MEDICATED_PATCH | TRANSDERMAL | Status: DC
  Administered 2022-03-03: 1 via TRANSDERMAL

## 2022-03-03 MED ORDER — EPINEPHRINE PF 1 MG/ML IJ SOLN
INTRAMUSCULAR | Status: AC
  Filled 2022-03-03: qty 3

## 2022-03-03 MED ORDER — ONDANSETRON HCL 4 MG/2ML IJ SOLN
INTRAMUSCULAR | Status: DC | PRN
  Administered 2022-03-03: 4 mg via INTRAVENOUS

## 2022-03-03 MED ORDER — BUPIVACAINE LIPOSOME 1.3 % IJ SUSP
INTRAMUSCULAR | Status: DC | PRN
  Administered 2022-03-03: 10 mL via PERINEURAL

## 2022-03-03 MED ORDER — ROCURONIUM BROMIDE 10 MG/ML (PF) SYRINGE
PREFILLED_SYRINGE | INTRAVENOUS | Status: DC | PRN
  Administered 2022-03-03: 60 mg via INTRAVENOUS

## 2022-03-03 MED ORDER — SODIUM CHLORIDE 0.9 % IR SOLN
Status: DC | PRN
  Administered 2022-03-03: 6000 mL

## 2022-03-03 MED ORDER — PROPOFOL 10 MG/ML IV BOLUS
INTRAVENOUS | Status: DC | PRN
  Administered 2022-03-03: 150 mg via INTRAVENOUS

## 2022-03-03 MED ORDER — MIDAZOLAM HCL 2 MG/2ML IJ SOLN
INTRAMUSCULAR | Status: DC | PRN
  Administered 2022-03-03: 2 mg via INTRAVENOUS

## 2022-03-03 MED ORDER — FENTANYL CITRATE (PF) 250 MCG/5ML IJ SOLN
INTRAMUSCULAR | Status: DC | PRN
  Administered 2022-03-03: 50 ug via INTRAVENOUS

## 2022-03-03 MED ORDER — SCOPOLAMINE 1 MG/3DAYS TD PT72
MEDICATED_PATCH | TRANSDERMAL | Status: AC
  Filled 2022-03-03: qty 1

## 2022-03-03 MED ORDER — CHLORHEXIDINE GLUCONATE 0.12 % MT SOLN
OROMUCOSAL | Status: AC
  Filled 2022-03-03: qty 15

## 2022-03-03 SURGICAL SUPPLY — 76 items
ANCH SUT 2 1.5X2.9 2 LD (Anchor) ×2 IMPLANT
ANCH SUT 2.9 KNTLS QUATTRO (Anchor) ×2 IMPLANT
ANCH SUT KNTLS STRL SHLDR SYS (Anchor) ×1 IMPLANT
ANCHOR JUGGERKNOT 2X2.9 WH/BL (Anchor) IMPLANT
ANCHOR QUATTRO LINK KNTLS 2.9 (Anchor) IMPLANT
ANCHOR SUT BIOCOMP CORKSREW (Anchor) IMPLANT
ANCHOR SUT QUATTRO KNTLS 4.5 (Anchor) IMPLANT
ANCHOR SUT SWIVELLOK BIO (Anchor) ×1 IMPLANT
APL PRP STRL LF DISP 70% ISPRP (MISCELLANEOUS) ×1
BAG COUNTER SPONGE SURGICOUNT (BAG) ×1 IMPLANT
BAG SPNG CNTER NS LX DISP (BAG) ×1
BIT DRILL JUGGERKNOT 2.9 (BIT) IMPLANT
BLADE CUTTER GATOR 3.5 (BLADE) IMPLANT
BLADE EXCALIBUR 4.0X13 (MISCELLANEOUS) IMPLANT
BLADE SURG 11 STRL SS (BLADE) IMPLANT
BUR OVAL 6.0 (BURR) IMPLANT
CHLORAPREP W/TINT 26 (MISCELLANEOUS) ×1 IMPLANT
COOLER ICEMAN CLASSIC (MISCELLANEOUS) IMPLANT
DRAPE INCISE IOBAN 66X45 STRL (DRAPES) ×1 IMPLANT
DRAPE STERI 35X30 U-POUCH (DRAPES) ×1 IMPLANT
DRAPE U-SHAPE 47X51 STRL (DRAPES) ×2 IMPLANT
DRSG TEGADERM 4X4.75 (GAUZE/BANDAGES/DRESSINGS) ×4 IMPLANT
DRSG XEROFORM 1X8 (GAUZE/BANDAGES/DRESSINGS) IMPLANT
DW OUTFLOW CASSETTE/TUBE SET (MISCELLANEOUS) IMPLANT
ELECT REM PT RETURN 9FT ADLT (ELECTROSURGICAL) ×1
ELECTRODE REM PT RTRN 9FT ADLT (ELECTROSURGICAL) ×1 IMPLANT
FILTER STRAW FLUID ASPIR (MISCELLANEOUS) ×1 IMPLANT
GAUZE PAD ABD 8X10 STRL (GAUZE/BANDAGES/DRESSINGS) IMPLANT
GAUZE SPONGE 4X4 12PLY STRL (GAUZE/BANDAGES/DRESSINGS) ×1 IMPLANT
GAUZE XEROFORM 1X8 LF (GAUZE/BANDAGES/DRESSINGS) ×1 IMPLANT
GLOVE BIOGEL PI IND STRL 6.5 (GLOVE) ×1 IMPLANT
GLOVE BIOGEL PI IND STRL 8 (GLOVE) ×1 IMPLANT
GLOVE ECLIPSE 6.0 STRL STRAW (GLOVE) ×1 IMPLANT
GLOVE INDICATOR 8.0 STRL GRN (GLOVE) ×1 IMPLANT
GLOVE SURG LTX SZ8 (GLOVE) ×1 IMPLANT
GOWN STRL REUS W/ TWL LRG LVL3 (GOWN DISPOSABLE) ×3 IMPLANT
GOWN STRL REUS W/TWL LRG LVL3 (GOWN DISPOSABLE) ×3
KIT BASIN OR (CUSTOM PROCEDURE TRAY) ×1 IMPLANT
KIT TURNOVER KIT B (KITS) ×1 IMPLANT
MANIFOLD NEPTUNE II (INSTRUMENTS) ×1 IMPLANT
NDL HYPO 25X1 1.5 SAFETY (NEEDLE) IMPLANT
NDL SPNL 18GX3.5 QUINCKE PK (NEEDLE) ×1 IMPLANT
NDL SUT 6 .5 CRC .975X.05 MAYO (NEEDLE) ×1 IMPLANT
NDL SUT PASSER RTC (NEEDLE) IMPLANT
NEEDLE HYPO 25X1 1.5 SAFETY (NEEDLE) IMPLANT
NEEDLE MAYO TAPER (NEEDLE) ×1
NEEDLE SPNL 18GX3.5 QUINCKE PK (NEEDLE) ×1 IMPLANT
NEEDLE SUT PASSER RTC (NEEDLE) ×1 IMPLANT
NS IRRIG 1000ML POUR BTL (IV SOLUTION) ×1 IMPLANT
PACK SHOULDER (CUSTOM PROCEDURE TRAY) ×1 IMPLANT
PAD ARMBOARD 7.5X6 YLW CONV (MISCELLANEOUS) ×2 IMPLANT
PAD COLD SHLDR WRAP-ON (PAD) IMPLANT
PORT APPOLLO RF 90DEGREE MULTI (SURGICAL WAND) IMPLANT
SLING ARM IMMOBILIZER MED (SOFTGOODS) IMPLANT
SPONGE T-LAP 4X18 ~~LOC~~+RFID (SPONGE) ×2 IMPLANT
STRIP CLOSURE SKIN 1/2X4 (GAUZE/BANDAGES/DRESSINGS) IMPLANT
SUCTION FRAZIER HANDLE 10FR (MISCELLANEOUS)
SUCTION TUBE FRAZIER 10FR DISP (MISCELLANEOUS) IMPLANT
SUT BROADBAND TAPE 2PK 1.5 (SUTURE) IMPLANT
SUT ETHILON 3 0 PS 1 (SUTURE) ×1 IMPLANT
SUT FIBERWIRE 2-0 18 17.9 3/8 (SUTURE)
SUT VIC AB 0 CT1 27 (SUTURE) ×1
SUT VIC AB 0 CT1 27XBRD ANBCTR (SUTURE) ×1 IMPLANT
SUT VIC AB 1 CT1 27 (SUTURE)
SUT VIC AB 1 CT1 27XBRD ANBCTR (SUTURE) IMPLANT
SUT VIC AB 2-0 CT1 27 (SUTURE) ×1
SUT VIC AB 2-0 CT1 TAPERPNT 27 (SUTURE) ×1 IMPLANT
SUT VICRYL 0 UR6 27IN ABS (SUTURE) ×1 IMPLANT
SUTURE FIBERWR 2-0 18 17.9 3/8 (SUTURE) IMPLANT
SYR 20ML LL LF (SYRINGE) ×1 IMPLANT
SYR 3ML LL SCALE MARK (SYRINGE) ×1 IMPLANT
SYR TB 1ML LUER SLIP (SYRINGE) ×1 IMPLANT
TOWEL GREEN STERILE (TOWEL DISPOSABLE) ×1 IMPLANT
TOWEL GREEN STERILE FF (TOWEL DISPOSABLE) ×1 IMPLANT
TUBING ARTHROSCOPY IRRIG 16FT (MISCELLANEOUS) ×1 IMPLANT
WATER STERILE IRR 1000ML POUR (IV SOLUTION) ×1 IMPLANT

## 2022-03-03 NOTE — Anesthesia Procedure Notes (Signed)
Procedure Name: Intubation Date/Time: 03/03/2022 7:45 AM  Performed by: Elvin So, CRNAPre-anesthesia Checklist: Patient identified, Emergency Drugs available, Suction available and Patient being monitored Patient Re-evaluated:Patient Re-evaluated prior to induction Oxygen Delivery Method: Circle System Utilized Preoxygenation: Pre-oxygenation with 100% oxygen Induction Type: IV induction Ventilation: Mask ventilation without difficulty Laryngoscope Size: Mac and 3 Grade View: Grade I Tube type: Oral Tube size: 7.0 mm Number of attempts: 1 Airway Equipment and Method: Stylet and Oral airway Placement Confirmation: ETT inserted through vocal cords under direct vision, positive ETCO2 and breath sounds checked- equal and bilateral Secured at: 21 cm Tube secured with: Tape Dental Injury: Teeth and Oropharynx as per pre-operative assessment

## 2022-03-03 NOTE — Anesthesia Postprocedure Evaluation (Signed)
Anesthesia Post Note  Patient: ANETA HENDERSHOTT  Procedure(s) Performed: RIGHT SHOULDER ARTHROSCOPY WITH ROTATOR CUFF REPAIR AND BICEPS TENODESIS (Right: Shoulder)     Patient location during evaluation: PACU Anesthesia Type: General Level of consciousness: awake and alert Pain management: pain level controlled Vital Signs Assessment: post-procedure vital signs reviewed and stable Respiratory status: spontaneous breathing, nonlabored ventilation, respiratory function stable and patient connected to nasal cannula oxygen Cardiovascular status: blood pressure returned to baseline and stable Postop Assessment: no apparent nausea or vomiting Anesthetic complications: no  No notable events documented.  Last Vitals:  Vitals:   03/03/22 1030 03/03/22 1045  BP: (!) 106/90 112/77  Pulse: 72 70  Resp: 10 10  Temp: 36.5 C 36.5 C  SpO2: 100% 99%    Last Pain:  Vitals:   03/03/22 1045  TempSrc:   PainSc: 0-No pain                 Kennieth Rad

## 2022-03-03 NOTE — Anesthesia Procedure Notes (Signed)
Anesthesia Regional Block: Interscalene brachial plexus block   Pre-Anesthetic Checklist: , timeout performed,  Correct Patient, Correct Site, Correct Laterality,  Correct Procedure, Correct Position, site marked,  Risks and benefits discussed,  Surgical consent,  Pre-op evaluation,  At surgeon's request and post-op pain management  Laterality: Right  Prep: chloraprep       Needles:  Injection technique: Single-shot  Needle Type: Echogenic Stimulator Needle     Needle Length: 9cm  Needle Gauge: 21     Additional Needles:   Procedures:, nerve stimulator,,, ultrasound used (permanent image in chart),,     Nerve Stimulator or Paresthesia:  Response: deltoid, 0.5 mA  Additional Responses:   Narrative:  Start time: 03/03/2022 7:14 AM End time: 03/03/2022 7:20 AM Injection made incrementally with aspirations every 5 mL.  Performed by: Personally  Anesthesiologist: Marcene Duos, MD

## 2022-03-03 NOTE — Op Note (Addendum)
Date of Surgery: 03/03/2022  INDICATIONS: [name] is a 56 y.o.-year-old female with a near full-thickness rotator cuff tear which is failed conservative management and biceps tendinitis.  The risk and benefits of the procedure were discussed in detail and documented in the pre-operative evaluation.   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  POSTOPERATIVE DIAGNOSIS: Same.  PROCEDURE: Arthroscopic limited debridement [id number] Arthroscopic rotator cuff repair - [id number] Arthroscopic biceps tenodesis - [id number] Subacromial debridement, acromioplasty - 23130  SURGEON: Yevonne Pax MD  ASSISTANT: Raynelle Fanning, ATC  ANESTHESIA:  general plus interscalene nerve block  IV FLUIDS AND URINE: See anesthesia record.  ANTIBIOTICS: Ancef  ESTIMATED BLOOD LOSS: 5 mL.  IMPLANTS:  Implant Name Type Inv. Item Serial No. Manufacturer Lot No. LRB No. Used Action  ANCHOR QUATTRO LINK KNTLS 2.9 - [id number] Anchor ANCHOR QUATTRO LINK KNTLS 2.9  [employer](ORTH,TRAU,BIO,SG) [id number] Right 2 Implanted  ANCHOR JUGGERKNOT 2X2.9 WH/BL - [id number] Anchor ANCHOR JUGGERKNOT 2X2.9 WH/BL  [employer](ORTH,TRAU,BIO,SG) [id number] Right 1 Implanted  ANCHOR SUT QUATTRO KNTLS 4.5 - [id number] Anchor ANCHOR SUT QUATTRO KNTLS 4.5  [employer](ORTH,TRAU,BIO,SG) [id number] Right 1 Implanted  ANCHOR QUATTRO LINK KNTLS 2.9 - [id number] Anchor ANCHOR QUATTRO LINK KNTLS 2.9  [employer](ORTH,TRAU,BIO,SG)  Right 1 Implanted    DRAINS: None  CULTURES: None  COMPLICATIONS: none  PROCEDURE:    OPERATIVE FINDING: Exam under anesthesia:   Examination under anesthesia revealed forward elevation of 150 degrees.  With the arm at the side, there was 65 degrees of external rotation.  There is a 1+ anterior load shift and a 1+ posterior load shift.    Arthroscopic findings demonstrated: Articular space: Normal Chondral surfaces: Normal Biceps: Redness and injection at  the insertion Subscapularis: Intact Supraspinatus: 90% articular sided tear Infraspinatus: Intact    I identified the patient in the pre-operative holding area.  I marked the operative right shoulder with my initials. I reviewed the risks and benefits of the proposed surgical intervention and the patient wished to proceed.  Anesthesia was then performed with regional block.  The patient was transferred to the operative suite and placed in the beach chair position with all bony prominences padded.     SCDs were placed on bilateral lower extremity. Appropriate antibiotics was administered within 1 hour before incision.  Anesthesia was induced.  The operative extremity was then prepped and draped in standard fashion. A time out was performed confirming the correct extremity, correct patient and correct procedure.   The arthroscope was introduced in the glenohumeral joint from a posterior portal.  An anterior portal was created.  The shoulder was examined and the above findings were noted.     With an arthroscopic shaver and a wand ablator, synovitis throughout the  shoulder was resected.  The arthroscopic shaver was used to excise torn portions of the labrum back to a stable margin. Specifically this was done for the anterior superior and posterior labrum.    At this time the biceps was tagged with a self passing device with a #2 nonabsorbable tape suture.  This was done through the anterior portal.  The suture was then passed again through the biceps in a ripstop loop fashion.  This was placed into a anterior to 9 Quatro anchor.  This had excellent tensioning and reapproximation of the biceps into the native groove.   The rotator cuff was then approached through the subacromial space.  Subacromial decompression was performed with a combination of arthroscopic wand  and 4 mm shaver on the undersurface of the acromion.  Anterior, lateral, and posterior portals were used.  Bursectomy was performed with an  arthroscopic shaver and ArthroCare wand.  The soft tissues on the undersurface of the acromion and clavicle were resected with the arthroscopic shaver and wand. There was good excursion that was noted of the tendon back to its footprint which would be amendable to an repair.   The rotator cuff was repaired with a double row configuration with one medial row all suture suture anchors as noted above with sutures passed from anterior to posterior in horizontal fashion with a self passing suture device. A total of 8 limbs of suture were passed.  The sutures were placed into 1 anterior and 1 posterior row anchor.  This provided excellent anatomic footprint approximation.   The shoulder was irrigated.  The arthroscopic instruments were removed.  Wounds were closed with 3-0 nylon sutures.  A sterile dressing was applied with xeroform, 4x8s, abdominal pad, and tape. An Gar Gibbon was placed and the upper extremity was placed in a shoulder immobilizer.  The patient tolerated the procedure well and was taken to the recovery room in stable condition.  All counts were correct in the case. The patient tolerated the procedure well and was taken to the recovery room in stable condition.    POSTOPERATIVE PLAN: She will be nonweightbearing on the right arm in a sling until physical therapy.  She will follow the rotator cuff repair protocol.  I will plan to see her back in 2 weeks for suture removal.  Yevonne Pax, MD 9:57 AM

## 2022-03-03 NOTE — Discharge Instructions (Signed)
     Discharge Instructions    Attending Surgeon: Ryiah Bellissimo, MD Office Phone Number: [phone]   Diagnosis and Procedures:    Surgeries Performed: Right shoulder rotator cuff repair with biceps tenodesis  Discharge Plan:    Diet: Resume usual diet. Begin with light or bland foods.  Drink plenty of fluids.  Activity:  Keep sling and dressing in place until your follow up visit in Physical Therapy You are advised to go home directly from the hospital or surgical center. Restrict your activities.  GENERAL INSTRUCTIONS: 1.  Keep your surgical site elevated above your heart for at least 5-7 days or longer to prevent swelling. This will improve your comfort and your overall recovery following surgery.     2. Please call Dr. Rily Nickey's office at [phone] with questions Monday-Friday during business hours. If no one answers, please leave a message and someone should get back to the patient within 24 hours. For emergencies please call 911 or proceed to the emergency room.   3. Patient to notify surgical team if experiences any of the following: Bowel/Bladder dysfunction, uncontrolled pain, nerve/muscle weakness, incision with increased drainage or redness, nausea/vomiting and Fever greater than 101.0 F.  Be alert for signs of infection including redness, streaking, odor, fever or chills. Be alert for excessive pain or bleeding and notify your surgeon immediately.  WOUND INSTRUCTIONS:   Leave your dressing/cast/splint in place until your post operative visit.  Keep it clean and dry.  Always keep the incision clean and dry until the staples/sutures are removed. If there is no drainage from the incision you should keep it open to air. If there is drainage from the incision you must keep it covered at all times until the drainage stops  Do not soak in a bath tub, hot tub, pool, lake or other body of water until 21 days after your surgery and your incision is completely dry and  healed.  If you have removable sutures (or staples) they must be removed 10-14 days (unless otherwise instructed) from the day of your surgery.     1)  Elevate the extremity as much as possible.  2)  Keep the dressing clean and dry.  3)  Please call us if the dressing becomes wet or dirty.  4)  If you are experiencing worsening pain or worsening swelling, please call.     MEDICATIONS: Resume all previous home medications at the previous prescribed dose and frequency unless otherwise noted Start taking the  pain medications on an as-needed basis as prescribed  Please taper down pain medication over the next week following surgery.  Ideally you should not require a refill of any narcotic pain medication.  Take pain medication with food to minimize nausea. In addition to the prescribed pain medication, you may take over-the-counter pain relievers such as Tylenol.  Do NOT take additional tylenol if your pain medication already has tylenol in it.  Aspirin 325mg daily for four weeks.      FOLLOWUP INSTRUCTIONS: 1. Follow up at the Physical Therapy Clinic 3-4 days following surgery. This appointment should be scheduled unless other arrangements have been made.The Physical Therapy scheduling number is [phone] if an appointment has not already been arranged.  2. Contact Dr. Linn Clavin's office during office hours at [phone] or the practice after hours line at [phone] for non-emergencies. For medical emergencies call 911.   Discharge Location: Home  

## 2022-03-03 NOTE — Brief Op Note (Signed)
   Brief Op Note  Date of Surgery: 03/03/2022  Preoperative Diagnosis: RIGHT ROTATOR CUFF TEAR  Postoperative Diagnosis: same  Procedure: Procedure(s): RIGHT SHOULDER ARTHROSCOPY WITH ROTATOR CUFF REPAIR AND BICEPS TENODESIS  Implants: Implant Name Type Inv. Item Serial No. Manufacturer Lot No. LRB No. Used Action  ANCHOR QUATTRO LINK KNTLS 2.9 - [id number] Anchor ANCHOR QUATTRO LINK KNTLS 2.9  [employer](ORTH,TRAU,BIO,SG) [id number] Right 2 Implanted  ANCHOR JUGGERKNOT 2X2.9 WH/BL - [id number] Anchor ANCHOR JUGGERKNOT 2X2.9 WH/BL  [employer](ORTH,TRAU,BIO,SG) [id number] Right 1 Implanted  ANCHOR SUT QUATTRO KNTLS 4.5 - [id number] Anchor ANCHOR SUT QUATTRO KNTLS 4.5  [employer](ORTH,TRAU,BIO,SG) [id number] Right 1 Implanted  ANCHOR QUATTRO LINK KNTLS 2.9 - [id number] Anchor ANCHOR QUATTRO LINK KNTLS 2.9  [employer](ORTH,TRAU,BIO,SG)  Right 1 Implanted    Surgeons: Surgeon(s): Huel Cote, MD  Anesthesia: General    Estimated Blood Loss: See anesthesia record  Complications: None  Condition to PACU: Stable  Benancio Deeds, MD 03/03/2022 9:53 AM

## 2022-03-03 NOTE — H&P (Signed)
Chief Complaint: Right shoulder pain        History of Present Illness:    01/14/2022: Presents today for follow-up and further discussion of her right shoulder.  At this time she is continuing to remain symptomatic.  She has specific questions regarding the surgery.  Her pain has been more flared up after she was doing yard work 1 day prior.   Destiny Gardner is a 56 y.o. female right hand dominant female comes in today with multiple years of right shoulder pain.  She does have a known rotator cuff tear which appears to be progressing based on previous imaging from [facility].  She more recently was seeing Dr. Georgina Gardner who has referred her to me for further assessment.  She has done physical therapy now for over a month with limited relief.  She did have 1 injection which she says did not give her any significant relief.  She is hesitant to have an additional injection.  She is overall very active.  She enjoys activities like glass blowing which she has a very difficult time doing with the shoulder.  She states that she is at baseline at a 1 out of 10 pain but this can ramp up to a 6 out of 10 pain with certain activities.  This is quite frustrating for her as she cannot reliably predict what issues will set the shoulder off.  She is here today for further discussion.       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,now with constipation 2010         Past Surgical History:  Procedure Laterality Date   COLONOSCOPY   3/11    TCS/EGD :noraml stomach,colon,and duodenum   ESOPHAGOGASTRODUODENOSCOPY   3/11    with TCS   LAPAROTOMY   1999    endometriosis   MYRINGOTOMY        Tubes,Tympanoplasty   RHINOPLASTY       Tonillectomy        Social History         Socioeconomic History   Marital status:  Married      Spouse name: Not on file   Number of children: Not on file   Years of education: Not on file   Highest education level: Not on file  Occupational History   Not on file  Tobacco Use   Smoking status: Never   Smokeless tobacco: Never  Substance and Sexual Activity   Alcohol use: Yes      Alcohol/week: 0.0 standard drinks of alcohol      Comment: social   Drug use: No   Sexual activity: Not on file  Other Topics Concern   Not on file  Social History Narrative   Not on file    Social Determinants of Health    Financial Resource Strain: Not on file  Food Insecurity: Not on file  Transportation Needs: Not on file  Physical Activity: Not on file  Stress: Not on file  Social Connections: Not on file         Family History  Problem Relation Age of Onset   COPD Mother          never smoker   Prostate cancer Father  Allergies  Allergen Reactions   Codeine     Ketorolac Tromethamine     Penicillins            Current Outpatient Medications  Medication Sig Dispense Refill   acetaminophen (TYLENOL) 500 MG tablet Take 1 tablet (500 mg total) by mouth every 8 (eight) hours for 10 days. 30 tablet 0   aspirin EC 325 MG tablet Take 1 tablet (325 mg total) by mouth daily. 30 tablet 0   ibuprofen (ADVIL) 800 MG tablet Take 1 tablet (800 mg total) by mouth every 8 (eight) hours for 10 days. Please take with food, please alternate with acetaminophen 30 tablet 0   oxyCODONE (OXY IR/ROXICODONE) 5 MG immediate release tablet Take 1 tablet (5 mg total) by mouth every 4 (four) hours as needed (severe pain). 10 tablet 0    No current facility-administered medications for this visit.    Imaging Results (Last 48 hours)  No results found.     Review of Systems:   A ROS was performed including pertinent positives and negatives as documented in the HPI.   Physical Exam :   Constitutional: NAD and appears stated age Neurological: Alert and oriented Psych: Appropriate  affect and cooperative There were no vitals taken for this visit.    Comprehensive Musculoskeletal Exam:     Musculoskeletal Exam      Inspection Right Left  Skin No atrophy or winging No atrophy or winging  Palpation      Tenderness Greater tuberosity, biceps None  Range of Motion      Flexion (passive) 170 170  Flexion (active) 170 170  Abduction 170 170  ER at the side 70 70  Can reach behind back to T12 T12  Strength        Full with pain Full  Special Tests      Pseudoparalytic No No  Neurologic      Fires PIN, radial, median, ulnar, musculocutaneous, axillary, suprascapular, long thoracic, and spinal accessory innervated muscles. No abnormal sensibility  Vascular/Lymphatic      Radial Pulse 2+ 2+  Cervical Exam      Patient has symmetric cervical range of motion with negative Spurling's test.  Special Test: Positive Neer impingement        Imaging:   Xray (3 views right shoulder): Normal   MRI (right shoulder): there is approximately 80% articular sided tearing of the supraspinatus with healthy-appearing muscle belly.  There is tearing of the biceps tendon as well.  Glenohumeral joint appears to be intact   I personally reviewed and interpreted the radiographs.     Assessment:   56 y.o. female right-hand-dominant female with a right nearly full-thickness articular sided rotator cuff tear of the right shoulder.  Given the fact that she has extensively tried therapy as well as an injection without relief I do believe that she would be a surgical candidate.  I did specifically discussed natural progression of rotator cuff disease and how this likely would progress to a complete tear.  That effect she understands that her repair can prevent progression and ultimately help her with her pain.  We did discuss nonsurgical management including additional injections and therapy although she has already trialed this and would like to defer additional injections.  I did discuss  similarly with Dr. Denyse Gardner that PRP is less likely to be effective in the setting of a tear although this would be a treatment option.  At this time after further discussion she would like  to pursue right shoulder arthroscopy with rotator cuff repair biceps tenodesis Plan :     -Plan for right shoulder arthroscopy with rotator cuff repair and biceps tenodesis     After a lengthy discussion of treatment options, including risks, benefits, alternatives, complications of surgical and nonsurgical conservative options, the patient elected surgical repair.    The patient  is aware of the material risks  and complications including, but not limited to injury to adjacent structures, neurovascular injury, infection, numbness, bleeding, implant failure, thermal burns, stiffness, persistent pain, failure to heal, disease transmission from allograft, need for further surgery, dislocation, anesthetic risks, blood clots, risks of death,and others. The probabilities of surgical success and failure discussed with patient given their particular co-morbidities.The time and nature of expected rehabilitation and recovery was discussed.The patient's questions were all answered preoperatively.  No barriers to understanding were noted. I explained the natural history of the disease process and Rx rationale.  I explained to the patient what I considered to be reasonable expectations given their personal situation.  The final treatment plan was arrived at through a shared patient decision making process model.        I personally saw and evaluated the patient, and participated in the management and treatment plan.   Vanetta Mulders, MD Attending Physician, Orthopedic Surgery   This document was dictated using Dragon voice recognition software. A reasonable attempt at proof reading has been made to minimize errors.  Attention patient

## 2022-03-03 NOTE — Transfer of Care (Signed)
Immediate Anesthesia Transfer of Care Note  Patient: Destiny Gardner  Procedure(s) Performed: RIGHT SHOULDER ARTHROSCOPY WITH ROTATOR CUFF REPAIR AND BICEPS TENODESIS (Right: Shoulder)  Patient Location: PACU  Anesthesia Type:General and Regional  Level of Consciousness: awake and patient cooperative  Airway & Oxygen Therapy: Patient Spontanous Breathing and Patient connected to face mask oxygen  Post-op Assessment: Report given to RN, Post -op Vital signs reviewed and stable, and Patient moving all extremities  Post vital signs: Reviewed and stable  Last Vitals:  Vitals Value Taken Time  BP 125/72 03/03/22 1003  Temp    Pulse 78 03/03/22 1004  Resp 11 03/03/22 1004  SpO2 100 % 03/03/22 1004  Vitals shown include unvalidated device data.  Last Pain:  Vitals:   03/03/22 0620  TempSrc:   PainSc: 0-No pain      Patients Stated Pain Goal: 0 (03/03/22 2951)  Complications: No notable events documented.

## 2022-03-03 NOTE — Interval H&P Note (Signed)
History and Physical Interval Note:  03/03/2022 6:52 AM  Destiny Gardner  has presented today for surgery, with the diagnosis of RIGHT ROTATOR CUFF TEAR.  The various methods of treatment have been discussed with the patient and family. After consideration of risks, benefits and other options for treatment, the patient has consented to  Procedure(s): RIGHT SHOULDER ARTHROSCOPY WITH ROTATOR CUFF REPAIR AND BICEPS TENODESIS (Right) as a surgical intervention.  The patient's history has been reviewed, patient examined, no change in status, stable for surgery.  I have reviewed the patient's chart and labs.  Questions were answered to the patient's satisfaction.     Huel Cote

## 2022-03-04 ENCOUNTER — Encounter (HOSPITAL_BASED_OUTPATIENT_CLINIC_OR_DEPARTMENT_OTHER): Payer: Self-pay | Admitting: Orthopaedic Surgery

## 2022-03-04 ENCOUNTER — Encounter (HOSPITAL_COMMUNITY): Payer: Self-pay | Admitting: Orthopaedic Surgery

## 2022-03-04 NOTE — Telephone Encounter (Signed)
Sent message to patient of yours.  Not sure if you have any other things you want to add

## 2022-03-05 ENCOUNTER — Encounter (HOSPITAL_BASED_OUTPATIENT_CLINIC_OR_DEPARTMENT_OTHER): Payer: Self-pay | Admitting: Orthopaedic Surgery

## 2022-03-08 ENCOUNTER — Ambulatory Visit (HOSPITAL_BASED_OUTPATIENT_CLINIC_OR_DEPARTMENT_OTHER): Payer: BC Managed Care – PPO | Attending: Family Medicine | Admitting: Physical Therapy

## 2022-03-08 ENCOUNTER — Encounter (HOSPITAL_BASED_OUTPATIENT_CLINIC_OR_DEPARTMENT_OTHER): Payer: Self-pay | Admitting: Physical Therapy

## 2022-03-08 DIAGNOSIS — M7581 Other shoulder lesions, right shoulder: Secondary | ICD-10-CM | POA: Insufficient documentation

## 2022-03-08 DIAGNOSIS — M6281 Muscle weakness (generalized): Secondary | ICD-10-CM | POA: Insufficient documentation

## 2022-03-08 DIAGNOSIS — G8929 Other chronic pain: Secondary | ICD-10-CM | POA: Diagnosis not present

## 2022-03-08 DIAGNOSIS — M25611 Stiffness of right shoulder, not elsewhere classified: Secondary | ICD-10-CM | POA: Insufficient documentation

## 2022-03-08 DIAGNOSIS — Z5189 Encounter for other specified aftercare: Secondary | ICD-10-CM | POA: Diagnosis not present

## 2022-03-08 DIAGNOSIS — M25511 Pain in right shoulder: Secondary | ICD-10-CM | POA: Diagnosis not present

## 2022-03-08 NOTE — Therapy (Signed)
OUTPATIENT PHYSICAL THERAPY SHOULDER EVALUATION   Patient Name: Destiny Gardner MRN: [id number] DOB:1965/05/18, 57 y.o., female Today's Date: 03/08/2022  END OF SESSION:  PT End of Session - 03/08/22 1140     Visit Number 1    Number of Visits 16    Date for PT Re-Evaluation 05/03/22    PT Start Time 0930    PT Stop Time 1016    PT Time Calculation (min) 46 min    Activity Tolerance Patient tolerated treatment well    Behavior During Therapy [facility] for tasks assessed/performed             Past Medical History:  Diagnosis Date   Complication of anesthesia    post-op N/V, waking up during rhinoplasty and colonoscopy   Endometriosis    Headache    Hx Occular Migraine   Inflammatory bowel disease    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,now with constipation 2010   PONV (postoperative nausea and vomiting)    Past Surgical History:  Procedure Laterality Date   COLONOSCOPY  05/05/2009   TCS/EGD :noraml stomach,colon,and duodenum   COLONOSCOPY  2020   ESOPHAGOGASTRODUODENOSCOPY  05/05/2009   with TCS   HERNIA REPAIR  6387   Umbilical Hernia   LAPAROTOMY  03/07/1997   endometriosis   MYRINGOTOMY     Tubes,Tympanoplasty   RHINOPLASTY  1989   SHOULDER ARTHROSCOPY WITH ROTATOR CUFF REPAIR AND OPEN BICEPS TENODESIS Right 03/03/2022   Procedure: RIGHT SHOULDER ARTHROSCOPY WITH ROTATOR CUFF REPAIR AND BICEPS TENODESIS;  Surgeon: Vanetta Mulders, MD;  Location: [place];  Service: Orthopedics;  Laterality: Right;   [place] EXTRACTION     Patient Active Problem List   Diagnosis Date Noted   Tendinitis of right rotator cuff 03/03/2022   Biceps tendinitis of right shoulder 03/03/2022   Cough 05/18/2015   ABDOMINAL PAIN 05/26/2009   CHANGE IN BOWELS 01/27/2009   CONSTIPATION, CHRONIC 12/11/2008   ENDOMETRIOSIS 12/11/2008   IRRITABLE BOWEL SYNDROME, HX OF 12/11/2008    PCP: Oliver Pila FNP   REFERRING PROVIDER: Dr Vanetta Mulders   REFERRING DIAG:   Diagnosis  M75.81 (ICD-10-CM) - Tendinitis of right rotator cuff  RTC repair   THERAPY DIAG:  Chronic right shoulder pain  Muscle weakness (generalized)  Stiffness of right shoulder, not elsewhere classified  Rationale for Evaluation and Treatment: Rehabilitation  ONSET DATE: 03/03/2022  SUBJECTIVE:                                                                                                                                                                                      SUBJECTIVE STATEMENT: Patient has long history of  right shoulder pain.  She has had physical therapy a few times with no relief.  She underwent a right rotator cuff repair with biceps tenodesis on 03/03/2022.  She is currently compliant with sling use.  Her pain is well-controlled at this time.  PERTINENT HISTORY: Ocular Migranes  PAIN:  Are you having pain? Yes: NPRS scale: 3-4/10 Pain location: right anterior shoulder  Pain description: right shoulder pain  Aggravating factors: use of the shoulder/ sleeping  Relieving factors: rest, tylenol   PRECAUTIONS: Shoulder follow protocol   WEIGHT BEARING RESTRICTIONS: Yes NWB   FALLS:  Has patient fallen in last 6 months? No  LIVING ENVIRONMENT:  OCCUPATION: Stay at home mom.   Hobbies: work around [employer] doing house rehab, cooking.  PLOF: Independent  PATIENT GOALS:    NEXT MD VISIT:   OBJECTIVE:   DIAGNOSTIC FINDINGS:  Nothing post op PATIENT SURVEYS:  FOTO    COGNITION: Overall cognitive status: Within functional limits for tasks assessed     SENSATION: A little numbness in the hand but it has improved   POSTURE: Good.   UPPER EXTREMITY ROM:   Passive ROM Right eval Left eval  Shoulder flexion 75 No limit  Shoulder extension    Shoulder abduction    Shoulder adduction    Shoulder internal rotation 45 No limit      Shoulder external rotation 5 No limit   Elbow flexion    Elbow extension    Wrist flexion    Wrist  extension    Wrist ulnar deviation    Wrist radial deviation    Wrist pronation    Wrist supination    (Blank rows = not tested)  UPPER EXTREMITY MMT:  MMT Right eval Left eval  Shoulder flexion    Shoulder extension    Shoulder abduction    Shoulder adduction    Shoulder internal rotation    Shoulder external rotation    Middle trapezius    Lower trapezius    Elbow flexion    Elbow extension    Wrist flexion    Wrist extension    Wrist ulnar deviation    Wrist radial deviation    Wrist pronation    Wrist supination    Grip strength (lbs)    (Blank rows = not tested) not tested at this time secondary to recent surgery    PALPATION:  No unexpected tenderness ot palpation    TODAY'S TREATMENT:                                                                                                                                         DATE:   Manual: Gentle passive range of motion per protocol limits.  Reviewed pendulums patient reported minor pain.  Patient advised to try again later or tomorrow. Reviewed wrist and grip strengthening options.  PATIENT EDUCATION: Education details: HEP, symptom management, progression of  activity.  Importance of sling use. Person educated: Patient Education method: Explanation, Demonstration, Tactile cues, Verbal cues, and Handouts Education comprehension: verbalized understanding, returned demonstration, verbal cues required, tactile cues required, and needs further education  HOME EXERCISE PROGRAM: Access Code: [email or web address] URL: [email or web address] Date: 03/08/2022 Prepared by: Carolyne Littles  Exercises - Circular Shoulder Pendulum with Table Support  - 3 x daily - 7 x weekly - 1 sets - 10 reps - Wrist Extension AROM  - 1 x daily - 7 x weekly - 3 sets - 10 reps  ASSESSMENT:  CLINICAL IMPRESSION: Patient is a 57 year old female status post right rotator cuff repair with right biceps tenodesis.  She presents with  expected limitations in motion, function, and strength.  She has been compliant with sling use.  Her pain is well-controlled.  She is right-hand dominant.  She would benefit from skilled therapy to return to full functional use of dominant hand. OBJECTIVE IMPAIRMENTS: decreased activity tolerance, decreased mobility, decreased ROM, decreased strength, impaired UE functional use, and pain.   ACTIVITY LIMITATIONS: {activitylimitations:27494}  PARTICIPATION LIMITATIONS: {participationrestrictions:25113}  PERSONAL FACTORS: {Personal factors:25162} are also affecting patient's functional outcome.   REHAB POTENTIAL: {rehabpotential:25112}  CLINICAL DECISION MAKING: {clinical decision making:25114}  EVALUATION COMPLEXITY: {Evaluation complexity:25115}   GOALS: Goals reviewed with patient? {yes/no:20286}  SHORT TERM GOALS: Target date: ***  Patient will increase passive right shoulder flexion to 110 degrees Baseline: Goal status: INITIAL  2.  Patient will increase passive right shoulder external rotation to 30 degrees Baseline:  Goal status: INITIAL  3.  Patient will be independent with basic HEP following protocol suggestions Baseline:  Goal status: INITIAL   LONG TERM GOALS: Target date: 05/03/2022    Patient will reach overhead to cabinet with 2 pound weight without pain by the end of rehab Baseline:  Goal status: INITIAL  2.  Patient will reach behind her head to do her hair without pain Baseline:  Goal status: INITIAL  3.  Patient will reach behind her back without pain Baseline:  Goal status: INITIAL  PLAN:  PT FREQUENCY: 2x/week  PT DURATION: 8 weeks  PLANNED INTERVENTIONS: Therapeutic exercises, Therapeutic activity, Neuromuscular re-education, Patient/Family education, Self Care, Joint mobilization, Aquatic Therapy, Dry Needling, Cryotherapy, Moist heat, Ultrasound, Ionotophoresis 4mg /ml Dexamethasone, and Manual therapy  PLAN FOR NEXT SESSION: Begin with  passive range of motion per protocol.    Carney Living, PT 03/08/2022, 12:50 PM

## 2022-03-09 ENCOUNTER — Encounter (HOSPITAL_BASED_OUTPATIENT_CLINIC_OR_DEPARTMENT_OTHER): Payer: Self-pay | Admitting: Physical Therapy

## 2022-03-14 ENCOUNTER — Telehealth: Payer: Self-pay | Admitting: Orthopaedic Surgery

## 2022-03-14 NOTE — Telephone Encounter (Signed)
Returned call to pt confirming she was not in significant pain and RC red flags like popping sensation, excruciating pain, etc. Pt denied all symptoms other than just soreness. Took Ibuprofren this morning and has been icing it and is feeling better today. Pt is working with PT tomorrow and scheduled for f/u Thursday. No further questions asked

## 2022-03-14 NOTE — Telephone Encounter (Signed)
Pt called requesting a call back. Pt states she almost fell last night and kind of pulled shoulder alittle and want a call back from [place] or Dr Sammuel Hines to let them know what happened. She states she has an upcoming appt and just need reassurance her shoulde will be ok. She states she did not fall but she did break her fall. Please call pt at [phone].

## 2022-03-15 ENCOUNTER — Ambulatory Visit (HOSPITAL_BASED_OUTPATIENT_CLINIC_OR_DEPARTMENT_OTHER): Payer: BC Managed Care – PPO | Admitting: Physical Therapy

## 2022-03-15 ENCOUNTER — Encounter (HOSPITAL_BASED_OUTPATIENT_CLINIC_OR_DEPARTMENT_OTHER): Payer: Self-pay | Admitting: Physical Therapy

## 2022-03-15 DIAGNOSIS — M25521 Pain in right elbow: Secondary | ICD-10-CM

## 2022-03-15 DIAGNOSIS — M7581 Other shoulder lesions, right shoulder: Secondary | ICD-10-CM | POA: Diagnosis not present

## 2022-03-15 DIAGNOSIS — G8929 Other chronic pain: Secondary | ICD-10-CM

## 2022-03-15 DIAGNOSIS — M6281 Muscle weakness (generalized): Secondary | ICD-10-CM

## 2022-03-15 DIAGNOSIS — M25611 Stiffness of right shoulder, not elsewhere classified: Secondary | ICD-10-CM

## 2022-03-15 NOTE — Therapy (Signed)
OUTPATIENT PHYSICAL THERAPY SHOULDER EVALUATION   Patient Name: Destiny Gardner MRN: [id number] DOB:[birth year], 57 y.o., female Today's Date: 03/15/2022  END OF SESSION:  PT End of Session - 03/15/22 1119     Visit Number 2    Number of Visits 16    Date for PT Re-Evaluation 05/03/22    PT Start Time 1015    PT Stop Time 1056    PT Time Calculation (min) 41 min    Activity Tolerance Patient tolerated treatment well    Behavior During Therapy [facility] for tasks assessed/performed             Past Medical History:  Diagnosis Date   Complication of anesthesia    post-op N/V, waking up during rhinoplasty and colonoscopy   Endometriosis    Headache    Hx Occular Migraine   Inflammatory bowel disease    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,now with constipation 2010   PONV (postoperative nausea and vomiting)    Past Surgical History:  Procedure Laterality Date   COLONOSCOPY  05/05/2009   TCS/EGD :noraml stomach,colon,and duodenum   COLONOSCOPY  2020   ESOPHAGOGASTRODUODENOSCOPY  05/05/2009   with TCS   HERNIA REPAIR  2015   Umbilical Hernia   LAPAROTOMY  03/07/1997   endometriosis   MYRINGOTOMY     Tubes,Tympanoplasty   RHINOPLASTY  1989   SHOULDER ARTHROSCOPY WITH ROTATOR CUFF REPAIR AND OPEN BICEPS TENODESIS Right 03/03/2022   Procedure: RIGHT SHOULDER ARTHROSCOPY WITH ROTATOR CUFF REPAIR AND BICEPS TENODESIS;  Surgeon: Huel Cote, MD;  Location: [facility] OR;  Service: Orthopedics;  Laterality: Right;   Tonillectomy  1990   WISDOM TOOTH EXTRACTION     Patient Active Problem List   Diagnosis Date Noted   Tendinitis of right rotator cuff 03/03/2022   Biceps tendinitis of right shoulder 03/03/2022   Cough 05/18/2015   ABDOMINAL PAIN 05/26/2009   CHANGE IN BOWELS 01/27/2009   CONSTIPATION, CHRONIC 12/11/2008   ENDOMETRIOSIS 12/11/2008   IRRITABLE BOWEL SYNDROME, HX OF 12/11/2008    PCP: Onnie Graham FNP   REFERRING PROVIDER: Dr Huel Cote   REFERRING DIAG:   Diagnosis  M75.81 (ICD-10-CM) - Tendinitis of right rotator cuff  RTC repair   THERAPY DIAG:  Chronic right shoulder pain  Muscle weakness (generalized)  Stiffness of right shoulder, not elsewhere classified  Pain in right elbow  Rationale for Evaluation and Treatment: Rehabilitation  ONSET DATE: 03/03/2022  SUBJECTIVE:                                                                                                                                                                                      SUBJECTIVE STATEMENT:  The patient reports she slightly ost her balance and caught herself and had a sharp pain in her anterior shoulder.  PERTINENT HISTORY: Ocular Migranes  PAIN:  Are you having pain? Yes: NPRS scale: 3-4/10 prior to catch very little pain  Pain location: right anterior shoulder  Pain description: right shoulder pain  Aggravating factors: use of the shoulder/ sleeping  Relieving factors: rest, tylenol   PRECAUTIONS: Shoulder follow protocol   WEIGHT BEARING RESTRICTIONS: Yes NWB   FALLS:  Has patient fallen in last 6 months? No  LIVING ENVIRONMENT:  OCCUPATION: Stay at home mom.   Hobbies: work around [employer] doing house rehab, cooking.  PLOF: Independent  PATIENT GOALS:    NEXT MD VISIT:   OBJECTIVE:   DIAGNOSTIC FINDINGS:  Nothing post op PATIENT SURVEYS:  FOTO    COGNITION: Overall cognitive status: Within functional limits for tasks assessed     SENSATION: A little numbness in the hand but it has improved   POSTURE: Good.   UPPER EXTREMITY ROM:   Passive ROM Right eval Left eval  Shoulder flexion 75 No limit  Shoulder extension    Shoulder abduction    Shoulder adduction    Shoulder internal rotation 45 No limit      Shoulder external rotation 5 No limit   Elbow flexion    Elbow extension    Wrist flexion    Wrist extension    Wrist ulnar deviation    Wrist radial deviation    Wrist pronation    Wrist supination     (Blank rows = not tested)  UPPER EXTREMITY MMT:  MMT Right eval Left eval  Shoulder flexion    Shoulder extension    Shoulder abduction    Shoulder adduction    Shoulder internal rotation    Shoulder external rotation    Middle trapezius    Lower trapezius    Elbow flexion    Elbow extension    Wrist flexion    Wrist extension    Wrist ulnar deviation    Wrist radial deviation    Wrist pronation    Wrist supination    Grip strength (lbs)    (Blank rows = not tested) not tested at this time secondary to recent surgery    PALPATION:  No unexpected tenderness ot palpation    TODAY'S TREATMENT:                                                                                                                                         DATE:   03/15/2021 Manual: PROM into flexion and er per protocol limits; trigger point release to upper trap  Scpa retraction to neutral x10  Self Elbow flexion and extension without stretching extension x10    Eval: Manual: Gentle passive range of motion per protocol limits.  Reviewed pendulums patient reported minor pain.  Patient advised to try again  later or tomorrow. Reviewed wrist and grip strengthening options.  PATIENT EDUCATION: Education details: HEP, symptom management, progression of activity.  Importance of sling use. Person educated: Patient Education method: Explanation, Demonstration, Tactile cues, Verbal cues, and Handouts Education comprehension: verbalized understanding, returned demonstration, verbal cues required, tactile cues required, and needs further education  HOME EXERCISE PROGRAM: Access Code: [email or web address] URL: [email or web address] Date: 03/08/2022 Prepared by: Lorayne Bender  Exercises - Circular Shoulder Pendulum with Table Support  - 3 x daily - 7 x weekly - 1 sets - 10 reps - Wrist Extension AROM  - 1 x daily - 7 x weekly - 3 sets - 10 reps  ASSESSMENT:  CLINICAL IMPRESSION: The patient  had mild limitations in expected ER today. She had pain in her anterior shoulder at the beginning of the session but that improved. Therapy reviewed self passive flexion of the elbow and scap retraction for home to neutral. Her flexion range opened up well with manual therapy. Therapy will progress as tolerated. We will get her in again at the end of the week to work on range.   OBJECTIVE IMPAIRMENTS: decreased activity tolerance, decreased mobility, decreased ROM, decreased strength, impaired UE functional use, and pain.   ACTIVITY LIMITATIONS: carrying, lifting, sleeping, bathing, dressing, self feeding, reach over head, and hygiene/grooming  PARTICIPATION LIMITATIONS: meal prep, cleaning, laundry, community activity, occupation, and yard work  PERSONAL FACTORS: 1-2 comorbidities: ocular migranes  are also affecting patient's functional outcome.   REHAB POTENTIAL: Excellent  CLINICAL DECISION MAKING: Stable/uncomplicated  EVALUATION COMPLEXITY: Low   GOALS: Goals reviewed with patient? Yes  SHORT TERM GOALS: Target date: 04/06/2022    Patient will increase passive right shoulder flexion to 110 degrees Baseline: Goal status: INITIAL  2.  Patient will increase passive right shoulder external rotation to 30 degrees Baseline:  Goal status: INITIAL  3.  Patient will be independent with basic HEP following protocol suggestions Baseline:  Goal status: INITIAL   LONG TERM GOALS: Target date: 05/03/2022    Patient will reach overhead to cabinet with 2 pound weight without pain by the end of rehab Baseline:  Goal status: INITIAL  2.  Patient will reach behind her head to do her hair without pain Baseline:  Goal status: INITIAL  3.  Patient will reach behind her back without pain Baseline:  Goal status: INITIAL  PLAN:  PT FREQUENCY: 2x/week  PT DURATION: 8 weeks  PLANNED INTERVENTIONS: Therapeutic exercises, Therapeutic activity, Neuromuscular re-education,  Patient/Family education, Self Care, Joint mobilization, Aquatic Therapy, Dry Needling, Cryotherapy, Moist heat, Ultrasound, Ionotophoresis 4mg /ml Dexamethasone, and Manual therapy  PLAN FOR NEXT SESSION: Begin with passive range of motion per protocol.    , PT 03/15/2022, 11:21 AM

## 2022-03-17 ENCOUNTER — Ambulatory Visit (INDEPENDENT_AMBULATORY_CARE_PROVIDER_SITE_OTHER): Payer: BC Managed Care – PPO | Admitting: Orthopaedic Surgery

## 2022-03-17 ENCOUNTER — Other Ambulatory Visit (HOSPITAL_BASED_OUTPATIENT_CLINIC_OR_DEPARTMENT_OTHER): Payer: Self-pay

## 2022-03-17 DIAGNOSIS — M7581 Other shoulder lesions, right shoulder: Secondary | ICD-10-CM

## 2022-03-17 MED ORDER — MELOXICAM 15 MG PO TABS
15.0000 mg | ORAL_TABLET | Freq: Every day | ORAL | 0 refills | Status: AC
  Filled 2022-03-17: qty 7, 7d supply, fill #0

## 2022-03-17 MED ORDER — METHOCARBAMOL 500 MG PO TABS
500.0000 mg | ORAL_TABLET | Freq: Two times a day (BID) | ORAL | 0 refills | Status: AC
  Filled 2022-03-17: qty 14, 7d supply, fill #0

## 2022-03-17 NOTE — Progress Notes (Signed)
Post Operative Evaluation    Procedure/Date of Surgery: Right shoulder rotator cuff repair biceps tenodesis 03/03/22  Interval History:    Presents today 2 weeks status post the above procedure.  This time she has having some spasms in her shoulder.  This has been painful.  She has begun to work in physical therapy.  She is compliant with sling usage.   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,now with constipation 2010   PONV (postoperative nausea and vomiting)    Past Surgical History:  Procedure Laterality Date   COLONOSCOPY  05/05/2009   TCS/EGD :noraml stomach,colon,and duodenum   COLONOSCOPY  2020   ESOPHAGOGASTRODUODENOSCOPY  05/05/2009   with TCS   HERNIA REPAIR  9518   Umbilical Hernia   LAPAROTOMY  03/07/1997   endometriosis   MYRINGOTOMY     Tubes,Tympanoplasty   RHINOPLASTY  1989   SHOULDER ARTHROSCOPY WITH ROTATOR CUFF REPAIR AND OPEN BICEPS TENODESIS Right 03/03/2022   Procedure: RIGHT SHOULDER ARTHROSCOPY WITH ROTATOR CUFF REPAIR AND BICEPS TENODESIS;  Surgeon: Vanetta Mulders, MD;  Location: [facility];  Service: Orthopedics;  Laterality: Right;   Tonillectomy  1990   WISDOM TOOTH EXTRACTION     Social History   Socioeconomic History   Marital status: Married    Spouse name: Not on file   Number of children: Not on file   Years of education: Not on file   Highest education level: Not on file  Occupational History   Not on file  Tobacco Use   Smoking status: Never   Smokeless tobacco: Never  Substance and Sexual Activity   Alcohol use: Yes    Alcohol/week: 0.0 standard drinks of alcohol    Comment: social   Drug use: No   Sexual activity: Yes  Other  Topics Concern   Not on file  Social History Narrative   Not on file   Social Determinants of Health   Financial Resource Strain: Not on file  Food Insecurity: Not on file  Transportation Needs: Not on file  Physical Activity: Not on file  Stress: Not on file  Social Connections: Not on file   Family History  Problem Relation Age of Onset   COPD Mother        never smoker   Prostate cancer Father    Allergies  Allergen Reactions   Codeine Nausea And Vomiting   Ketorolac Tromethamine    Penicillins Hives   Chlorhexidine Rash   Current Outpatient Medications  Medication Sig Dispense Refill   meloxicam (MOBIC) 15 MG tablet Take 1 tablet (15 mg total) by mouth daily for 7 days. 7 tablet 0   methocarbamol (ROBAXIN) 500 MG tablet Take 1 tablet (500 mg total) by mouth in the morning and at bedtime for 7 days. 14 tablet 0   Ascorbic Acid (VITAMIN C PO) Take 1 tablet by mouth daily.     aspirin EC 325 MG tablet Take 1 tablet (325 mg total) by mouth daily. 30 tablet 0   oxyCODONE (OXY IR/ROXICODONE) 5 MG immediate release tablet Take  1 tablet (5 mg total) by mouth every 4 (four) hours as needed (severe pain). 10 tablet 0   No current facility-administered medications for this visit.   No results found.  Review of Systems:   A ROS was performed including pertinent positives and negatives as documented in the HPI.   Musculoskeletal Exam:      Right shoulder incisions are well-appearing without erythema or drainage.  In the supine position she is able to forward elevate to approximately 90 degrees with external rotation at the side 20 degrees.  Internal rotation deferred today.   Imaging:      I personally reviewed and interpreted the radiographs.   Assessment:   2 weeks status post right shoulder rotator cuff repair and biceps tenodesis overall doing well.  At this time I have prescribed her some additional Mobic and Robaxin for course of 1 week to help her with her pain.   I do believe she is having some muscle spasms which are truncating her ability to work on passive motion and would like her to be taking Robaxin in order to help with this.  I will plan to see her back in 4 weeks for reassessment  Plan :    -Return to clinic 4 weeks for reassessment      I personally saw and evaluated the patient, and participated in the management and treatment plan.  Vanetta Mulders, MD Attending Physician, Orthopedic Surgery  This document was dictated using Dragon voice recognition software. A reasonable attempt at proof reading has been made to minimize errors.

## 2022-03-18 ENCOUNTER — Encounter (HOSPITAL_BASED_OUTPATIENT_CLINIC_OR_DEPARTMENT_OTHER): Payer: Self-pay | Admitting: Physical Therapy

## 2022-03-18 ENCOUNTER — Ambulatory Visit (HOSPITAL_BASED_OUTPATIENT_CLINIC_OR_DEPARTMENT_OTHER): Payer: BC Managed Care – PPO | Admitting: Physical Therapy

## 2022-03-18 DIAGNOSIS — M25611 Stiffness of right shoulder, not elsewhere classified: Secondary | ICD-10-CM

## 2022-03-18 DIAGNOSIS — M7581 Other shoulder lesions, right shoulder: Secondary | ICD-10-CM | POA: Diagnosis not present

## 2022-03-18 DIAGNOSIS — M6281 Muscle weakness (generalized): Secondary | ICD-10-CM

## 2022-03-18 DIAGNOSIS — M25521 Pain in right elbow: Secondary | ICD-10-CM

## 2022-03-18 DIAGNOSIS — G8929 Other chronic pain: Secondary | ICD-10-CM

## 2022-03-18 NOTE — Therapy (Signed)
OUTPATIENT PHYSICAL THERAPY SHOULDER EVALUATION   Patient Name: Destiny Gardner MRN: [id number] DOB:Jul 10, 1965, 57 y.o., female Today's Date: 03/18/2022  END OF SESSION:  PT End of Session - 03/18/22 1435     Visit Number 3    Number of Visits 16    Date for PT Re-Evaluation 05/03/22    PT Start Time 4010    PT Stop Time 1423    PT Time Calculation (min) 38 min    Activity Tolerance Patient tolerated treatment well    Behavior During Therapy WFL for tasks assessed/performed             Past Medical History:  Diagnosis Date   Complication of anesthesia    post-op N/V, waking up during rhinoplasty and colonoscopy   Endometriosis    Headache    Hx Occular Migraine   Inflammatory bowel disease    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,now with constipation 2010   PONV (postoperative nausea and vomiting)    Past Surgical History:  Procedure Laterality Date   COLONOSCOPY  05/05/2009   TCS/EGD :noraml stomach,colon,and duodenum   COLONOSCOPY  2020   ESOPHAGOGASTRODUODENOSCOPY  05/05/2009   with TCS   HERNIA REPAIR  2725   Umbilical Hernia   LAPAROTOMY  03/07/1997   endometriosis   MYRINGOTOMY     Tubes,Tympanoplasty   RHINOPLASTY  1989   SHOULDER ARTHROSCOPY WITH ROTATOR CUFF REPAIR AND OPEN BICEPS TENODESIS Right 03/03/2022   Procedure: RIGHT SHOULDER ARTHROSCOPY WITH ROTATOR CUFF REPAIR AND BICEPS TENODESIS;  Surgeon: Vanetta Mulders, MD;  Location: [facility];  Service: Orthopedics;  Laterality: Right;   [place] EXTRACTION     Patient Active Problem List   Diagnosis Date Noted   Tendinitis of right rotator cuff 03/03/2022   Biceps tendinitis of right shoulder 03/03/2022   Cough 05/18/2015   ABDOMINAL PAIN 05/26/2009   CHANGE IN BOWELS 01/27/2009   CONSTIPATION, CHRONIC 12/11/2008   ENDOMETRIOSIS 12/11/2008   IRRITABLE BOWEL SYNDROME, HX OF 12/11/2008    PCP: Oliver Pila FNP   REFERRING PROVIDER: Dr Vanetta Mulders   REFERRING DIAG:   Diagnosis  M75.81 (ICD-10-CM) - Tendinitis of right rotator cuff  RTC repair   THERAPY DIAG:  Chronic right shoulder pain  Muscle weakness (generalized)  Stiffness of right shoulder, not elsewhere classified  Pain in right elbow  Rationale for Evaluation and Treatment: Rehabilitation  ONSET DATE: 03/03/2022  SUBJECTIVE:                                                                                                                                                                                      SUBJECTIVE STATEMENT:  The patient reports she slightly ost her balance and caught herself and had a sharp pain in her anterior shoulder.  PERTINENT HISTORY: Ocular Migranes  PAIN:  Are you having pain? Yes: NPRS scale: 3-4/10 prior to catch very little pain  Pain location: right anterior shoulder  Pain description: right shoulder pain  Aggravating factors: use of the shoulder/ sleeping  Relieving factors: rest, tylenol   PRECAUTIONS: Shoulder follow protocol   WEIGHT BEARING RESTRICTIONS: Yes NWB   FALLS:  Has patient fallen in last 6 months? No  LIVING ENVIRONMENT:  OCCUPATION: Stay at home mom.   Hobbies: work around [employer] doing house rehab, cooking.  PLOF: Independent  PATIENT GOALS:    NEXT MD VISIT:   OBJECTIVE:   DIAGNOSTIC FINDINGS:  Nothing post op PATIENT SURVEYS:  FOTO    COGNITION: Overall cognitive status: Within functional limits for tasks assessed     SENSATION: A little numbness in the hand but it has improved   POSTURE: Good.   UPPER EXTREMITY ROM:   Passive ROM Right eval Left eval  Shoulder flexion 75 No limit  Shoulder extension    Shoulder abduction    Shoulder adduction    Shoulder internal rotation 45 No limit      Shoulder external rotation 5 No limit   Elbow flexion    Elbow extension    Wrist flexion    Wrist extension    Wrist ulnar deviation    Wrist radial deviation    Wrist pronation    Wrist supination     (Blank rows = not tested)  UPPER EXTREMITY MMT:  MMT Right eval Left eval  Shoulder flexion    Shoulder extension    Shoulder abduction    Shoulder adduction    Shoulder internal rotation    Shoulder external rotation    Middle trapezius    Lower trapezius    Elbow flexion    Elbow extension    Wrist flexion    Wrist extension    Wrist ulnar deviation    Wrist radial deviation    Wrist pronation    Wrist supination    Grip strength (lbs)    (Blank rows = not tested) not tested at this time secondary to recent surgery    PALPATION:  No unexpected tenderness ot palpation    TODAY'S TREATMENT:                                                                                                                                         DATE:   03/15/2021 Manual: PROM into flexion and er per protocol limits; trigger point release to upper trap  Scpa retraction to neutral x10  Self Elbow flexion and extension without stretching extension x10    Eval: Manual: Gentle passive range of motion per protocol limits.  Reviewed pendulums patient reported minor pain.  Patient advised to try again  later or tomorrow. Reviewed wrist and grip strengthening options.  PATIENT EDUCATION: Education details: HEP, symptom management, progression of activity.  Importance of sling use. Person educated: Patient Education method: Explanation, Demonstration, Tactile cues, Verbal cues, and Handouts Education comprehension: verbalized understanding, returned demonstration, verbal cues required, tactile cues required, and needs further education  HOME EXERCISE PROGRAM: Access Code: [email or web address] URL: [email or web address] Date: 03/08/2022 Prepared by: Carolyne Littles  Exercises - Reviewed pendulums - PROM into ER and flexion   ASSESSMENT:  CLINICAL IMPRESSION: The patient had a little more pain with flexion today but a little better movement with ER. She was encouraged to continue  with her pendulums athome. We will see her 2x again next week to continue to work on PROM. At this time she is PROM per protocol.  OBJECTIVE IMPAIRMENTS: decreased activity tolerance, decreased mobility, decreased ROM, decreased strength, impaired UE functional use, and pain.   ACTIVITY LIMITATIONS: carrying, lifting, sleeping, bathing, dressing, self feeding, reach over head, and hygiene/grooming  PARTICIPATION LIMITATIONS: meal prep, cleaning, laundry, community activity, occupation, and yard work  PERSONAL FACTORS: 1-2 comorbidities: ocular migranes  are also affecting patient's functional outcome.   REHAB POTENTIAL: Excellent  CLINICAL DECISION MAKING: Stable/uncomplicated  EVALUATION COMPLEXITY: Low   GOALS: Goals reviewed with patient? Yes  SHORT TERM GOALS: Target date: 04/06/2022    Patient will increase passive right shoulder flexion to 110 degrees Baseline: Goal status: INITIAL  2.  Patient will increase passive right shoulder external rotation to 30 degrees Baseline:  Goal status: INITIAL  3.  Patient will be independent with basic HEP following protocol suggestions Baseline:  Goal status: INITIAL   LONG TERM GOALS: Target date: 05/03/2022    Patient will reach overhead to cabinet with 2 pound weight without pain by the end of rehab Baseline:  Goal status: INITIAL  2.  Patient will reach behind her head to do her hair without pain Baseline:  Goal status: INITIAL  3.  Patient will reach behind her back without pain Baseline:  Goal status: INITIAL  PLAN:  PT FREQUENCY: 2x/week  PT DURATION: 8 weeks  PLANNED INTERVENTIONS: Therapeutic exercises, Therapeutic activity, Neuromuscular re-education, Patient/Family education, Self Care, Joint mobilization, Aquatic Therapy, Dry Needling, Cryotherapy, Moist heat, Ultrasound, Ionotophoresis 4mg /ml Dexamethasone, and Manual therapy  PLAN FOR NEXT SESSION: Begin with passive range of motion per protocol.     Carney Living, PT 03/18/2022, 2:50 PM

## 2022-03-21 ENCOUNTER — Encounter (HOSPITAL_BASED_OUTPATIENT_CLINIC_OR_DEPARTMENT_OTHER): Payer: Self-pay | Admitting: Physical Therapy

## 2022-03-22 ENCOUNTER — Encounter (HOSPITAL_BASED_OUTPATIENT_CLINIC_OR_DEPARTMENT_OTHER): Payer: Self-pay | Admitting: Physical Therapy

## 2022-03-22 ENCOUNTER — Ambulatory Visit (HOSPITAL_BASED_OUTPATIENT_CLINIC_OR_DEPARTMENT_OTHER): Payer: BC Managed Care – PPO | Admitting: Physical Therapy

## 2022-03-22 DIAGNOSIS — M7581 Other shoulder lesions, right shoulder: Secondary | ICD-10-CM | POA: Diagnosis not present

## 2022-03-22 DIAGNOSIS — M6281 Muscle weakness (generalized): Secondary | ICD-10-CM

## 2022-03-22 DIAGNOSIS — G8929 Other chronic pain: Secondary | ICD-10-CM

## 2022-03-22 DIAGNOSIS — M25521 Pain in right elbow: Secondary | ICD-10-CM

## 2022-03-22 DIAGNOSIS — M25611 Stiffness of right shoulder, not elsewhere classified: Secondary | ICD-10-CM

## 2022-03-22 NOTE — Therapy (Signed)
OUTPATIENT PHYSICAL THERAPY SHOULDER EVALUATION   Patient Name: Destiny Gardner MRN: [id number] DOB:27-Apr-1965, 57 y.o., female Today's Date: 03/18/2022  END OF SESSION:  PT End of Session - 03/18/22 1435     Visit Number 3    Number of Visits 16    Date for PT Re-Evaluation 05/03/22    PT Start Time 6440    PT Stop Time 1423    PT Time Calculation (min) 38 min    Activity Tolerance Patient tolerated treatment well    Behavior During Therapy WFL for tasks assessed/performed             Past Medical History:  Diagnosis Date   Complication of anesthesia    post-op N/V, waking up during rhinoplasty and colonoscopy   Endometriosis    Headache    Hx Occular Migraine   Inflammatory bowel disease    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,now with constipation 2010   PONV (postoperative nausea and vomiting)    Past Surgical History:  Procedure Laterality Date   COLONOSCOPY  05/05/2009   TCS/EGD :noraml stomach,colon,and duodenum   COLONOSCOPY  2020   ESOPHAGOGASTRODUODENOSCOPY  05/05/2009   with TCS   HERNIA REPAIR  3474   Umbilical Hernia   LAPAROTOMY  03/07/1997   endometriosis   MYRINGOTOMY     Tubes,Tympanoplasty   RHINOPLASTY  1989   SHOULDER ARTHROSCOPY WITH ROTATOR CUFF REPAIR AND OPEN BICEPS TENODESIS Right 03/03/2022   Procedure: RIGHT SHOULDER ARTHROSCOPY WITH ROTATOR CUFF REPAIR AND BICEPS TENODESIS;  Surgeon: Vanetta Mulders, MD;  Location: [place];  Service: Orthopedics;  Laterality: Right;   [place] EXTRACTION     Patient Active Problem List   Diagnosis Date Noted   Tendinitis of right rotator cuff 03/03/2022   Biceps tendinitis of right shoulder 03/03/2022   Cough 05/18/2015   ABDOMINAL PAIN 05/26/2009   CHANGE IN BOWELS 01/27/2009   CONSTIPATION, CHRONIC 12/11/2008   ENDOMETRIOSIS 12/11/2008   IRRITABLE BOWEL SYNDROME, HX OF 12/11/2008    PCP: Oliver Pila FNP   REFERRING PROVIDER: Dr Vanetta Mulders   REFERRING DIAG:   Diagnosis  M75.81 (ICD-10-CM) - Tendinitis of right rotator cuff  RTC repair   THERAPY DIAG:  Chronic right shoulder pain  Muscle weakness (generalized)  Stiffness of right shoulder, not elsewhere classified  Pain in right elbow  Rationale for Evaluation and Treatment: Rehabilitation  ONSET DATE: 03/03/2022  SUBJECTIVE:                                                                                                                                                                                      SUBJECTIVE STATEMENT:  The patient reports she slightly ost her balance and caught herself and had a sharp pain in her anterior shoulder.  PERTINENT HISTORY: Ocular Migranes  PAIN:  Are you having pain? Yes: NPRS scale: 3-4/10 prior to catch very little pain  Pain location: right anterior shoulder  Pain description: right shoulder pain  Aggravating factors: use of the shoulder/ sleeping  Relieving factors: rest, tylenol   PRECAUTIONS: Shoulder follow protocol   WEIGHT BEARING RESTRICTIONS: Yes NWB   FALLS:  Has patient fallen in last 6 months? No  LIVING ENVIRONMENT:  OCCUPATION: Stay at home mom.   Hobbies: work around [employer] doing house rehab, cooking.  PLOF: Independent  PATIENT GOALS:    NEXT MD VISIT:   OBJECTIVE:   DIAGNOSTIC FINDINGS:  Nothing post op PATIENT SURVEYS:  FOTO    COGNITION: Overall cognitive status: Within functional limits for tasks assessed     SENSATION: A little numbness in the hand but it has improved   POSTURE: Good.   UPPER EXTREMITY ROM:   Passive ROM Right eval Left eval  Shoulder flexion 75 No limit  Shoulder extension    Shoulder abduction    Shoulder adduction    Shoulder internal rotation 45 No limit      Shoulder external rotation 5 No limit   Elbow flexion    Elbow extension    Wrist flexion    Wrist extension    Wrist ulnar deviation    Wrist radial deviation    Wrist pronation    Wrist supination     (Blank rows = not tested)  UPPER EXTREMITY MMT:  MMT Right eval Left eval  Shoulder flexion    Shoulder extension    Shoulder abduction    Shoulder adduction    Shoulder internal rotation    Shoulder external rotation    Middle trapezius    Lower trapezius    Elbow flexion    Elbow extension    Wrist flexion    Wrist extension    Wrist ulnar deviation    Wrist radial deviation    Wrist pronation    Wrist supination    Grip strength (lbs)    (Blank rows = not tested) not tested at this time secondary to recent surgery    PALPATION:  No unexpected tenderness ot palpation    TODAY'S TREATMENT:                                                                                                                                         DATE:  03/22/2022  03/15/2021 Manual: PROM into flexion and er per protocol limits; trigger point release to upper trap  Scpa retraction to neutral x10  Self Elbow flexion and extension without stretching extension x10    Eval: Manual: Gentle passive range of motion per protocol limits.  Reviewed pendulums patient reported minor pain.  Patient advised to try  again later or tomorrow. Reviewed wrist and grip strengthening options.  PATIENT EDUCATION: Education details: HEP, symptom management, progression of activity.  Importance of sling use. Person educated: Patient Education method: Explanation, Demonstration, Tactile cues, Verbal cues, and Handouts Education comprehension: verbalized understanding, returned demonstration, verbal cues required, tactile cues required, and needs further education  HOME EXERCISE PROGRAM: Access Code: [email or web address] URL: [email or web address] Date: 03/08/2022 Prepared by: Lorayne Bender  Exercises - Reviewed pendulums - PROM into ER and flexion   ASSESSMENT:  CLINICAL IMPRESSION:   The patients ER and flexion both improved today. She had very minimal pain. We reviewed the exercises to  continue at home. .  OBJECTIVE IMPAIRMENTS: decreased activity tolerance, decreased mobility, decreased ROM, decreased strength, impaired UE functional use, and pain.   ACTIVITY LIMITATIONS: carrying, lifting, sleeping, bathing, dressing, self feeding, reach over head, and hygiene/grooming  PARTICIPATION LIMITATIONS: meal prep, cleaning, laundry, community activity, occupation, and yard work  PERSONAL FACTORS:none    REHAB POTENTIAL: Excellent  CLINICAL DECISION MAKING: Stable/uncomplicated  EVALUATION COMPLEXITY: Low   GOALS: Goals reviewed with patient? Yes  SHORT TERM GOALS: Target date: 04/06/2022    Patient will increase passive right shoulder flexion to 110 degrees Baseline: Goal status: INITIAL  2.  Patient will increase passive right shoulder external rotation to 30 degrees Baseline:  Goal status: INITIAL  3.  Patient will be independent with basic HEP following protocol suggestions Baseline:  Goal status: INITIAL   LONG TERM GOALS: Target date: 05/03/2022    Patient will reach overhead to cabinet with 2 pound weight without pain by the end of rehab Baseline:  Goal status: INITIAL  2.  Patient will reach behind her head to do her hair without pain Baseline:  Goal status: INITIAL  3.  Patient will reach behind her back without pain Baseline:  Goal status: INITIAL  PLAN:  PT FREQUENCY: 2x/week  PT DURATION: 8 weeks  PLANNED INTERVENTIONS: Therapeutic exercises, Therapeutic activity, Neuromuscular re-education, Patient/Family education, Self Care, Joint mobilization, Aquatic Therapy, Dry Needling, Cryotherapy, Moist heat, Ultrasound, Ionotophoresis 4mg /ml Dexamethasone, and Manual therapy  PLAN FOR NEXT SESSION: Begin with passive range of motion per protocol.    , PT 03/18/2022, 2:50 PM

## 2022-03-25 ENCOUNTER — Ambulatory Visit (HOSPITAL_BASED_OUTPATIENT_CLINIC_OR_DEPARTMENT_OTHER): Payer: BC Managed Care – PPO | Admitting: Physical Therapy

## 2022-03-25 DIAGNOSIS — G8929 Other chronic pain: Secondary | ICD-10-CM

## 2022-03-25 DIAGNOSIS — M25611 Stiffness of right shoulder, not elsewhere classified: Secondary | ICD-10-CM

## 2022-03-25 DIAGNOSIS — M7581 Other shoulder lesions, right shoulder: Secondary | ICD-10-CM | POA: Diagnosis not present

## 2022-03-25 DIAGNOSIS — M6281 Muscle weakness (generalized): Secondary | ICD-10-CM

## 2022-03-25 DIAGNOSIS — M25521 Pain in right elbow: Secondary | ICD-10-CM

## 2022-03-27 NOTE — Therapy (Signed)
OUTPATIENT PHYSICAL THERAPY SHOULDER EVALUATION   Patient Name: Destiny Gardner MRN: [id number] DOB:Jul 10, 1965, 57 y.o., female Today's Date: 03/18/2022  END OF SESSION:  PT End of Session - 03/18/22 1435     Visit Number 3    Number of Visits 16    Date for PT Re-Evaluation 05/03/22    PT Start Time 4010    PT Stop Time 1423    PT Time Calculation (min) 38 min    Activity Tolerance Patient tolerated treatment well    Behavior During Therapy WFL for tasks assessed/performed             Past Medical History:  Diagnosis Date   Complication of anesthesia    post-op N/V, waking up during rhinoplasty and colonoscopy   Endometriosis    Headache    Hx Occular Migraine   Inflammatory bowel disease    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,now with constipation 2010   PONV (postoperative nausea and vomiting)    Past Surgical History:  Procedure Laterality Date   COLONOSCOPY  05/05/2009   TCS/EGD :noraml stomach,colon,and duodenum   COLONOSCOPY  2020   ESOPHAGOGASTRODUODENOSCOPY  05/05/2009   with TCS   HERNIA REPAIR  2725   Umbilical Hernia   LAPAROTOMY  03/07/1997   endometriosis   MYRINGOTOMY     Tubes,Tympanoplasty   RHINOPLASTY  1989   SHOULDER ARTHROSCOPY WITH ROTATOR CUFF REPAIR AND OPEN BICEPS TENODESIS Right 03/03/2022   Procedure: RIGHT SHOULDER ARTHROSCOPY WITH ROTATOR CUFF REPAIR AND BICEPS TENODESIS;  Surgeon: Vanetta Mulders, MD;  Location: [place];  Service: Orthopedics;  Laterality: Right;   [place] EXTRACTION     Patient Active Problem List   Diagnosis Date Noted   Tendinitis of right rotator cuff 03/03/2022   Biceps tendinitis of right shoulder 03/03/2022   Cough 05/18/2015   ABDOMINAL PAIN 05/26/2009   CHANGE IN BOWELS 01/27/2009   CONSTIPATION, CHRONIC 12/11/2008   ENDOMETRIOSIS 12/11/2008   IRRITABLE BOWEL SYNDROME, HX OF 12/11/2008    PCP: Oliver Pila FNP   REFERRING PROVIDER: Dr Vanetta Mulders   REFERRING DIAG:   Diagnosis  M75.81 (ICD-10-CM) - Tendinitis of right rotator cuff  RTC repair   THERAPY DIAG:  Chronic right shoulder pain  Muscle weakness (generalized)  Stiffness of right shoulder, not elsewhere classified  Pain in right elbow  Rationale for Evaluation and Treatment: Rehabilitation  ONSET DATE: 03/03/2022  SUBJECTIVE:                                                                                                                                                                                      SUBJECTIVE STATEMENT:  The patient reports that last night she made a quick movement in her sling and had increased pain. PERTINENT HISTORY: Ocular Migranes  PAIN:  Are you having pain? Yes: NPRS scale: 3-4/10 prior to catch very little pain  Pain location: right anterior shoulder  Pain description: right shoulder pain  Aggravating factors: use of the shoulder/ sleeping  Relieving factors: rest, tylenol   PRECAUTIONS: Shoulder follow protocol   WEIGHT BEARING RESTRICTIONS: Yes NWB   FALLS:  Has patient fallen in last 6 months? No  LIVING ENVIRONMENT:  OCCUPATION: Stay at home mom.   Hobbies: work around [employer] doing house rehab, cooking.  PLOF: Independent  PATIENT GOALS:    NEXT MD VISIT:   OBJECTIVE:   DIAGNOSTIC FINDINGS:  Nothing post op PATIENT SURVEYS:  FOTO    COGNITION: Overall cognitive status: Within functional limits for tasks assessed     SENSATION: A little numbness in the hand but it has improved   POSTURE: Good.   UPPER EXTREMITY ROM:   Passive ROM Right eval Left eval  Shoulder flexion 75 No limit  Shoulder extension    Shoulder abduction    Shoulder adduction    Shoulder internal rotation 45 No limit      Shoulder external rotation 5 No limit   Elbow flexion    Elbow extension    Wrist flexion    Wrist extension    Wrist ulnar deviation    Wrist radial deviation    Wrist pronation    Wrist supination    (Blank rows  = not tested)  UPPER EXTREMITY MMT:  MMT Right eval Left eval  Shoulder flexion    Shoulder extension    Shoulder abduction    Shoulder adduction    Shoulder internal rotation    Shoulder external rotation    Middle trapezius    Lower trapezius    Elbow flexion    Elbow extension    Wrist flexion    Wrist extension    Wrist ulnar deviation    Wrist radial deviation    Wrist pronation    Wrist supination    Grip strength (lbs)    (Blank rows = not tested) not tested at this time secondary to recent surgery    PALPATION:  No unexpected tenderness ot palpation    TODAY'S TREATMENT:                                                                                                                                         DATE:  03/25/2022 Manual: PROM into flexion and er per protocol limits; trigger point release to upper trap; grade I and II inferior and posterior glides.   Scpa retraction to neutral x10  Self Elbow flexion and extension without stretching extension x10    03/15/2021 Manual: PROM into flexion and er per protocol limits; trigger point release to upper trap  Scpa  retraction to neutral x10  Self Elbow flexion and extension without stretching extension x10    Eval: Manual: Gentle passive range of motion per protocol limits.  Reviewed pendulums patient reported minor pain.  Patient advised to try again later or tomorrow. Reviewed wrist and grip strengthening options.  PATIENT EDUCATION: Education details: HEP, symptom management, progression of activity.  Importance of sling use. Person educated: Patient Education method: Explanation, Demonstration, Tactile cues, Verbal cues, and Handouts Education comprehension: verbalized understanding, returned demonstration, verbal cues required, tactile cues required, and needs further education  HOME EXERCISE PROGRAM: Access Code: [email or web address] URL: [email or web address] Date: 03/08/2022 Prepared by:  Carolyne Littles  Exercises - Reviewed pendulums - PROM into ER and flexion   ASSESSMENT:  CLINICAL IMPRESSION:  The patient came in today with limitations in ER but had range of motion equal to her last visit after manual therapy.  We will continue to maintain current range of motion and hopefully gaining range of motion until the time she can begin active assistive range of motion on her own.  Her flexion is progressing as would be tolerated.  She continues to have pain with quick movements, but she has been compliant with sling use.  OBJECTIVE IMPAIRMENTS: decreased activity tolerance, decreased mobility, decreased ROM, decreased strength, impaired UE functional use, and pain.   ACTIVITY LIMITATIONS: carrying, lifting, sleeping, bathing, dressing, self feeding, reach over head, and hygiene/grooming  PARTICIPATION LIMITATIONS: meal prep, cleaning, laundry, community activity, occupation, and yard work  PERSONAL FACTORS:none    REHAB POTENTIAL: Excellent  CLINICAL DECISION MAKING: Stable/uncomplicated  EVALUATION COMPLEXITY: Low   GOALS: Goals reviewed with patient? Yes  SHORT TERM GOALS: Target date: 04/06/2022    Patient will increase passive right shoulder flexion to 110 degrees Baseline: Goal status: INITIAL  2.  Patient will increase passive right shoulder external rotation to 30 degrees Baseline:  Goal status: INITIAL  3.  Patient will be independent with basic HEP following protocol suggestions Baseline:  Goal status: INITIAL   LONG TERM GOALS: Target date: 05/03/2022    Patient will reach overhead to cabinet with 2 pound weight without pain by the end of rehab Baseline:  Goal status: INITIAL  2.  Patient will reach behind her head to do her hair without pain Baseline:  Goal status: INITIAL  3.  Patient will reach behind her back without pain Baseline:  Goal status: INITIAL  PLAN:  PT FREQUENCY: 2x/week  PT DURATION: 8 weeks  PLANNED  INTERVENTIONS: Therapeutic exercises, Therapeutic activity, Neuromuscular re-education, Patient/Family education, Self Care, Joint mobilization, Aquatic Therapy, Dry Needling, Cryotherapy, Moist heat, Ultrasound, Ionotophoresis 4mg /ml Dexamethasone, and Manual therapy  PLAN FOR NEXT SESSION: Begin with passive range of motion per protocol.    Carney Living, PT 03/18/2022, 2:50 PM

## 2022-03-29 ENCOUNTER — Encounter (HOSPITAL_BASED_OUTPATIENT_CLINIC_OR_DEPARTMENT_OTHER): Payer: Self-pay | Admitting: Physical Therapy

## 2022-03-29 ENCOUNTER — Ambulatory Visit (HOSPITAL_BASED_OUTPATIENT_CLINIC_OR_DEPARTMENT_OTHER): Payer: BC Managed Care – PPO | Admitting: Physical Therapy

## 2022-03-29 DIAGNOSIS — M25521 Pain in right elbow: Secondary | ICD-10-CM

## 2022-03-29 DIAGNOSIS — G8929 Other chronic pain: Secondary | ICD-10-CM

## 2022-03-29 DIAGNOSIS — M7581 Other shoulder lesions, right shoulder: Secondary | ICD-10-CM | POA: Diagnosis not present

## 2022-03-29 DIAGNOSIS — M6281 Muscle weakness (generalized): Secondary | ICD-10-CM

## 2022-03-29 DIAGNOSIS — M25611 Stiffness of right shoulder, not elsewhere classified: Secondary | ICD-10-CM

## 2022-03-29 NOTE — Therapy (Signed)
OUTPATIENT PHYSICAL THERAPY SHOULDER EVALUATION   Patient Name: Destiny Gardner MRN: [id number] DOB:18-Jun-1965, 57 y.o., female Today's Date: 03/29/2022  END OF SESSION:  PT End of Session - 03/29/22 1016     Visit Number 6    Number of Visits 16    Date for PT Re-Evaluation 05/03/22    PT Start Time 0865    PT Stop Time 1053    PT Time Calculation (min) 38 min    Activity Tolerance Patient tolerated treatment well    Behavior During Therapy WFL for tasks assessed/performed             Past Medical History:  Diagnosis Date   Complication of anesthesia    post-op N/V, waking up during rhinoplasty and colonoscopy   Endometriosis    Headache    Hx Occular Migraine   Inflammatory bowel disease    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,now with constipation 2010   PONV (postoperative nausea and vomiting)    Past Surgical History:  Procedure Laterality Date   COLONOSCOPY  05/05/2009   TCS/EGD :noraml stomach,colon,and duodenum   COLONOSCOPY  2020   ESOPHAGOGASTRODUODENOSCOPY  05/05/2009   with TCS   HERNIA REPAIR  7846   Umbilical Hernia   LAPAROTOMY  03/07/1997   endometriosis   MYRINGOTOMY     Tubes,Tympanoplasty   RHINOPLASTY  1989   SHOULDER ARTHROSCOPY WITH ROTATOR CUFF REPAIR AND OPEN BICEPS TENODESIS Right 03/03/2022   Procedure: RIGHT SHOULDER ARTHROSCOPY WITH ROTATOR CUFF REPAIR AND BICEPS TENODESIS;  Surgeon: Vanetta Mulders, MD;  Location: [place];  Service: Orthopedics;  Laterality: Right;   [place] EXTRACTION     Patient Active Problem List   Diagnosis Date Noted   Tendinitis of right rotator cuff 03/03/2022   Biceps tendinitis of right shoulder 03/03/2022   Cough 05/18/2015   ABDOMINAL PAIN 05/26/2009   CHANGE IN BOWELS 01/27/2009   CONSTIPATION, CHRONIC 12/11/2008   ENDOMETRIOSIS 12/11/2008   IRRITABLE BOWEL SYNDROME, HX OF 12/11/2008    PCP: Oliver Pila FNP   REFERRING PROVIDER: Dr Vanetta Mulders   REFERRING DIAG:   Diagnosis  M75.81 (ICD-10-CM) - Tendinitis of right rotator cuff  RTC repair   THERAPY DIAG:  Chronic right shoulder pain  Muscle weakness (generalized)  Stiffness of right shoulder, not elsewhere classified  Pain in right elbow  Rationale for Evaluation and Treatment: Rehabilitation  ONSET DATE: 03/03/2022  Days since surgery: 26   SUBJECTIVE:  SUBJECTIVE STATEMENT: Pt states that she has had no issues with the shoulder. She will have anterior shoulder discomfort at night with shoulder positioning.  PERTINENT HISTORY: Ocular Migranes  PAIN:  Are you having pain? Yes: NPRS scale: 3-4/10 prior to catch very little pain  Pain location: right anterior shoulder  Pain description: right shoulder pain  Aggravating factors: use of the shoulder/ sleeping  Relieving factors: rest, tylenol   PRECAUTIONS: Shoulder follow protocol   WEIGHT BEARING RESTRICTIONS: Yes NWB   FALLS:  Has patient fallen in last 6 months? No  LIVING ENVIRONMENT:  OCCUPATION: Stay at home mom.   Hobbies: work around [employer] doing house rehab, cooking.  PLOF: Independent  PATIENT GOALS:    NEXT MD VISIT:   OBJECTIVE:   DIAGNOSTIC FINDINGS:  Nothing post op PATIENT SURVEYS:  FOTO    COGNITION: Overall cognitive status: Within functional limits for tasks assessed     SENSATION: A little numbness in the hand but it has improved   POSTURE: Good.   UPPER EXTREMITY ROM:   Passive ROM Right eval Left eval  Shoulder flexion 75 No limit  Shoulder extension    Shoulder abduction    Shoulder adduction    Shoulder internal rotation 45 No limit      Shoulder external rotation 5 No limit   Elbow flexion    Elbow extension    Wrist flexion    Wrist extension    Wrist ulnar deviation    Wrist radial  deviation    Wrist pronation    Wrist supination    (Blank rows = not tested)  UPPER EXTREMITY MMT:  MMT Right eval Left eval  Shoulder flexion    Shoulder extension    Shoulder abduction    Shoulder adduction    Shoulder internal rotation    Shoulder external rotation    Middle trapezius    Lower trapezius    Elbow flexion    Elbow extension    Wrist flexion    Wrist extension    Wrist ulnar deviation    Wrist radial deviation    Wrist pronation    Wrist supination    Grip strength (lbs)    (Blank rows = not tested) not tested at this time secondary to recent surgery    PALPATION:  No unexpected tenderness ot palpation    TODAY'S TREATMENT:                                                                                                                                         DATE:   03/25/2022 Manual: PROM into flexion and er per protocol limits; LAD with oscillation during PROM; grade I and II inferior and posterior glides.   Flexion 100; ABD 75; ER 10; IR belly  Scpa retraction, rolls AP and PAx10  Neck stretching Sling adjustment Self Elbow flexion and extension without stretching extension x10  03/25/2022 Manual: PROM into flexion and er per protocol limits; trigger point release to upper trap; grade I and II inferior and posterior glides.   Scpa retraction to neutral x10  Self Elbow flexion and extension without stretching extension x10    03/15/2021 Manual: PROM into flexion and er per protocol limits; trigger point release to upper trap  Scpa retraction to neutral x10  Self Elbow flexion and extension without stretching extension x10    Eval: Manual: Gentle passive range of motion per protocol limits.  Reviewed pendulums patient reported minor pain.  Patient advised to try again later or tomorrow. Reviewed wrist and grip strengthening options.  PATIENT EDUCATION: Education details: HEP, symptom management, progression of activity.  Importance of  sling use. Person educated: Patient Education method: Explanation, Demonstration, Tactile cues, Verbal cues, and Handouts Education comprehension: verbalized understanding, returned demonstration, verbal cues required, tactile cues required, and needs further education  HOME EXERCISE PROGRAM: Access Code: [email or web address] URL: [email or web address] Date: 03/08/2022 Prepared by: Carolyne Littles  Exercises - Reviewed pendulums - PROM into ER and flexion   ASSESSMENT:  CLINICAL IMPRESSION:  Pt able to continue with PROM but is guarded with flexion and ER ROM. ABD appears to be progression but flexion and ER continue to be tight. Pt likely to progress much more quickly when able to start AAROM and relax R UE. Plan to continue per protocol. Pt would benefit from continued skilled therapy in order to reach goals and maximize functional R UE strength and ROM for full return to PLOF.   OBJECTIVE IMPAIRMENTS: decreased activity tolerance, decreased mobility, decreased ROM, decreased strength, impaired UE functional use, and pain.   ACTIVITY LIMITATIONS: carrying, lifting, sleeping, bathing, dressing, self feeding, reach over head, and hygiene/grooming  PARTICIPATION LIMITATIONS: meal prep, cleaning, laundry, community activity, occupation, and yard work  PERSONAL FACTORS:none    REHAB POTENTIAL: Excellent  CLINICAL DECISION MAKING: Stable/uncomplicated  EVALUATION COMPLEXITY: Low   GOALS: Goals reviewed with patient? Yes  SHORT TERM GOALS: Target date: 04/06/2022    Patient will increase passive right shoulder flexion to 110 degrees Baseline: Goal status: INITIAL  2.  Patient will increase passive right shoulder external rotation to 30 degrees Baseline:  Goal status: INITIAL  3.  Patient will be independent with basic HEP following protocol suggestions Baseline:  Goal status: INITIAL   LONG TERM GOALS: Target date: 05/03/2022    Patient will reach overhead to  cabinet with 2 pound weight without pain by the end of rehab Baseline:  Goal status: INITIAL  2.  Patient will reach behind her head to do her hair without pain Baseline:  Goal status: INITIAL  3.  Patient will reach behind her back without pain Baseline:  Goal status: INITIAL  PLAN:  PT FREQUENCY: 2x/week  PT DURATION: 8 weeks  PLANNED INTERVENTIONS: Therapeutic exercises, Therapeutic activity, Neuromuscular re-education, Patient/Family education, Self Care, Joint mobilization, Aquatic Therapy, Dry Needling, Cryotherapy, Moist heat, Ultrasound, Ionotophoresis 4mg /ml Dexamethasone, and Manual therapy  PLAN FOR NEXT SESSION: Begin with passive range of motion per protocol.    Daleen Bo, PT 03/29/2022, 11:04 AM

## 2022-04-01 ENCOUNTER — Encounter (HOSPITAL_BASED_OUTPATIENT_CLINIC_OR_DEPARTMENT_OTHER): Payer: Self-pay | Admitting: Physical Therapy

## 2022-04-01 ENCOUNTER — Ambulatory Visit (HOSPITAL_BASED_OUTPATIENT_CLINIC_OR_DEPARTMENT_OTHER): Payer: BC Managed Care – PPO | Admitting: Physical Therapy

## 2022-04-01 DIAGNOSIS — M25611 Stiffness of right shoulder, not elsewhere classified: Secondary | ICD-10-CM

## 2022-04-01 DIAGNOSIS — M6281 Muscle weakness (generalized): Secondary | ICD-10-CM

## 2022-04-01 DIAGNOSIS — M7581 Other shoulder lesions, right shoulder: Secondary | ICD-10-CM | POA: Diagnosis not present

## 2022-04-01 DIAGNOSIS — G8929 Other chronic pain: Secondary | ICD-10-CM

## 2022-04-01 DIAGNOSIS — M25521 Pain in right elbow: Secondary | ICD-10-CM

## 2022-04-01 NOTE — Therapy (Signed)
OUTPATIENT PHYSICAL THERAPY SHOULDER EVALUATION   Patient Name: Destiny Gardner MRN: [id number] DOB:April 30, 1965, 57 y.o., female Today's Date: 04/01/2022  END OF SESSION:  PT End of Session - 04/01/22 1244     Visit Number 7    Number of Visits 16    Date for PT Re-Evaluation 05/03/22    PT Start Time 1145    PT Stop Time 1227    PT Time Calculation (min) 42 min    Activity Tolerance Patient tolerated treatment well    Behavior During Therapy [facility] for tasks assessed/performed              Past Medical History:  Diagnosis Date   Complication of anesthesia    post-op N/V, waking up during rhinoplasty and colonoscopy   Endometriosis    Headache    Hx Occular Migraine   Inflammatory bowel disease    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,now with constipation 2010   PONV (postoperative nausea and vomiting)    Past Surgical History:  Procedure Laterality Date   COLONOSCOPY  05/05/2009   TCS/EGD :noraml stomach,colon,and duodenum   COLONOSCOPY  2020   ESOPHAGOGASTRODUODENOSCOPY  05/05/2009   with TCS   HERNIA REPAIR  2015   Umbilical Hernia   LAPAROTOMY  03/07/1997   endometriosis   MYRINGOTOMY     Tubes,Tympanoplasty   RHINOPLASTY  1989   SHOULDER ARTHROSCOPY WITH ROTATOR CUFF REPAIR AND OPEN BICEPS TENODESIS Right 03/03/2022   Procedure: RIGHT SHOULDER ARTHROSCOPY WITH ROTATOR CUFF REPAIR AND BICEPS TENODESIS;  Surgeon: Huel Cote, MD;  Location: [facility] OR;  Service: Orthopedics;  Laterality: Right;   Tonillectomy  1990   WISDOM TOOTH EXTRACTION     Patient Active Problem List   Diagnosis Date Noted   Tendinitis of right rotator cuff 03/03/2022   Biceps tendinitis of right shoulder 03/03/2022   Cough 05/18/2015   ABDOMINAL PAIN 05/26/2009   CHANGE IN BOWELS 01/27/2009   CONSTIPATION, CHRONIC 12/11/2008   ENDOMETRIOSIS 12/11/2008   IRRITABLE BOWEL SYNDROME, HX OF 12/11/2008    PCP: Onnie Graham FNP   REFERRING PROVIDER: Dr Huel Cote   REFERRING  DIAG:  Diagnosis  M75.81 (ICD-10-CM) - Tendinitis of right rotator cuff  RTC repair   THERAPY DIAG:  Chronic right shoulder pain  Muscle weakness (generalized)  Stiffness of right shoulder, not elsewhere classified  Pain in right elbow  Rationale for Evaluation and Treatment: Rehabilitation  ONSET DATE: 03/03/2022  Days since surgery: 29   SUBJECTIVE:  SUBJECTIVE STATEMENT: The patient reports it has been doing OK. She is having somewhat less pain.    PERTINENT HISTORY: Ocular Migranes  PAIN:  Are you having pain? Yes: NPRS scale: 3-4/10 prior to catch very little pain  Pain location: right anterior shoulder  Pain description: right shoulder pain  Aggravating factors: use of the shoulder/ sleeping  Relieving factors: rest, tylenol   PRECAUTIONS: Shoulder follow protocol   WEIGHT BEARING RESTRICTIONS: Yes NWB   FALLS:  Has patient fallen in last 6 months? No  LIVING ENVIRONMENT:  OCCUPATION: Stay at home mom.   Hobbies: work around [employer] doing house rehab, cooking.  PLOF: Independent  PATIENT GOALS:    NEXT MD VISIT:   OBJECTIVE:   DIAGNOSTIC FINDINGS:  Nothing post op PATIENT SURVEYS:  FOTO    COGNITION: Overall cognitive status: Within functional limits for tasks assessed     SENSATION: A little numbness in the hand but it has improved   POSTURE: Good.   UPPER EXTREMITY ROM:   Passive ROM Right eval Left eval  Shoulder flexion 75 No limit  Shoulder extension    Shoulder abduction    Shoulder adduction    Shoulder internal rotation 45 No limit      Shoulder external rotation 5 No limit   Elbow flexion    Elbow extension    Wrist flexion    Wrist extension    Wrist ulnar deviation    Wrist radial deviation    Wrist pronation    Wrist supination     (Blank rows = not tested)  UPPER EXTREMITY MMT:  MMT Right eval Left eval  Shoulder flexion    Shoulder extension    Shoulder abduction    Shoulder adduction    Shoulder internal rotation    Shoulder external rotation    Middle trapezius    Lower trapezius    Elbow flexion    Elbow extension    Wrist flexion    Wrist extension    Wrist ulnar deviation    Wrist radial deviation    Wrist pronation    Wrist supination    Grip strength (lbs)    (Blank rows = not tested) not tested at this time secondary to recent surgery    PALPATION:  No unexpected tenderness ot palpation    TODAY'S TREATMENT:                                                                                                                                         DATE:   1/26 Manual: PROM into flexion and er per protocol limits; LAD with oscillation during PROM; grade I and II inferior and posterior glides.   Flexion 100; ABD 75; ER 5; IR belly  Scap retraction 2x10   Table slide 10x 5 sec hold   ER with wand 10x 5 sec hold.  Extensive education on technique and reduction of pain.    03/25/2022 Manual: PROM into flexion and er per protocol limits; LAD with oscillation during PROM; grade I and II inferior and posterior glides.   Flexion 100; ABD 75; ER 10; IR belly  Scpa retraction, rolls AP and PAx10  Neck stretching Sling adjustment Self Elbow flexion and extension without stretching extension x10   03/25/2022 Manual: PROM into flexion and er per protocol limits; trigger point release to upper trap; grade I and II inferior and posterior glides.   Scpa retraction to neutral x10  Self Elbow flexion and extension without stretching extension x10    03/15/2021 Manual: PROM into flexion and er per protocol limits; trigger point release to upper trap  Scpa retraction to neutral x10  Self Elbow flexion and extension without stretching extension x10    Eval: Manual: Gentle passive  range of motion per protocol limits.  Reviewed pendulums patient reported minor pain.  Patient advised to try again later or tomorrow. Reviewed wrist and grip strengthening options.  PATIENT EDUCATION: Education details: HEP, symptom management, progression of activity.  Importance of sling use. Person educated: Patient Education method: Explanation, Demonstration, Tactile cues, Verbal cues, and Handouts Education comprehension: verbalized understanding, returned demonstration, verbal cues required, tactile cues required, and needs further education  HOME EXERCISE PROGRAM: Access Code: [email or web address] URL: [email or web address] Date: 03/08/2022 Prepared by: Carolyne Littles  Exercises - Reviewed pendulums - PROM into ER and flexion   ASSESSMENT:  CLINICAL IMPRESSION:  Per protocol the patient can begin AAROM at 4 weeks. She has been very tight in ER so we want to start working on improving ER at home. She was given a cane stretch to work on at home. She was advised not to be overly aggressive with it. Just feel a stretch like we do int therapy. She was also given a table slide. Therapy will continue to progress as tolerated. HE ER was slightly more limited today then last visit.    OBJECTIVE IMPAIRMENTS: decreased activity tolerance, decreased mobility, decreased ROM, decreased strength, impaired UE functional use, and pain.   ACTIVITY LIMITATIONS: carrying, lifting, sleeping, bathing, dressing, self feeding, reach over head, and hygiene/grooming  PARTICIPATION LIMITATIONS: meal prep, cleaning, laundry, community activity, occupation, and yard work  PERSONAL FACTORS:none    REHAB POTENTIAL: Excellent  CLINICAL DECISION MAKING: Stable/uncomplicated  EVALUATION COMPLEXITY: Low   GOALS: Goals reviewed with patient? Yes  SHORT TERM GOALS: Target date: 04/06/2022    Patient will increase passive right shoulder flexion to 110 degrees Baseline: Goal status:  INITIAL  2.  Patient will increase passive right shoulder external rotation to 30 degrees Baseline:  Goal status: INITIAL  3.  Patient will be independent with basic HEP following protocol suggestions Baseline:  Goal status: INITIAL   LONG TERM GOALS: Target date: 05/03/2022    Patient will reach overhead to cabinet with 2 pound weight without pain by the end of rehab Baseline:  Goal status: INITIAL  2.  Patient will reach behind her head to do her hair without pain Baseline:  Goal status: INITIAL  3.  Patient will reach behind her back without pain Baseline:  Goal status: INITIAL  PLAN:  PT FREQUENCY: 2x/week  PT DURATION: 8 weeks  PLANNED INTERVENTIONS: Therapeutic exercises, Therapeutic activity, Neuromuscular re-education, Patient/Family education, Self Care, Joint mobilization, Aquatic Therapy, Dry Needling, Cryotherapy, Moist heat, Ultrasound, Ionotophoresis 4mg /ml Dexamethasone, and Manual therapy  PLAN FOR NEXT SESSION: Begin with passive range of motion per protocol.  Carney Living, PT 04/01/2022, 1:31 PM

## 2022-04-04 ENCOUNTER — Ambulatory Visit (HOSPITAL_BASED_OUTPATIENT_CLINIC_OR_DEPARTMENT_OTHER): Payer: BC Managed Care – PPO

## 2022-04-04 ENCOUNTER — Encounter (HOSPITAL_BASED_OUTPATIENT_CLINIC_OR_DEPARTMENT_OTHER): Payer: Self-pay

## 2022-04-04 DIAGNOSIS — M7581 Other shoulder lesions, right shoulder: Secondary | ICD-10-CM | POA: Diagnosis not present

## 2022-04-04 DIAGNOSIS — G8929 Other chronic pain: Secondary | ICD-10-CM

## 2022-04-04 DIAGNOSIS — M25611 Stiffness of right shoulder, not elsewhere classified: Secondary | ICD-10-CM

## 2022-04-04 DIAGNOSIS — M6281 Muscle weakness (generalized): Secondary | ICD-10-CM

## 2022-04-04 NOTE — Therapy (Signed)
OUTPATIENT PHYSICAL THERAPY SHOULDER EVALUATION   Patient Name: Destiny Gardner MRN: [id number] DOB:1965/12/19, 57 y.o., female Today's Date: 04/04/2022  END OF SESSION:  PT End of Session - 04/04/22 1423     Visit Number 8    Number of Visits 16    Date for PT Re-Evaluation 05/03/22    PT Start Time 1430    PT Stop Time 1509    PT Time Calculation (min) 39 min    Activity Tolerance Patient limited by pain    Behavior During Therapy [facility] for tasks assessed/performed              Past Medical History:  Diagnosis Date   Complication of anesthesia    post-op N/V, waking up during rhinoplasty and colonoscopy   Endometriosis    Headache    Hx Occular Migraine   Inflammatory bowel disease    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,now with constipation 2010   PONV (postoperative nausea and vomiting)    Past Surgical History:  Procedure Laterality Date   COLONOSCOPY  05/05/2009   TCS/EGD :noraml stomach,colon,and duodenum   COLONOSCOPY  2020   ESOPHAGOGASTRODUODENOSCOPY  05/05/2009   with TCS   HERNIA REPAIR  123456   Umbilical Hernia   LAPAROTOMY  03/07/1997   endometriosis   MYRINGOTOMY     Tubes,Tympanoplasty   RHINOPLASTY  1989   SHOULDER ARTHROSCOPY WITH ROTATOR CUFF REPAIR AND OPEN BICEPS TENODESIS Right 03/03/2022   Procedure: RIGHT SHOULDER ARTHROSCOPY WITH ROTATOR CUFF REPAIR AND BICEPS TENODESIS;  Surgeon: Vanetta Mulders, MD;  Location: [place];  Service: Orthopedics;  Laterality: Right;   [place] EXTRACTION     Patient Active Problem List   Diagnosis Date Noted   Tendinitis of right rotator cuff 03/03/2022   Biceps tendinitis of right shoulder 03/03/2022   Cough 05/18/2015   ABDOMINAL PAIN 05/26/2009   CHANGE IN BOWELS 01/27/2009   CONSTIPATION, CHRONIC 12/11/2008   ENDOMETRIOSIS 12/11/2008   IRRITABLE BOWEL SYNDROME, HX OF 12/11/2008    PCP: Oliver Pila FNP   REFERRING PROVIDER: Dr Vanetta Mulders   REFERRING DIAG:   Diagnosis  M75.81 (ICD-10-CM) - Tendinitis of right rotator cuff  RTC repair   THERAPY DIAG:  Chronic right shoulder pain  Muscle weakness (generalized)  Stiffness of right shoulder, not elsewhere classified  Rationale for Evaluation and Treatment: Rehabilitation  ONSET DATE: 03/03/2022  Days since surgery: 32   SUBJECTIVE:  SUBJECTIVE STATEMENT: Pt reports her shoulder catches at times, but does not hurt at rest. She feels most discomfort in biceps area.    PERTINENT HISTORY: Ocular Migranes  PAIN:  Are you having pain? Yes: NPRS scale: 3-4/10 prior to catch very little pain  Pain location: right anterior shoulder  Pain description: right shoulder pain  Aggravating factors: use of the shoulder/ sleeping  Relieving factors: rest, tylenol   PRECAUTIONS: Shoulder follow protocol   WEIGHT BEARING RESTRICTIONS: Yes NWB   FALLS:  Has patient fallen in last 6 months? No  LIVING ENVIRONMENT:  OCCUPATION: Stay at home mom.   Hobbies: work around [employer] doing house rehab, cooking.  PLOF: Independent  PATIENT GOALS:    NEXT MD VISIT:   OBJECTIVE:   DIAGNOSTIC FINDINGS:  Nothing post op PATIENT SURVEYS:  FOTO    COGNITION: Overall cognitive status: Within functional limits for tasks assessed     SENSATION: A little numbness in the hand but it has improved   POSTURE: Good.   UPPER EXTREMITY ROM:   Passive ROM Right eval Left eval  Shoulder flexion 75 No limit  Shoulder extension    Shoulder abduction    Shoulder adduction    Shoulder internal rotation 45 No limit      Shoulder external rotation 5 No limit   Elbow flexion    Elbow extension    Wrist flexion    Wrist extension    Wrist ulnar deviation    Wrist radial deviation    Wrist pronation    Wrist  supination    (Blank rows = not tested)  UPPER EXTREMITY MMT:  MMT Right eval Left eval  Shoulder flexion    Shoulder extension    Shoulder abduction    Shoulder adduction    Shoulder internal rotation    Shoulder external rotation    Middle trapezius    Lower trapezius    Elbow flexion    Elbow extension    Wrist flexion    Wrist extension    Wrist ulnar deviation    Wrist radial deviation    Wrist pronation    Wrist supination    Grip strength (lbs)    (Blank rows = not tested) not tested at this time secondary to recent surgery    PALPATION:  No unexpected tenderness to palpation    TODAY'S TREATMENT:                                                                                                                                         DATE:    1/29 Manual: PROM into flexion and er per protocol limits; LAD with oscillation during PROM   STM to proximal biceps and lats  Scap retraction 2x10   Table slide 10x 5 sec hold   ER with wand 10x 5 sec hold.  Pendulums x20ea  Well arm assisted supine flexion x5 (small range, no pain)    1/26 Manual: PROM into flexion and er per protocol limits; LAD with oscillation during PROM; grade I and II inferior and posterior glides.   Flexion 100; ABD 75; ER 5; IR belly  Scap retraction 2x10   Table slide 10x 5 sec hold   ER with wand 10x 5 sec hold.           Extensive education on technique and reduction of pain.    03/25/2022 Manual: PROM into flexion and er per protocol limits; LAD with oscillation during PROM; grade I and II inferior and posterior glides.   Flexion 100; ABD 75; ER 10; IR belly  Scpa retraction, rolls AP and PAx10  Neck stretching Sling adjustment Self Elbow flexion and extension without stretching extension x10   03/25/2022 Manual: PROM into flexion and er per protocol limits; trigger point release to upper trap; grade I and II inferior and posterior glides.   Scpa retraction to  neutral x10  Self Elbow flexion and extension without stretching extension x10    03/15/2021 Manual: PROM into flexion and er per protocol limits; trigger point release to upper trap  Scpa retraction to neutral x10  Self Elbow flexion and extension without stretching extension x10    Eval: Manual: Gentle passive range of motion per protocol limits.  Reviewed pendulums patient reported minor pain.  Patient advised to try again later or tomorrow. Reviewed wrist and grip strengthening options.  PATIENT EDUCATION: Education details: HEP, symptom management, progression of activity.  Importance of sling use. Person educated: Patient Education method: Explanation, Demonstration, Tactile cues, Verbal cues, and Handouts Education comprehension: verbalized understanding, returned demonstration, verbal cues required, tactile cues required, and needs further education  HOME EXERCISE PROGRAM: Access Code: [email or web address] URL: [email or web address] Date: 03/08/2022 Prepared by: Carolyne Littles  Exercises - Reviewed pendulums - PROM into ER and flexion   ASSESSMENT:  CLINICAL IMPRESSION:  Reviewed ER with cane as pt questioned her performance with HEP. She is very guarded with PROM today and c/o frequent "catching" in shoulder. Very tender to palpation of proximal biceps with significant tightness noted here as well as in axilla area. She was instructed to use ice following today's session and to remain within pain limitations with HEP.    OBJECTIVE IMPAIRMENTS: decreased activity tolerance, decreased mobility, decreased ROM, decreased strength, impaired UE functional use, and pain.   ACTIVITY LIMITATIONS: carrying, lifting, sleeping, bathing, dressing, self feeding, reach over head, and hygiene/grooming  PARTICIPATION LIMITATIONS: meal prep, cleaning, laundry, community activity, occupation, and yard work  PERSONAL FACTORS:none    REHAB POTENTIAL: Excellent  CLINICAL DECISION  MAKING: Stable/uncomplicated  EVALUATION COMPLEXITY: Low   GOALS: Goals reviewed with patient? Yes  SHORT TERM GOALS: Target date: 04/06/2022    Patient will increase passive right shoulder flexion to 110 degrees Baseline: Goal status: INITIAL  2.  Patient will increase passive right shoulder external rotation to 30 degrees Baseline:  Goal status: INITIAL  3.  Patient will be independent with basic HEP following protocol suggestions Baseline:  Goal status: INITIAL   LONG TERM GOALS: Target date: 05/03/2022    Patient will reach overhead to cabinet with 2 pound weight without pain by the end of rehab Baseline:  Goal status: INITIAL  2.  Patient will reach behind her head to do her hair without pain Baseline:  Goal status: INITIAL  3.  Patient will reach behind her back without pain Baseline:  Goal  status: INITIAL  PLAN:  PT FREQUENCY: 2x/week  PT DURATION: 8 weeks  PLANNED INTERVENTIONS: Therapeutic exercises, Therapeutic activity, Neuromuscular re-education, Patient/Family education, Self Care, Joint mobilization, Aquatic Therapy, Dry Needling, Cryotherapy, Moist heat, Ultrasound, Ionotophoresis 4mg /ml Dexamethasone, and Manual therapy  PLAN FOR NEXT SESSION: Begin with passive range of motion per protocol.    Sherlynn Carbon, PTA  04/04/2022, 3:30 PM

## 2022-04-08 ENCOUNTER — Ambulatory Visit (HOSPITAL_BASED_OUTPATIENT_CLINIC_OR_DEPARTMENT_OTHER): Payer: BC Managed Care – PPO | Attending: Family Medicine | Admitting: Physical Therapy

## 2022-04-08 ENCOUNTER — Encounter (HOSPITAL_BASED_OUTPATIENT_CLINIC_OR_DEPARTMENT_OTHER): Payer: Self-pay | Admitting: Physical Therapy

## 2022-04-08 DIAGNOSIS — M6281 Muscle weakness (generalized): Secondary | ICD-10-CM | POA: Insufficient documentation

## 2022-04-08 DIAGNOSIS — G8929 Other chronic pain: Secondary | ICD-10-CM | POA: Diagnosis present

## 2022-04-08 DIAGNOSIS — M25511 Pain in right shoulder: Secondary | ICD-10-CM | POA: Insufficient documentation

## 2022-04-08 DIAGNOSIS — M25521 Pain in right elbow: Secondary | ICD-10-CM | POA: Diagnosis present

## 2022-04-08 DIAGNOSIS — M25611 Stiffness of right shoulder, not elsewhere classified: Secondary | ICD-10-CM | POA: Insufficient documentation

## 2022-04-08 NOTE — Therapy (Signed)
OUTPATIENT PHYSICAL THERAPY SHOULDER EVALUATION   Patient Name: Destiny Gardner MRN: [id number] DOB:1965/12/19, 57 y.o., female Today's Date: 04/04/2022  END OF SESSION:  PT End of Session - 04/04/22 1423     Visit Number 8    Number of Visits 16    Date for PT Re-Evaluation 05/03/22    PT Start Time 1430    PT Stop Time 1509    PT Time Calculation (min) 39 min    Activity Tolerance Patient limited by pain    Behavior During Therapy [facility] for tasks assessed/performed              Past Medical History:  Diagnosis Date   Complication of anesthesia    post-op N/V, waking up during rhinoplasty and colonoscopy   Endometriosis    Headache    Hx Occular Migraine   Inflammatory bowel disease    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,now with constipation 2010   PONV (postoperative nausea and vomiting)    Past Surgical History:  Procedure Laterality Date   COLONOSCOPY  05/05/2009   TCS/EGD :noraml stomach,colon,and duodenum   COLONOSCOPY  2020   ESOPHAGOGASTRODUODENOSCOPY  05/05/2009   with TCS   HERNIA REPAIR  123456   Umbilical Hernia   LAPAROTOMY  03/07/1997   endometriosis   MYRINGOTOMY     Tubes,Tympanoplasty   RHINOPLASTY  1989   SHOULDER ARTHROSCOPY WITH ROTATOR CUFF REPAIR AND OPEN BICEPS TENODESIS Right 03/03/2022   Procedure: RIGHT SHOULDER ARTHROSCOPY WITH ROTATOR CUFF REPAIR AND BICEPS TENODESIS;  Surgeon: Vanetta Mulders, MD;  Location: [place];  Service: Orthopedics;  Laterality: Right;   [place] EXTRACTION     Patient Active Problem List   Diagnosis Date Noted   Tendinitis of right rotator cuff 03/03/2022   Biceps tendinitis of right shoulder 03/03/2022   Cough 05/18/2015   ABDOMINAL PAIN 05/26/2009   CHANGE IN BOWELS 01/27/2009   CONSTIPATION, CHRONIC 12/11/2008   ENDOMETRIOSIS 12/11/2008   IRRITABLE BOWEL SYNDROME, HX OF 12/11/2008    PCP: Oliver Pila FNP   REFERRING PROVIDER: Dr Vanetta Mulders   REFERRING DIAG:   Diagnosis  M75.81 (ICD-10-CM) - Tendinitis of right rotator cuff  RTC repair   THERAPY DIAG:  Chronic right shoulder pain  Muscle weakness (generalized)  Stiffness of right shoulder, not elsewhere classified  Rationale for Evaluation and Treatment: Rehabilitation  ONSET DATE: 03/03/2022  Days since surgery: 32   SUBJECTIVE:  SUBJECTIVE STATEMENT: Pt reports she had a sharp pain at 4:30 in the morning this morning. She reports a catching in her bicpes area.    PERTINENT HISTORY: Ocular Migranes  PAIN:  Are you having pain? Yes: NPRS scale: 3-4/10 still the same  Pain location: right anterior shoulder  Pain description: right shoulder pain  Aggravating factors: use of the shoulder/ sleeping  Relieving factors: rest, tylenol   PRECAUTIONS: Shoulder follow protocol   WEIGHT BEARING RESTRICTIONS: Yes NWB   FALLS:  Has patient fallen in last 6 months? No  LIVING ENVIRONMENT:  OCCUPATION: Stay at home mom.   Hobbies: work around [employer] doing house rehab, cooking.  PLOF: Independent  PATIENT GOALS:    NEXT MD VISIT:   OBJECTIVE:   DIAGNOSTIC FINDINGS:  Nothing post op PATIENT SURVEYS:  FOTO    COGNITION: Overall cognitive status: Within functional limits for tasks assessed     SENSATION: A little numbness in the hand but it has improved   POSTURE: Good.   UPPER EXTREMITY ROM:   Passive ROM Right eval Left eval  Shoulder flexion 75 No limit  Shoulder extension    Shoulder abduction    Shoulder adduction    Shoulder internal rotation 45 No limit      Shoulder external rotation 5 No limit   Elbow flexion    Elbow extension    Wrist flexion    Wrist extension    Wrist ulnar deviation    Wrist radial deviation    Wrist pronation    Wrist supination    (Blank  rows = not tested)  UPPER EXTREMITY MMT:  MMT Right eval Left eval  Shoulder flexion    Shoulder extension    Shoulder abduction    Shoulder adduction    Shoulder internal rotation    Shoulder external rotation    Middle trapezius    Lower trapezius    Elbow flexion    Elbow extension    Wrist flexion    Wrist extension    Wrist ulnar deviation    Wrist radial deviation    Wrist pronation    Wrist supination    Grip strength (lbs)    (Blank rows = not tested) not tested at this time secondary to recent surgery    PALPATION:  No unexpected tenderness to palpation    TODAY'S TREATMENT:                                                                                                                                         DATE:  2/2 Manual: PROM into flexion and er per protocol limits; LAD with oscillation during PROM   STM to proximal biceps and lats   1/29 Manual: PROM into flexion and er per protocol limits; LAD with oscillation during PROM   STM to proximal biceps and lats  Scap retraction 2x10   Table slide  10x 5 sec hold   ER with wand 10x 5 sec hold.           Pendulums x20ea  Well arm assisted supine flexion x5 (small range, no pain)     PATIENT EDUCATION: Education details: HEP, symptom management, progression of activity.  Importance of sling use. Person educated: Patient Education method: Explanation, Demonstration, Tactile cues, Verbal cues, and Handouts Education comprehension: verbalized understanding, returned demonstration, verbal cues required, tactile cues required, and needs further education  HOME EXERCISE PROGRAM: Access Code: [email or web address] URL: [email or web address] Date: 03/08/2022 Prepared by: Carolyne Littles  Exercises - Reviewed pendulums - PROM into ER and flexion   ASSESSMENT:  CLINICAL IMPRESSION:  Therapy focused on manual therapy today. She was advised over the weekend just to do pendulums. She had improved  motion with manual therapy. She is concerned over the pain in her shoulder. She will ice and let it rest over the weekend. She does report this pain isn't new . She has ahd some pain along the way that has been similar.  OBJECTIVE IMPAIRMENTS: decreased activity tolerance, decreased mobility, decreased ROM, decreased strength, impaired UE functional use, and pain.   ACTIVITY LIMITATIONS: carrying, lifting, sleeping, bathing, dressing, self feeding, reach over head, and hygiene/grooming  PARTICIPATION LIMITATIONS: meal prep, cleaning, laundry, community activity, occupation, and yard work  PERSONAL FACTORS:none    REHAB POTENTIAL: Excellent  CLINICAL DECISION MAKING: Stable/uncomplicated  EVALUATION COMPLEXITY: Low   GOALS: Goals reviewed with patient? Yes  SHORT TERM GOALS: Target date: 04/06/2022    Patient will increase passive right shoulder flexion to 110 degrees Baseline: Goal status: INITIAL  2.  Patient will increase passive right shoulder external rotation to 30 degrees Baseline:  Goal status: INITIAL  3.  Patient will be independent with basic HEP following protocol suggestions Baseline:  Goal status: INITIAL   LONG TERM GOALS: Target date: 05/03/2022    Patient will reach overhead to cabinet with 2 pound weight without pain by the end of rehab Baseline:  Goal status: INITIAL  2.  Patient will reach behind her head to do her hair without pain Baseline:  Goal status: INITIAL  3.  Patient will reach behind her back without pain Baseline:  Goal status: INITIAL  PLAN:  PT FREQUENCY: 2x/week  PT DURATION: 8 weeks  PLANNED INTERVENTIONS: Therapeutic exercises, Therapeutic activity, Neuromuscular re-education, Patient/Family education, Self Care, Joint mobilization, Aquatic Therapy, Dry Needling, Cryotherapy, Moist heat, Ultrasound, Ionotophoresis 4mg /ml Dexamethasone, and Manual therapy  PLAN FOR NEXT SESSION: Begin with passive range of motion per  protocol.   Carolyne Littles PT DPT   04/04/2022, 3:30 PM

## 2022-04-12 ENCOUNTER — Ambulatory Visit (HOSPITAL_BASED_OUTPATIENT_CLINIC_OR_DEPARTMENT_OTHER): Payer: BC Managed Care – PPO | Admitting: Physical Therapy

## 2022-04-12 ENCOUNTER — Encounter (HOSPITAL_BASED_OUTPATIENT_CLINIC_OR_DEPARTMENT_OTHER): Payer: Self-pay | Admitting: Physical Therapy

## 2022-04-12 DIAGNOSIS — G8929 Other chronic pain: Secondary | ICD-10-CM

## 2022-04-12 DIAGNOSIS — M25521 Pain in right elbow: Secondary | ICD-10-CM

## 2022-04-12 DIAGNOSIS — M25511 Pain in right shoulder: Secondary | ICD-10-CM | POA: Diagnosis not present

## 2022-04-12 DIAGNOSIS — M6281 Muscle weakness (generalized): Secondary | ICD-10-CM

## 2022-04-12 DIAGNOSIS — M25611 Stiffness of right shoulder, not elsewhere classified: Secondary | ICD-10-CM

## 2022-04-13 ENCOUNTER — Other Ambulatory Visit (HOSPITAL_BASED_OUTPATIENT_CLINIC_OR_DEPARTMENT_OTHER): Payer: Self-pay

## 2022-04-13 ENCOUNTER — Ambulatory Visit (INDEPENDENT_AMBULATORY_CARE_PROVIDER_SITE_OTHER): Payer: BC Managed Care – PPO | Admitting: Orthopaedic Surgery

## 2022-04-13 DIAGNOSIS — M7581 Other shoulder lesions, right shoulder: Secondary | ICD-10-CM

## 2022-04-13 MED ORDER — METHYLPREDNISOLONE 4 MG PO TBPK
ORAL_TABLET | ORAL | 0 refills | Status: AC
  Filled 2022-04-13: qty 21, 6d supply, fill #0

## 2022-04-13 NOTE — Progress Notes (Signed)
Post Operative Evaluation    Procedure/Date of Surgery: Right shoulder rotator cuff repair biceps tenodesis 03/03/22  Interval History:    Destiny Gardner presents today for 6-week follow-up following right shoulder rotator cuff repair.  Overall she is still having pain in the glenohumeral area with difficulty with overhead motion.  She has been diligently working with physical therapy although this has been moving somewhat slowly.  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,now with constipation 2010   PONV (postoperative nausea and vomiting)    Past Surgical History:  Procedure Laterality Date   COLONOSCOPY  05/05/2009   TCS/EGD :noraml stomach,colon,and duodenum   COLONOSCOPY  2020   ESOPHAGOGASTRODUODENOSCOPY  05/05/2009   with TCS   HERNIA REPAIR  [id number]   Umbilical Hernia   LAPAROTOMY  03/07/1997   endometriosis   MYRINGOTOMY     Tubes,Tympanoplasty   RHINOPLASTY  1989   SHOULDER ARTHROSCOPY WITH ROTATOR CUFF REPAIR AND OPEN BICEPS TENODESIS Right 03/03/2022   Procedure: RIGHT SHOULDER ARTHROSCOPY WITH ROTATOR CUFF REPAIR AND BICEPS TENODESIS;  Surgeon: Vanetta Mulders, MD;  Location: [place];  Service: Orthopedics;  Laterality: Right;   Tonillectomy  1990   WISDOM TOOTH EXTRACTION     Social History   Socioeconomic History   Marital status: Married    Spouse name: Not on file   Number of children: Not on file   Years of education: Not on file   Highest education level: Not on file  Occupational History   Not on file  Tobacco Use   Smoking status: Never   Smokeless tobacco: Never  Substance and Sexual Activity   Alcohol use: Yes    Alcohol/week: 0.0 standard drinks of alcohol     Comment: social   Drug use: No   Sexual activity: Yes  Other Topics Concern   Not on file  Social History Narrative   Not on file   Social Determinants of Health   Financial Resource Strain: Not on file  Food Insecurity: Not on file  Transportation Needs: Not on file  Physical Activity: Not on file  Stress: Not on file  Social Connections: Not on file   Family History  Problem Relation Age of Onset   COPD Mother        never smoker   Prostate cancer Father    Allergies  Allergen Reactions   Codeine Nausea And Vomiting   Ketorolac Tromethamine    Penicillins Hives   Chlorhexidine Rash   Current Outpatient Medications  Medication Sig Dispense Refill   Ascorbic Acid (VITAMIN C PO) Take 1 tablet by mouth daily.     aspirin EC 325 MG tablet Take 1 tablet (325 mg total) by mouth daily. 30 tablet 0   oxyCODONE (OXY IR/ROXICODONE) 5 MG immediate release tablet Take 1 tablet (5 mg total) by mouth every 4 (four) hours as needed (severe pain). 10 tablet 0   No current facility-administered medications for this visit.   No results found.  Review of Systems:   A ROS was performed including pertinent positives and negatives  as documented in the HPI.   Musculoskeletal Exam:      Right shoulder incisions are well-appearing without erythema or drainage.  In the supine position she is able to forward elevate to approximately 90 degrees with external rotation at the side 20 degrees.  These are both somewhat painful.  Active forward elevation is to only 40 degrees with pain.  Internal rotation deferred today.   Imaging:      I personally reviewed and interpreted the radiographs.   Assessment:   6 weeks status post right shoulder rotator cuff repair.  Overall at today's visit I do believe that there is an early component of capsulitis and inflammation given the fact that she is tender over the anterior portal and glenohumeral joint.  I did perform a bedside ultrasound which  showed an intact rotator cuff as well as biceps tenodesis.  I did describe that given her inflammatory type pain that I would recommend something stronger than Mobic such as a Medrol Dosepak.  I do believe this will dramatically help her to work through physical therapy.  At this time I believe we need to aggressively continue to work through therapy as I am concerned that she will continue to refreeze if we cannot progress her range of motion.  At this time I would like to see her back sooner if that I can continue to follow her clinical progress Plan :    -Return to clinic 2 weeks for reassessment      I personally saw and evaluated the patient, and participated in the management and treatment plan.  Vanetta Mulders, MD Attending Physician, Orthopedic Surgery  This document was dictated using Dragon voice recognition software. A reasonable attempt at proof reading has been made to minimize errors.

## 2022-04-13 NOTE — Therapy (Signed)
OUTPATIENT PHYSICAL THERAPY SHOULDER EVALUATION   Patient Name: Destiny Gardner MRN: [id number] DOB:1965/12/19, 57 y.o., female Today's Date: 04/04/2022  END OF SESSION:  PT End of Session - 04/04/22 1423     Visit Number 8    Number of Visits 16    Date for PT Re-Evaluation 05/03/22    PT Start Time 1430    PT Stop Time 1509    PT Time Calculation (min) 39 min    Activity Tolerance Patient limited by pain    Behavior During Therapy [facility] for tasks assessed/performed              Past Medical History:  Diagnosis Date   Complication of anesthesia    post-op N/V, waking up during rhinoplasty and colonoscopy   Endometriosis    Headache    Hx Occular Migraine   Inflammatory bowel disease    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,now with constipation 2010   PONV (postoperative nausea and vomiting)    Past Surgical History:  Procedure Laterality Date   COLONOSCOPY  05/05/2009   TCS/EGD :noraml stomach,colon,and duodenum   COLONOSCOPY  2020   ESOPHAGOGASTRODUODENOSCOPY  05/05/2009   with TCS   HERNIA REPAIR  123456   Umbilical Hernia   LAPAROTOMY  03/07/1997   endometriosis   MYRINGOTOMY     Tubes,Tympanoplasty   RHINOPLASTY  1989   SHOULDER ARTHROSCOPY WITH ROTATOR CUFF REPAIR AND OPEN BICEPS TENODESIS Right 03/03/2022   Procedure: RIGHT SHOULDER ARTHROSCOPY WITH ROTATOR CUFF REPAIR AND BICEPS TENODESIS;  Surgeon: Vanetta Mulders, MD;  Location: [place];  Service: Orthopedics;  Laterality: Right;   [place] EXTRACTION     Patient Active Problem List   Diagnosis Date Noted   Tendinitis of right rotator cuff 03/03/2022   Biceps tendinitis of right shoulder 03/03/2022   Cough 05/18/2015   ABDOMINAL PAIN 05/26/2009   CHANGE IN BOWELS 01/27/2009   CONSTIPATION, CHRONIC 12/11/2008   ENDOMETRIOSIS 12/11/2008   IRRITABLE BOWEL SYNDROME, HX OF 12/11/2008    PCP: Oliver Pila FNP   REFERRING PROVIDER: Dr Vanetta Mulders   REFERRING DIAG:   Diagnosis  M75.81 (ICD-10-CM) - Tendinitis of right rotator cuff  RTC repair   THERAPY DIAG:  Chronic right shoulder pain  Muscle weakness (generalized)  Stiffness of right shoulder, not elsewhere classified  Rationale for Evaluation and Treatment: Rehabilitation  ONSET DATE: 03/03/2022  Days since surgery: 32   SUBJECTIVE:  SUBJECTIVE STATEMENT: Pt reports she had a sharp pain at 4:30 in the morning this morning. She reports a catching in her bicpes area.    PERTINENT HISTORY: Ocular Migranes  PAIN:  Are you having pain? Yes: NPRS scale: 3-4/10 still the same  Pain location: right anterior shoulder  Pain description: right shoulder pain  Aggravating factors: use of the shoulder/ sleeping  Relieving factors: rest, tylenol   PRECAUTIONS: Shoulder follow protocol   WEIGHT BEARING RESTRICTIONS: Yes NWB   FALLS:  Has patient fallen in last 6 months? No  LIVING ENVIRONMENT:  OCCUPATION: Stay at home mom.   Hobbies: work around [employer] doing house rehab, cooking.  PLOF: Independent  PATIENT GOALS:    NEXT MD VISIT:   OBJECTIVE:   DIAGNOSTIC FINDINGS:  Nothing post op PATIENT SURVEYS:  FOTO    COGNITION: Overall cognitive status: Within functional limits for tasks assessed     SENSATION: A little numbness in the hand but it has improved   POSTURE: Good.   UPPER EXTREMITY ROM:   Passive ROM Right eval Left eval  Shoulder flexion 75 No limit  Shoulder extension    Shoulder abduction    Shoulder adduction    Shoulder internal rotation 45 No limit      Shoulder external rotation 5 No limit   Elbow flexion    Elbow extension    Wrist flexion    Wrist extension    Wrist ulnar deviation    Wrist radial deviation    Wrist pronation    Wrist supination    (Blank  rows = not tested)  UPPER EXTREMITY MMT:  MMT Right eval Left eval  Shoulder flexion    Shoulder extension    Shoulder abduction    Shoulder adduction    Shoulder internal rotation    Shoulder external rotation    Middle trapezius    Lower trapezius    Elbow flexion    Elbow extension    Wrist flexion    Wrist extension    Wrist ulnar deviation    Wrist radial deviation    Wrist pronation    Wrist supination    Grip strength (lbs)    (Blank rows = not tested) not tested at this time secondary to recent surgery    PALPATION:  No unexpected tenderness to palpation    TODAY'S TREATMENT:                                                                                                                                         DATE:  2/2 Manual: PROM into flexion and er per protocol limits; LAD with oscillation during PROM   STM to proximal biceps and lats   1/29 Manual: PROM into flexion and er per protocol limits; LAD with oscillation during PROM   STM to proximal biceps and lats  Scap retraction 2x10   Table slide  10x 5 sec hold   ER with wand 10x 5 sec hold.           Pendulums x20ea  Well arm assisted supine flexion x5 (small range, no pain)     PATIENT EDUCATION: Education details: HEP, symptom management, progression of activity.  Importance of sling use. Person educated: Patient Education method: Explanation, Demonstration, Tactile cues, Verbal cues, and Handouts Education comprehension: verbalized understanding, returned demonstration, verbal cues required, tactile cues required, and needs further education  HOME EXERCISE PROGRAM: Access Code: [email or web address] URL: [email or web address] Date: 03/08/2022 Prepared by: Carolyne Littles  Exercises - Reviewed pendulums - PROM into ER and flexion   ASSESSMENT:  CLINICAL IMPRESSION: The patients pain was better today but she was still guarded with movements. We will re-introduce self range of motion  techniques at 6 weeks. She is limited in ER and flexion. She will see the MD today. We will proceed as tolerated.  OBJECTIVE IMPAIRMENTS: decreased activity tolerance, decreased mobility, decreased ROM, decreased strength, impaired UE functional use, and pain.   ACTIVITY LIMITATIONS: carrying, lifting, sleeping, bathing, dressing, self feeding, reach over head, and hygiene/grooming  PARTICIPATION LIMITATIONS: meal prep, cleaning, laundry, community activity, occupation, and yard work  PERSONAL FACTORS:none    REHAB POTENTIAL: Excellent  CLINICAL DECISION MAKING: Stable/uncomplicated  EVALUATION COMPLEXITY: Low   GOALS: Goals reviewed with patient? Yes  SHORT TERM GOALS: Target date: 04/06/2022    Patient will increase passive right shoulder flexion to 110 degrees Baseline: Goal status: INITIAL  2.  Patient will increase passive right shoulder external rotation to 30 degrees Baseline:  Goal status: INITIAL  3.  Patient will be independent with basic HEP following protocol suggestions Baseline:  Goal status: INITIAL   LONG TERM GOALS: Target date: 05/03/2022    Patient will reach overhead to cabinet with 2 pound weight without pain by the end of rehab Baseline:  Goal status: INITIAL  2.  Patient will reach behind her head to do her hair without pain Baseline:  Goal status: INITIAL  3.  Patient will reach behind her back without pain Baseline:  Goal status: INITIAL  PLAN:  PT FREQUENCY: 2x/week  PT DURATION: 8 weeks  PLANNED INTERVENTIONS: Therapeutic exercises, Therapeutic activity, Neuromuscular re-education, Patient/Family education, Self Care, Joint mobilization, Aquatic Therapy, Dry Needling, Cryotherapy, Moist heat, Ultrasound, Ionotophoresis 4mg /ml Dexamethasone, and Manual therapy  PLAN FOR NEXT SESSION: Begin with passive range of motion per protocol.   Carolyne Littles PT DPT   04/04/2022, 3:30 PM

## 2022-04-15 ENCOUNTER — Ambulatory Visit (HOSPITAL_BASED_OUTPATIENT_CLINIC_OR_DEPARTMENT_OTHER): Payer: BC Managed Care – PPO | Admitting: Physical Therapy

## 2022-04-15 ENCOUNTER — Encounter (HOSPITAL_BASED_OUTPATIENT_CLINIC_OR_DEPARTMENT_OTHER): Payer: Self-pay | Admitting: Physical Therapy

## 2022-04-15 DIAGNOSIS — M25611 Stiffness of right shoulder, not elsewhere classified: Secondary | ICD-10-CM

## 2022-04-15 DIAGNOSIS — M25511 Pain in right shoulder: Secondary | ICD-10-CM | POA: Diagnosis not present

## 2022-04-15 DIAGNOSIS — M6281 Muscle weakness (generalized): Secondary | ICD-10-CM

## 2022-04-15 DIAGNOSIS — M25521 Pain in right elbow: Secondary | ICD-10-CM

## 2022-04-15 DIAGNOSIS — G8929 Other chronic pain: Secondary | ICD-10-CM

## 2022-04-15 NOTE — Therapy (Signed)
OUTPATIENT PHYSICAL THERAPY SHOULDER EVALUATION   Patient Name: Destiny Gardner MRN: [id number] DOB:1965/12/19, 57 y.o., female Today's Date: 04/04/2022  END OF SESSION:  PT End of Session - 04/04/22 1423     Visit Number 8    Number of Visits 16    Date for PT Re-Evaluation 05/03/22    PT Start Time 1430    PT Stop Time 1509    PT Time Calculation (min) 39 min    Activity Tolerance Patient limited by pain    Behavior During Therapy [facility] for tasks assessed/performed              Past Medical History:  Diagnosis Date   Complication of anesthesia    post-op N/V, waking up during rhinoplasty and colonoscopy   Endometriosis    Headache    Hx Occular Migraine   Inflammatory bowel disease    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,now with constipation 2010   PONV (postoperative nausea and vomiting)    Past Surgical History:  Procedure Laterality Date   COLONOSCOPY  05/05/2009   TCS/EGD :noraml stomach,colon,and duodenum   COLONOSCOPY  2020   ESOPHAGOGASTRODUODENOSCOPY  05/05/2009   with TCS   HERNIA REPAIR  123456   Umbilical Hernia   LAPAROTOMY  03/07/1997   endometriosis   MYRINGOTOMY     Tubes,Tympanoplasty   RHINOPLASTY  1989   SHOULDER ARTHROSCOPY WITH ROTATOR CUFF REPAIR AND OPEN BICEPS TENODESIS Right 03/03/2022   Procedure: RIGHT SHOULDER ARTHROSCOPY WITH ROTATOR CUFF REPAIR AND BICEPS TENODESIS;  Surgeon: Vanetta Mulders, MD;  Location: [place];  Service: Orthopedics;  Laterality: Right;   [place] EXTRACTION     Patient Active Problem List   Diagnosis Date Noted   Tendinitis of right rotator cuff 03/03/2022   Biceps tendinitis of right shoulder 03/03/2022   Cough 05/18/2015   ABDOMINAL PAIN 05/26/2009   CHANGE IN BOWELS 01/27/2009   CONSTIPATION, CHRONIC 12/11/2008   ENDOMETRIOSIS 12/11/2008   IRRITABLE BOWEL SYNDROME, HX OF 12/11/2008    PCP: Oliver Pila FNP   REFERRING PROVIDER: Dr Vanetta Mulders   REFERRING DIAG:   Diagnosis  M75.81 (ICD-10-CM) - Tendinitis of right rotator cuff  RTC repair   THERAPY DIAG:  Chronic right shoulder pain  Muscle weakness (generalized)  Stiffness of right shoulder, not elsewhere classified  Rationale for Evaluation and Treatment: Rehabilitation  ONSET DATE: 03/03/2022  Days since surgery: 32   SUBJECTIVE:  SUBJECTIVE STATEMENT: The patient has been to the MD. She is on a dose pack of steroids. She is out of the sling. She is still having pain but she has only been on the steroids for a day.    PERTINENT HISTORY: Ocular Migranes  PAIN:  Are you having pain? Yes: NPRS scale: 3-4/10 still the same  Pain location: right anterior shoulder  Pain description: right shoulder pain  Aggravating factors: use of the shoulder/ sleeping  Relieving factors: rest, tylenol   PRECAUTIONS: Shoulder follow protocol   WEIGHT BEARING RESTRICTIONS: Yes NWB   FALLS:  Has patient fallen in last 6 months? No  LIVING ENVIRONMENT:  OCCUPATION: Stay at home mom.   Hobbies: work around [employer] doing house rehab, cooking.  PLOF: Independent  PATIENT GOALS:    NEXT MD VISIT:   OBJECTIVE:   DIAGNOSTIC FINDINGS:  Nothing post op PATIENT SURVEYS:  FOTO    COGNITION: Overall cognitive status: Within functional limits for tasks assessed     SENSATION: A little numbness in the hand but it has improved   POSTURE: Good.   UPPER EXTREMITY ROM:   Passive ROM Right eval Left eval  Shoulder flexion 75 No limit  Shoulder extension    Shoulder abduction    Shoulder adduction    Shoulder internal rotation 45 No limit      Shoulder external rotation 5 No limit   Elbow flexion    Elbow extension    Wrist flexion    Wrist extension    Wrist ulnar deviation    Wrist radial  deviation    Wrist pronation    Wrist supination    (Blank rows = not tested)  UPPER EXTREMITY MMT:  MMT Right eval Left eval  Shoulder flexion    Shoulder extension    Shoulder abduction    Shoulder adduction    Shoulder internal rotation    Shoulder external rotation    Middle trapezius    Lower trapezius    Elbow flexion    Elbow extension    Wrist flexion    Wrist extension    Wrist ulnar deviation    Wrist radial deviation    Wrist pronation    Wrist supination    Grip strength (lbs)    (Blank rows = not tested) not tested at this time secondary to recent surgery    PALPATION:  No unexpected tenderness to palpation    TODAY'S TREATMENT:                                                                                                                                         DATE:  2/9 Manual: PROM into flexion and er per protocol limits; LAD with oscillation during PROM   STM to proximal biceps and lats  ER with wand 10x 5 sec hold.   Pulleys 2 min     2/2  Manual: PROM into flexion and er per protocol limits; LAD with oscillation during PROM   STM to proximal biceps and lats   1/29 Manual: PROM into flexion and er per protocol limits; LAD with oscillation during PROM   STM to proximal biceps and lats  Scap retraction 2x10   Table slide 10x 5 sec hold   ER with wand 10x 5 sec hold.           Pendulums x20ea  Well arm assisted supine flexion x5 (small range, no pain)     PATIENT EDUCATION: Education details: HEP, symptom management, progression of activity.  Importance of sling use. Person educated: Patient Education method: Explanation, Demonstration, Tactile cues, Verbal cues, and Handouts Education comprehension: verbalized understanding, returned demonstration, verbal cues required, tactile cues required, and needs further education  HOME EXERCISE PROGRAM: Access Code: [email or web address] URL: [email or web address] Date:  03/08/2022 Prepared by: Carolyne Littles  Exercises - Reviewed pendulums - PROM into ER and flexion   ASSESSMENT:  CLINICAL IMPRESSION:  The patient had her best session yet. Her motion opened up well. We reviewed home exercises to perform. She performed wand ER without significant pain> She was also able to perform the pulley today. We will continue to advance as tolerated.    OBJECTIVE IMPAIRMENTS: decreased activity tolerance, decreased mobility, decreased ROM, decreased strength, impaired UE functional use, and pain.   ACTIVITY LIMITATIONS: carrying, lifting, sleeping, bathing, dressing, self feeding, reach over head, and hygiene/grooming  PARTICIPATION LIMITATIONS: meal prep, cleaning, laundry, community activity, occupation, and yard work  PERSONAL FACTORS:none    REHAB POTENTIAL: Excellent  CLINICAL DECISION MAKING: Stable/uncomplicated  EVALUATION COMPLEXITY: Low   GOALS: Goals reviewed with patient? Yes  SHORT TERM GOALS: Target date: 04/06/2022    Patient will increase passive right shoulder flexion to 110 degrees Baseline: Goal status: INITIAL  2.  Patient will increase passive right shoulder external rotation to 30 degrees Baseline:  Goal status: INITIAL  3.  Patient will be independent with basic HEP following protocol suggestions Baseline:  Goal status: INITIAL   LONG TERM GOALS: Target date: 05/03/2022    Patient will reach overhead to cabinet with 2 pound weight without pain by the end of rehab Baseline:  Goal status: INITIAL  2.  Patient will reach behind her head to do her hair without pain Baseline:  Goal status: INITIAL  3.  Patient will reach behind her back without pain Baseline:  Goal status: INITIAL  PLAN:  PT FREQUENCY: 2x/week  PT DURATION: 8 weeks  PLANNED INTERVENTIONS: Therapeutic exercises, Therapeutic activity, Neuromuscular re-education, Patient/Family education, Self Care, Joint mobilization, Aquatic Therapy, Dry  Needling, Cryotherapy, Moist heat, Ultrasound, Ionotophoresis 3m/ml Dexamethasone, and Manual therapy  PLAN FOR NEXT SESSION: Begin with passive range of motion per protocol.   DCarolyne LittlesPT DPT   04/04/2022, 3:30 PM

## 2022-04-17 ENCOUNTER — Encounter (HOSPITAL_BASED_OUTPATIENT_CLINIC_OR_DEPARTMENT_OTHER): Payer: Self-pay | Admitting: Orthopaedic Surgery

## 2022-04-19 ENCOUNTER — Ambulatory Visit (HOSPITAL_BASED_OUTPATIENT_CLINIC_OR_DEPARTMENT_OTHER): Payer: BC Managed Care – PPO

## 2022-04-19 ENCOUNTER — Encounter (HOSPITAL_BASED_OUTPATIENT_CLINIC_OR_DEPARTMENT_OTHER): Payer: Self-pay

## 2022-04-19 DIAGNOSIS — M25611 Stiffness of right shoulder, not elsewhere classified: Secondary | ICD-10-CM

## 2022-04-19 DIAGNOSIS — G8929 Other chronic pain: Secondary | ICD-10-CM

## 2022-04-19 DIAGNOSIS — M25511 Pain in right shoulder: Secondary | ICD-10-CM | POA: Diagnosis not present

## 2022-04-19 DIAGNOSIS — M6281 Muscle weakness (generalized): Secondary | ICD-10-CM

## 2022-04-19 NOTE — Therapy (Signed)
OUTPATIENT PHYSICAL THERAPY SHOULDER EVALUATION   Patient Name: Destiny Gardner MRN: [id number] DOB:25-Sep-1965, 57 y.o., female Today's Date: 04/19/2022  END OF SESSION:  PT End of Session - 04/19/22 1102     Visit Number 12    Number of Visits 16    Date for PT Re-Evaluation 05/03/22    PT Start Time 1102    PT Stop Time 1145    PT Time Calculation (min) 43 min    Activity Tolerance Patient tolerated treatment well    Behavior During Therapy WFL for tasks assessed/performed              Past Medical History:  Diagnosis Date   Complication of anesthesia    post-op N/V, waking up during rhinoplasty and colonoscopy   Endometriosis    Headache    Hx Occular Migraine   Inflammatory bowel disease    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,now with constipation 2010   PONV (postoperative nausea and vomiting)    Past Surgical History:  Procedure Laterality Date   COLONOSCOPY  05/05/2009   TCS/EGD :noraml stomach,colon,and duodenum   COLONOSCOPY  2020   ESOPHAGOGASTRODUODENOSCOPY  05/05/2009   with TCS   HERNIA REPAIR  123456   Umbilical Hernia   LAPAROTOMY  03/07/1997   endometriosis   MYRINGOTOMY     Tubes,Tympanoplasty   RHINOPLASTY  1989   SHOULDER ARTHROSCOPY WITH ROTATOR CUFF REPAIR AND OPEN BICEPS TENODESIS Right 03/03/2022   Procedure: RIGHT SHOULDER ARTHROSCOPY WITH ROTATOR CUFF REPAIR AND BICEPS TENODESIS;  Surgeon: Vanetta Mulders, MD;  Location: [place];  Service: Orthopedics;  Laterality: Right;   [place] EXTRACTION     Patient Active Problem List   Diagnosis Date Noted   Tendinitis of right rotator cuff 03/03/2022   Biceps tendinitis of right shoulder 03/03/2022   Cough 05/18/2015   ABDOMINAL PAIN 05/26/2009   CHANGE IN BOWELS 01/27/2009   CONSTIPATION, CHRONIC 12/11/2008   ENDOMETRIOSIS 12/11/2008   IRRITABLE BOWEL SYNDROME, HX OF 12/11/2008    PCP: Oliver Pila FNP   REFERRING PROVIDER: Dr Vanetta Mulders   REFERRING  DIAG:  Diagnosis  M75.81 (ICD-10-CM) - Tendinitis of right rotator cuff  RTC repair   THERAPY DIAG:  Chronic right shoulder pain  Muscle weakness (generalized)  Stiffness of right shoulder, not elsewhere classified  Rationale for Evaluation and Treatment: Rehabilitation  ONSET DATE: 03/03/2022  Days since surgery: 12   SUBJECTIVE:  SUBJECTIVE STATEMENT: Pt has finished steroid dose pack and feels slightly better overall.  She has been using pulleys at home.  Has pain with reaching that is sharp in nature and anterior shoulder.  And has minimal pain at rest.   PERTINENT HISTORY: Ocular Migranes  PAIN:  Are you having pain? Yes: NPRS scale: 3-4/10 still the same  Pain location: right anterior shoulder  Pain description: right shoulder pain  Aggravating factors: use of the shoulder/ sleeping  Relieving factors: rest, tylenol   PRECAUTIONS: Shoulder follow protocol   WEIGHT BEARING RESTRICTIONS: Yes NWB   FALLS:  Has patient fallen in last 6 months? No  LIVING ENVIRONMENT:  OCCUPATION: Stay at home mom.   Hobbies: work around [employer] doing house rehab, cooking.  PLOF: Independent  PATIENT GOALS:    NEXT MD VISIT:   OBJECTIVE:   DIAGNOSTIC FINDINGS:  Nothing post op PATIENT SURVEYS:  FOTO    COGNITION: Overall cognitive status: Within functional limits for tasks assessed     SENSATION: A little numbness in the hand but it has improved   POSTURE: Good.   UPPER EXTREMITY ROM:   Passive ROM Right eval Left eval  Shoulder flexion 75 No limit  Shoulder extension    Shoulder abduction    Shoulder adduction    Shoulder internal rotation 45 No limit      Shoulder external rotation 5 No limit   Elbow flexion    Elbow extension    Wrist flexion    Wrist extension     Wrist ulnar deviation    Wrist radial deviation    Wrist pronation    Wrist supination    (Blank rows = not tested)  UPPER EXTREMITY MMT:  MMT Right eval Left eval  Shoulder flexion    Shoulder extension    Shoulder abduction    Shoulder adduction    Shoulder internal rotation    Shoulder external rotation    Middle trapezius    Lower trapezius    Elbow flexion    Elbow extension    Wrist flexion    Wrist extension    Wrist ulnar deviation    Wrist radial deviation    Wrist pronation    Wrist supination    Grip strength (lbs)    (Blank rows = not tested) not tested at this time secondary to recent surgery    PALPATION:  No unexpected tenderness to palpation    TODAY'S TREATMENT:                                                                                                                                         DATE:  2/13:   Manual- Shoulder PROM, LAD, STM prox biceps  -Well Arm assisted supine flexion- x10 -pulleys 2' flexion -pendulums cw and ccw  2/9 Manual: PROM into flexion and er per protocol limits; LAD with oscillation during PROM  STM to proximal biceps and lats  ER with wand 10x 5 sec hold.   Pulleys 2 min     2/2 Manual: PROM into flexion and er per protocol limits; LAD with oscillation during PROM   STM to proximal biceps and lats   1/29 Manual: PROM into flexion and er per protocol limits; LAD with oscillation during PROM   STM to proximal biceps and lats  Scap retraction 2x10   Table slide 10x 5 sec hold   ER with wand 10x 5 sec hold.           Pendulums x20ea  Well arm assisted supine flexion x5 (small range, no pain)     PATIENT EDUCATION: Education details: HEP, symptom management, progression of activity.  Importance of sling use. Person educated: Patient Education method: Explanation, Demonstration, Tactile cues, Verbal cues, and Handouts Education comprehension: verbalized understanding, returned demonstration,  verbal cues required, tactile cues required, and needs further education  HOME EXERCISE PROGRAM: Access Code: [email or web address] URL: [email or web address] Date: 03/08/2022 Prepared by: Carolyne Littles  Exercises - Reviewed pendulums - PROM into ER and flexion   ASSESSMENT:  CLINICAL IMPRESSION:  Pt reports improved ability to move her arm with AAROM activities. She had decreased guarding with PROM with improved available range of motion.  Educated patient on appropriate activity progression and to stay within pain limitations.  Educated about DOMS and instructed her to use ice as needed.  Will continue to progress with protocol as tolerated.   OBJECTIVE IMPAIRMENTS: decreased activity tolerance, decreased mobility, decreased ROM, decreased strength, impaired UE functional use, and pain.   ACTIVITY LIMITATIONS: carrying, lifting, sleeping, bathing, dressing, self feeding, reach over head, and hygiene/grooming  PARTICIPATION LIMITATIONS: meal prep, cleaning, laundry, community activity, occupation, and yard work  PERSONAL FACTORS:none    REHAB POTENTIAL: Excellent  CLINICAL DECISION MAKING: Stable/uncomplicated  EVALUATION COMPLEXITY: Low   GOALS: Goals reviewed with patient? Yes  SHORT TERM GOALS: Target date: 04/06/2022    Patient will increase passive right shoulder flexion to 110 degrees Baseline: Goal status: INITIAL  2.  Patient will increase passive right shoulder external rotation to 30 degrees Baseline:  Goal status: INITIAL  3.  Patient will be independent with basic HEP following protocol suggestions Baseline:  Goal status: INITIAL   LONG TERM GOALS: Target date: 05/03/2022    Patient will reach overhead to cabinet with 2 pound weight without pain by the end of rehab Baseline:  Goal status: INITIAL  2.  Patient will reach behind her head to do her hair without pain Baseline:  Goal status: INITIAL  3.  Patient will reach behind her back  without pain Baseline:  Goal status: INITIAL  PLAN:  PT FREQUENCY: 2x/week  PT DURATION: 8 weeks  PLANNED INTERVENTIONS: Therapeutic exercises, Therapeutic activity, Neuromuscular re-education, Patient/Family education, Self Care, Joint mobilization, Aquatic Therapy, Dry Needling, Cryotherapy, Moist heat, Ultrasound, Ionotophoresis 38m/ml Dexamethasone, and Manual therapy  PLAN FOR NEXT SESSION: Begin with passive range of motion per protocol.   DCarolyne LittlesPT DPT   04/19/2022, 12:34 PM

## 2022-04-22 ENCOUNTER — Encounter (HOSPITAL_BASED_OUTPATIENT_CLINIC_OR_DEPARTMENT_OTHER): Payer: Self-pay

## 2022-04-22 ENCOUNTER — Ambulatory Visit (HOSPITAL_BASED_OUTPATIENT_CLINIC_OR_DEPARTMENT_OTHER): Payer: BC Managed Care – PPO

## 2022-04-22 DIAGNOSIS — M25611 Stiffness of right shoulder, not elsewhere classified: Secondary | ICD-10-CM

## 2022-04-22 DIAGNOSIS — G8929 Other chronic pain: Secondary | ICD-10-CM

## 2022-04-22 DIAGNOSIS — M25511 Pain in right shoulder: Secondary | ICD-10-CM | POA: Diagnosis not present

## 2022-04-22 DIAGNOSIS — M6281 Muscle weakness (generalized): Secondary | ICD-10-CM

## 2022-04-22 NOTE — Therapy (Signed)
OUTPATIENT PHYSICAL THERAPY SHOULDER EVALUATION   Patient Name: Destiny Gardner MRN: [id number] DOB:1965-10-01, 57 y.o., female Today's Date: 04/22/2022  END OF SESSION:  PT End of Session - 04/22/22 1046     Visit Number 13    Number of Visits 16    Date for PT Re-Evaluation 05/03/22    PT Start Time 1017    PT Stop Time 1104    PT Time Calculation (min) 47 min    Activity Tolerance Patient tolerated treatment well    Behavior During Therapy [facility] for tasks assessed/performed               Past Medical History:  Diagnosis Date   Complication of anesthesia    post-op N/V, waking up during rhinoplasty and colonoscopy   Endometriosis    Headache    Hx Occular Migraine   Inflammatory bowel disease    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,now with constipation 2010   PONV (postoperative nausea and vomiting)    Past Surgical History:  Procedure Laterality Date   COLONOSCOPY  05/05/2009   TCS/EGD :noraml stomach,colon,and duodenum   COLONOSCOPY  2020   ESOPHAGOGASTRODUODENOSCOPY  05/05/2009   with TCS   HERNIA REPAIR  123456   Umbilical Hernia   LAPAROTOMY  03/07/1997   endometriosis   MYRINGOTOMY     Tubes,Tympanoplasty   RHINOPLASTY  1989   SHOULDER ARTHROSCOPY WITH ROTATOR CUFF REPAIR AND OPEN BICEPS TENODESIS Right 03/03/2022   Procedure: RIGHT SHOULDER ARTHROSCOPY WITH ROTATOR CUFF REPAIR AND BICEPS TENODESIS;  Surgeon: Vanetta Mulders, MD;  Location: [place];  Service: Orthopedics;  Laterality: Right;   [place] EXTRACTION     Patient Active Problem List   Diagnosis Date Noted   Tendinitis of right rotator cuff 03/03/2022   Biceps tendinitis of right shoulder 03/03/2022   Cough 05/18/2015   ABDOMINAL PAIN 05/26/2009   CHANGE IN BOWELS 01/27/2009   CONSTIPATION, CHRONIC 12/11/2008   ENDOMETRIOSIS 12/11/2008   IRRITABLE BOWEL SYNDROME, HX OF 12/11/2008    PCP: Oliver Pila FNP   REFERRING PROVIDER: Dr Vanetta Mulders   REFERRING  DIAG:  Diagnosis  M75.81 (ICD-10-CM) - Tendinitis of right rotator cuff  RTC repair   THERAPY DIAG:  Muscle weakness (generalized)  Chronic right shoulder pain  Stiffness of right shoulder, not elsewhere classified  Rationale for Evaluation and Treatment: Rehabilitation  ONSET DATE: 03/03/2022  Days since surgery: 50   SUBJECTIVE:  SUBJECTIVE STATEMENT: Pt reports improvement in pain level and use of RUE. "I was able to put my necklace on today. I can also shampoo my hair easier."   PERTINENT HISTORY: Ocular Migranes  PAIN:  Are you having pain? Yes: NPRS scale: 3-4/10 still the same  Pain location: right anterior shoulder  Pain description: right shoulder pain  Aggravating factors: use of the shoulder/ sleeping  Relieving factors: rest, tylenol   PRECAUTIONS: Shoulder follow protocol   WEIGHT BEARING RESTRICTIONS: Yes NWB   FALLS:  Has patient fallen in last 6 months? No  LIVING ENVIRONMENT:  OCCUPATION: Stay at home mom.   Hobbies: work around [employer] doing house rehab, cooking.  PLOF: Independent  PATIENT GOALS:    NEXT MD VISIT:   OBJECTIVE:   DIAGNOSTIC FINDINGS:  Nothing post op PATIENT SURVEYS:  FOTO    COGNITION: Overall cognitive status: Within functional limits for tasks assessed     SENSATION: A little numbness in the hand but it has improved   POSTURE: Good.   UPPER EXTREMITY ROM:   Passive ROM Right eval Left eval  Shoulder flexion 75 No limit  Shoulder extension    Shoulder abduction    Shoulder adduction    Shoulder internal rotation 45 No limit      Shoulder external rotation 5 No limit   Elbow flexion    Elbow extension    Wrist flexion    Wrist extension    Wrist ulnar deviation    Wrist radial deviation    Wrist pronation     Wrist supination    (Blank rows = not tested)  UPPER EXTREMITY MMT:  MMT Right eval Left eval  Shoulder flexion    Shoulder extension    Shoulder abduction    Shoulder adduction    Shoulder internal rotation    Shoulder external rotation    Middle trapezius    Lower trapezius    Elbow flexion    Elbow extension    Wrist flexion    Wrist extension    Wrist ulnar deviation    Wrist radial deviation    Wrist pronation    Wrist supination    Grip strength (lbs)    (Blank rows = not tested) not tested at this time secondary to recent surgery    PALPATION:  No unexpected tenderness to palpation    TODAY'S TREATMENT:                                                                                                                                          DATE:  2/16:  Manual- Shoulder PROM, LAD, STM prox biceps  -Well Arm assisted supine flexion- 2x10  Seated ER with cane (instructed for HEP) -pendulums cw and ccw  DATE:  2/13:   Manual- Shoulder PROM, LAD, STM prox biceps  -Well Arm assisted supine flexion- x10 -pulleys 2' flexion -pendulums  cw and ccw  2/9 Manual: PROM into flexion and er per protocol limits; LAD with oscillation during PROM   STM to proximal biceps and lats  ER with wand 10x 5 sec hold.   Pulleys 2 min     2/2 Manual: PROM into flexion and er per protocol limits; LAD with oscillation during PROM   STM to proximal biceps and lats   1/29 Manual: PROM into flexion and er per protocol limits; LAD with oscillation during PROM   STM to proximal biceps and lats  Scap retraction 2x10   Table slide 10x 5 sec hold   ER with wand 10x 5 sec hold.           Pendulums x20ea  Well arm assisted supine flexion x5 (small range, no pain)     PATIENT EDUCATION: Education details: HEP, symptom management, progression of activity.  Importance of sling use. Person educated: Patient Education method: Explanation, Demonstration, Tactile cues,  Verbal cues, and Handouts Education comprehension: verbalized understanding, returned demonstration, verbal cues required, tactile cues required, and needs further education  HOME EXERCISE PROGRAM: Access Code: [email or web address] URL: [email or web address] Date: 03/08/2022 Prepared by: Carolyne Littles  Exercises - Reviewed pendulums - PROM into ER and flexion   ASSESSMENT:  CLINICAL IMPRESSION:  Improved muscle guarding with today's session. She does continue to experience a "catch" in anterior shoulder especially when transferring from supine to seated position. Remains stiff into end ranges, though steadily improving. Instructed pt in seated ER with cane for HEP performance. Measured at 128deg active assisted shoulder flexion in supine. Spent time answering pt questions regarding healing timeline and HEP performance. Will continue to progress with protocol as tolerated.    OBJECTIVE IMPAIRMENTS: decreased activity tolerance, decreased mobility, decreased ROM, decreased strength, impaired UE functional use, and pain.   ACTIVITY LIMITATIONS: carrying, lifting, sleeping, bathing, dressing, self feeding, reach over head, and hygiene/grooming  PARTICIPATION LIMITATIONS: meal prep, cleaning, laundry, community activity, occupation, and yard work  PERSONAL FACTORS:none    REHAB POTENTIAL: Excellent  CLINICAL DECISION MAKING: Stable/uncomplicated  EVALUATION COMPLEXITY: Low   GOALS: Goals reviewed with patient? Yes  SHORT TERM GOALS: Target date: 04/06/2022    Patient will increase passive right shoulder flexion to 110 degrees Baseline: Goal status: INITIAL  2.  Patient will increase passive right shoulder external rotation to 30 degrees Baseline:  Goal status: INITIAL  3.  Patient will be independent with basic HEP following protocol suggestions Baseline:  Goal status: INITIAL   LONG TERM GOALS: Target date: 05/03/2022    Patient will reach overhead to cabinet  with 2 pound weight without pain by the end of rehab Baseline:  Goal status: INITIAL  2.  Patient will reach behind her head to do her hair without pain Baseline:  Goal status: INITIAL  3.  Patient will reach behind her back without pain Baseline:  Goal status: INITIAL  PLAN:  PT FREQUENCY: 2x/week  PT DURATION: 8 weeks  PLANNED INTERVENTIONS: Therapeutic exercises, Therapeutic activity, Neuromuscular re-education, Patient/Family education, Self Care, Joint mobilization, Aquatic Therapy, Dry Needling, Cryotherapy, Moist heat, Ultrasound, Ionotophoresis 58m/ml Dexamethasone, and Manual therapy  PLAN FOR NEXT SESSION: Begin with passive range of motion per protocol.   RSherlynn Carbon PTA   04/22/2022, 4:18 PM

## 2022-04-26 ENCOUNTER — Encounter (HOSPITAL_BASED_OUTPATIENT_CLINIC_OR_DEPARTMENT_OTHER): Payer: Self-pay | Admitting: Physical Therapy

## 2022-04-26 ENCOUNTER — Ambulatory Visit (HOSPITAL_BASED_OUTPATIENT_CLINIC_OR_DEPARTMENT_OTHER): Payer: BC Managed Care – PPO | Admitting: Physical Therapy

## 2022-04-26 DIAGNOSIS — M25521 Pain in right elbow: Secondary | ICD-10-CM

## 2022-04-26 DIAGNOSIS — G8929 Other chronic pain: Secondary | ICD-10-CM

## 2022-04-26 DIAGNOSIS — M25611 Stiffness of right shoulder, not elsewhere classified: Secondary | ICD-10-CM

## 2022-04-26 DIAGNOSIS — M25511 Pain in right shoulder: Secondary | ICD-10-CM | POA: Diagnosis not present

## 2022-04-26 DIAGNOSIS — M6281 Muscle weakness (generalized): Secondary | ICD-10-CM

## 2022-04-26 NOTE — Therapy (Signed)
OUTPATIENT PHYSICAL THERAPY SHOULDER EVALUATION   Patient Name: Destiny Gardner MRN: [id number] DOB:1965-04-01, 57 y.o., female Today's Date: 04/27/2022  END OF SESSION:  PT End of Session - 04/26/22 1436     Visit Number 14    Number of Visits 28    Date for PT Re-Evaluation 06/08/22    PT Start Time 1430    PT Stop Time 1512    PT Time Calculation (min) 42 min    Activity Tolerance Patient tolerated treatment well    Behavior During Therapy WFL for tasks assessed/performed               Past Medical History:  Diagnosis Date   Complication of anesthesia    post-op N/V, waking up during rhinoplasty and colonoscopy   Endometriosis    Headache    Hx Occular Migraine   Inflammatory bowel disease    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,now with constipation 2010   PONV (postoperative nausea and vomiting)    Past Surgical History:  Procedure Laterality Date   COLONOSCOPY  05/05/2009   TCS/EGD :noraml stomach,colon,and duodenum   COLONOSCOPY  2020   ESOPHAGOGASTRODUODENOSCOPY  05/05/2009   with TCS   HERNIA REPAIR  2015   Umbilical Hernia   LAPAROTOMY  03/07/1997   endometriosis   MYRINGOTOMY     Tubes,Tympanoplasty   RHINOPLASTY  1989   SHOULDER ARTHROSCOPY WITH ROTATOR CUFF REPAIR AND OPEN BICEPS TENODESIS Right 03/03/2022   Procedure: RIGHT SHOULDER ARTHROSCOPY WITH ROTATOR CUFF REPAIR AND BICEPS TENODESIS;  Surgeon: Huel Cote, MD;  Location: [facility];  Service: Orthopedics;  Laterality: Right;   Tonillectomy  1990   WISDOM TOOTH EXTRACTION     Patient Active Problem List   Diagnosis Date Noted   Tendinitis of right rotator cuff 03/03/2022   Biceps tendinitis of right shoulder 03/03/2022   Cough 05/18/2015   ABDOMINAL PAIN 05/26/2009   CHANGE IN BOWELS 01/27/2009   CONSTIPATION, CHRONIC 12/11/2008   ENDOMETRIOSIS 12/11/2008   IRRITABLE BOWEL SYNDROME, HX OF 12/11/2008    PCP: Onnie Graham FNP   REFERRING PROVIDER: Dr Huel Cote   REFERRING  DIAG:  Diagnosis  M75.81 (ICD-10-CM) - Tendinitis of right rotator cuff  RTC repair   THERAPY DIAG:  Muscle weakness (generalized)  Chronic right shoulder pain  Stiffness of right shoulder, not elsewhere classified  Pain in right elbow  Rationale for Evaluation and Treatment: Rehabilitation  ONSET DATE: 03/03/2022  Days since surgery: 54   SUBJECTIVE:  SUBJECTIVE STATEMENT: Patient reports cramping in her anterior shoulder today.  She is not sure what the mechanism pain is.  Overall she feels like he is doing.  PERTINENT HISTORY: Ocular Migranes  PAIN:  Are you having pain? Yes: NPRS scale: Can be 5 out of 10 still the same  Pain location: right anterior shoulder  Pain description: Cramping Aggravating factors: use of the shoulder/ sleeping  Relieving factors: rest, tylenol   PRECAUTIONS: Shoulder follow protocol   WEIGHT BEARING RESTRICTIONS: Yes NWB   FALLS:  Has patient fallen in last 6 months? No  LIVING ENVIRONMENT:  OCCUPATION: Stay at home mom.   Hobbies: work around [employer] doing house rehab, cooking.  PLOF: Independent  PATIENT GOALS:    NEXT MD VISIT:   OBJECTIVE:   DIAGNOSTIC FINDINGS:  Nothing post op PATIENT SURVEYS:  FOTO    COGNITION: Overall cognitive status: Within functional limits for tasks assessed     SENSATION: A little numbness in the hand but it has improved   POSTURE: Good.   UPPER EXTREMITY ROM:   Passive ROM Right 2/20 Left eval  Shoulder flexion 125 No limit  Shoulder extension    Shoulder abduction    Shoulder adduction    Shoulder internal rotation 60 No limit      Shoulder external rotation 25 No limit   Elbow flexion    Elbow extension    Wrist flexion    Wrist extension    Wrist ulnar deviation    Wrist radial  deviation    Wrist pronation    Wrist supination    (Blank rows = not tested)  UPPER EXTREMITY MMT:  MMT Right eval Left eval  Shoulder flexion    Shoulder extension    Shoulder abduction    Shoulder adduction    Shoulder internal rotation    Shoulder external rotation    Middle trapezius    Lower trapezius    Elbow flexion    Elbow extension    Wrist flexion    Wrist extension    Wrist ulnar deviation    Wrist radial deviation    Wrist pronation    Wrist supination    Grip strength (lbs)    (Blank rows = not tested) not tested at this time secondary to recent surgery    PALPATION:  No unexpected tenderness to palpation    TODAY'S TREATMENT:                                                                                                                                          DATE:   2/20 Manual- Shoulder PROM into all ranges , LAD, STM to upper trap   Isomerics ER, IR , and flexion x10   Wand press 1 lb x5          2/16:  Manual- Shoulder PROM, LAD, STM prox biceps  -Well Arm  assisted supine flexion- 2x10  Seated ER with cane (instructed for HEP) -pendulums cw and ccw  DATE:  2/13:   Manual- Shoulder PROM, LAD, STM prox biceps  -Well Arm assisted supine flexion- x10 -pulleys 2' flexion -pendulums cw and ccw  2/9 Manual: PROM into flexion and er per protocol limits; LAD with oscillation during PROM   STM to proximal biceps and lats  ER with wand 10x 5 sec hold.   Pulleys 2 min     2/2 Manual: PROM into flexion and er per protocol limits; LAD with oscillation during PROM   STM to proximal biceps and lats   1/29 Manual: PROM into flexion and er per protocol limits; LAD with oscillation during PROM   STM to proximal biceps and lats  Scap retraction 2x10   Table slide 10x 5 sec hold   ER with wand 10x 5 sec hold.           Pendulums x20ea  Well arm assisted supine flexion x5 (small range, no pain)     PATIENT  EDUCATION: Education details: HEP, symptom management, progression of activity.  Importance of sling use. Person educated: Patient Education method: Explanation, Demonstration, Tactile cues, Verbal cues, and Handouts Education comprehension: verbalized understanding, returned demonstration, verbal cues required, tactile cues required, and needs further education  HOME EXERCISE PROGRAM: Access Code: [email or web address] URL: [email or web address] Date: 03/08/2022 Prepared by: Lorayne Bender  Exercises - Reviewed pendulums - PROM into ER and flexion   ASSESSMENT:  CLINICAL IMPRESSION: The patient is making good progress.  Her shoulder flexion and external rotation are progressing very well compared to the beginning of her episode.  She continues to have some anterior shoulder pain.  She is advised this is normal at this stage of the process.  We had a wand press today and isometrics to her plan.  She had no significant increase in pain.  She continued to have the pain that she came in with.  She was advised if she is having muscle spasms she can consider heat at this time.  Therapy will continue to progress per protocol.   OBJECTIVE IMPAIRMENTS: decreased activity tolerance, decreased mobility, decreased ROM, decreased strength, impaired UE functional use, and pain.   ACTIVITY LIMITATIONS: carrying, lifting, sleeping, bathing, dressing, self feeding, reach over head, and hygiene/grooming  PARTICIPATION LIMITATIONS: meal prep, cleaning, laundry, community activity, occupation, and yard work  PERSONAL FACTORS:none    REHAB POTENTIAL: Excellent  CLINICAL DECISION MAKING: Stable/uncomplicated  EVALUATION COMPLEXITY: Low   GOALS: Goals reviewed with patient? Yes  SHORT TERM GOALS: Target date: 04/06/2022    Patient will increase passive right shoulder flexion to 110 degrees Baseline: Goal status: INITIAL  2.  Patient will increase passive right shoulder external rotation to  30 degrees Baseline:  Goal status: INITIAL  3.  Patient will be independent with basic HEP following protocol suggestions Baseline:  Goal status: INITIAL   LONG TERM GOALS: Target date: 05/03/2022    Patient will reach overhead to cabinet with 2 pound weight without pain by the end of rehab Baseline:  Goal status: INITIAL  2.  Patient will reach behind her head to do her hair without pain Baseline:  Goal status: INITIAL  3.  Patient will reach behind her back without pain Baseline:  Goal status: INITIAL  PLAN:  PT FREQUENCY: 2x/week  PT DURATION: 8 weeks  PLANNED INTERVENTIONS: Therapeutic exercises, Therapeutic activity, Neuromuscular re-education, Patient/Family education, Self Care, Joint mobilization, Aquatic Therapy, Dry Needling, Cryotherapy, Moist heat, Ultrasound, Ionotophoresis  4mg /ml Dexamethasone, and Manual therapy  PLAN FOR NEXT SESSION: Begin with passive range of motion per protocol.   Riki Altes, PTA   04/27/2022, 7:12 AM

## 2022-04-28 ENCOUNTER — Ambulatory Visit (INDEPENDENT_AMBULATORY_CARE_PROVIDER_SITE_OTHER): Payer: BC Managed Care – PPO | Admitting: Orthopaedic Surgery

## 2022-04-28 DIAGNOSIS — M7581 Other shoulder lesions, right shoulder: Secondary | ICD-10-CM

## 2022-04-28 NOTE — Progress Notes (Signed)
Post Operative Evaluation    Procedure/Date of Surgery: Right shoulder rotator cuff repair biceps tenodesis 03/03/22  Interval History:    Destiny Gardner presents today for follow-up 8 weeks status post above procedure.  Overall she is dramatically improving.  She is doing quite well after Medrol Dosepak to decrease inflammation.  She has been progressing quite well with physical therapy since then.  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,now with constipation 2010   PONV (postoperative nausea and vomiting)    Past Surgical History:  Procedure Laterality Date   COLONOSCOPY  05/05/2009   TCS/EGD :noraml stomach,colon,and duodenum   COLONOSCOPY  2020   ESOPHAGOGASTRODUODENOSCOPY  05/05/2009   with TCS   HERNIA REPAIR  123456   Umbilical Hernia   LAPAROTOMY  03/07/1997   endometriosis   MYRINGOTOMY     Tubes,Tympanoplasty   RHINOPLASTY  1989   SHOULDER ARTHROSCOPY WITH ROTATOR CUFF REPAIR AND OPEN BICEPS TENODESIS Right 03/03/2022   Procedure: RIGHT SHOULDER ARTHROSCOPY WITH ROTATOR CUFF REPAIR AND BICEPS TENODESIS;  Surgeon: Vanetta Mulders, MD;  Location: [facility];  Service: Orthopedics;  Laterality: Right;   Tonillectomy  1990   WISDOM TOOTH EXTRACTION     Social History   Socioeconomic History   Marital status: Married    Spouse name: Not on file   Number of children: Not on file   Years of education: Not on file   Highest education level: Not on file  Occupational History   Not on file  Tobacco Use   Smoking status: Never   Smokeless tobacco: Never  Substance and Sexual Activity   Alcohol use: Yes    Alcohol/week: 0.0 standard drinks of alcohol    Comment: social   Drug use: No    Sexual activity: Yes  Other Topics Concern   Not on file  Social History Narrative   Not on file   Social Determinants of Health   Financial Resource Strain: Not on file  Food Insecurity: Not on file  Transportation Needs: Not on file  Physical Activity: Not on file  Stress: Not on file  Social Connections: Not on file   Family History  Problem Relation Age of Onset   COPD Mother        never smoker   Prostate cancer Father    Allergies  Allergen Reactions   Codeine Nausea And Vomiting   Ketorolac Tromethamine    Penicillins Hives   Chlorhexidine Rash   Current Outpatient Medications  Medication Sig Dispense Refill   Ascorbic Acid (VITAMIN C PO) Take 1 tablet by mouth daily.     aspirin EC 325 MG tablet Take 1 tablet (325 mg total) by mouth daily. 30 tablet 0   methylPREDNISolone (MEDROL DOSEPAK) 4 MG TBPK tablet Take per packet instructions 21 each 0   oxyCODONE (OXY IR/ROXICODONE) 5 MG immediate release tablet Take 1 tablet (5 mg total) by mouth every 4 (four) hours as needed (severe pain). 10 tablet 0   No current facility-administered medications for this visit.   No results found.  Review  of Systems:   A ROS was performed including pertinent positives and negatives as documented in the HPI.   Musculoskeletal Exam:      Right shoulder incisions are well-appearing without erythema or drainage.  In the supine position she is able to forward elevate to approximately 90 degrees with external rotation at the side 20 degrees.  These are both somewhat painful.  Active forward elevation is to only 40 degrees with pain.  Internal rotation deferred today.   Imaging:      I personally reviewed and interpreted the radiographs.   Assessment:   8 weeks status post right shoulder rotator cuff repair.  She is much better today after Medrol Dosepak.  I would like her to continue to work on active and active assisted range of motion.  Initially she will do this with  physical therapy and then she may supplement this with the pulley as she feels more comfortable.  I would like to see her back in 2 weeks for reassessment Plan :    -Return to clinic 2 weeks for reassessment      I personally saw and evaluated the patient, and participated in the management and treatment plan.  Vanetta Mulders, MD Attending Physician, Orthopedic Surgery  This document was dictated using Dragon voice recognition software. A reasonable attempt at proof reading has been made to minimize errors.

## 2022-04-29 ENCOUNTER — Ambulatory Visit (HOSPITAL_BASED_OUTPATIENT_CLINIC_OR_DEPARTMENT_OTHER): Payer: BC Managed Care – PPO | Admitting: Physical Therapy

## 2022-04-29 DIAGNOSIS — M25611 Stiffness of right shoulder, not elsewhere classified: Secondary | ICD-10-CM

## 2022-04-29 DIAGNOSIS — M25511 Pain in right shoulder: Secondary | ICD-10-CM | POA: Diagnosis not present

## 2022-04-29 DIAGNOSIS — G8929 Other chronic pain: Secondary | ICD-10-CM

## 2022-04-29 DIAGNOSIS — M6281 Muscle weakness (generalized): Secondary | ICD-10-CM

## 2022-04-29 NOTE — Therapy (Signed)
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,now with constipation 2010   PONV (postoperative nausea and vomiting)    Past Surgical History:  Procedure Laterality Date   COLONOSCOPY  05/05/2009   TCS/EGD :noraml stomach,colon,and duodenum   COLONOSCOPY  2020   ESOPHAGOGASTRODUODENOSCOPY  05/05/2009   with TCS   HERNIA REPAIR  123456   Umbilical Hernia   LAPAROTOMY  03/07/1997   endometriosis   MYRINGOTOMY     Tubes,Tympanoplasty   RHINOPLASTY  1989   SHOULDER ARTHROSCOPY WITH ROTATOR CUFF REPAIR AND OPEN BICEPS TENODESIS Right 03/03/2022   Procedure: RIGHT SHOULDER ARTHROSCOPY WITH ROTATOR CUFF REPAIR AND BICEPS TENODESIS;  Surgeon: Vanetta Mulders, MD;  Location: [facility];  Service: Orthopedics;  Laterality: Right;   [place] EXTRACTION     Patient Active Problem List   Diagnosis Date Noted   Tendinitis of right rotator cuff 03/03/2022   Biceps tendinitis of right shoulder 03/03/2022   Cough 05/18/2015   ABDOMINAL PAIN 05/26/2009   CHANGE IN BOWELS 01/27/2009   CONSTIPATION, CHRONIC 12/11/2008   ENDOMETRIOSIS 12/11/2008   IRRITABLE BOWEL SYNDROME, HX OF 12/11/2008    PCP: Oliver Pila FNP   REFERRING PROVIDER: Dr Vanetta Mulders   REFERRING DIAG:  Diagnosis  M75.81 (ICD-10-CM) - Tendinitis of right rotator cuff  RTC repair   THERAPY DIAG:  No diagnosis found.  Rationale for Evaluation and Treatment: Rehabilitation  ONSET DATE: 03/03/2022  Days since surgery: 57   SUBJECTIVE:                                                                                                                                                                                       SUBJECTIVE STATEMENT: Patient reports cramping in her anterior shoulder today.  She is not sure what the mechanism pain is.  Overall she feels like he is doing.  PERTINENT HISTORY: Ocular Migranes  PAIN:  Are you having pain? Yes: NPRS scale: Can be 5 out of 10 still the same  Pain location: right anterior shoulder  Pain description: Cramping Aggravating factors: use of the shoulder/ sleeping  Relieving factors: rest, tylenol   PRECAUTIONS: Shoulder follow protocol   WEIGHT BEARING RESTRICTIONS: Yes NWB   FALLS:  Has patient  fallen in last 6 months? No  LIVING ENVIRONMENT:  OCCUPATION: Stay at home mom.   Hobbies: work around [employer] doing house rehab, cooking.  PLOF: Independent  PATIENT GOALS:    NEXT MD VISIT:   OBJECTIVE:   DIAGNOSTIC FINDINGS:  Nothing post op PATIENT SURVEYS:  FOTO    COGNITION: Overall cognitive status: Within functional limits for tasks assessed     SENSATION: A little numbness in the hand but it has improved   POSTURE: Good.   UPPER EXTREMITY ROM:   Passive ROM Right 2/20 Left eval  Shoulder flexion 125 No limit  Shoulder extension    Shoulder abduction    Shoulder adduction    Shoulder internal rotation 60 No limit      Shoulder external rotation 25 No limit   Elbow flexion    Elbow extension    Wrist flexion    Wrist extension    Wrist ulnar deviation    Wrist radial deviation    Wrist pronation    Wrist supination    (Blank rows = not tested)  UPPER EXTREMITY MMT:  MMT Right eval Left eval  Shoulder flexion    Shoulder extension    Shoulder abduction    Shoulder adduction    Shoulder internal rotation    Shoulder external rotation    Middle trapezius    Lower trapezius    Elbow flexion    Elbow extension    Wrist flexion    Wrist extension    Wrist ulnar deviation    Wrist radial deviation     Wrist pronation    Wrist supination    Grip strength (lbs)    (Blank rows = not tested) not tested at this time secondary to recent surgery    PALPATION:  No unexpected tenderness to palpation    TODAY'S TREATMENT:                                                                                                                                          DATE:  2/23  Wand press to flexion x10 1b Waist to neutral flexion x5 with 1 lb wand  Active ER IR x10  Scap retraction 2x10 yellow   Manual- Shoulder PROM into all ranges , LAD, STM to upper trap    2/20 Manual- Shoulder PROM into all ranges , LAD, STM to upper trap   Isomerics ER, IR , and flexion x10   Wand press 1 lb x5          2/16:  Manual- Shoulder PROM, LAD, STM prox biceps  -Well Arm assisted supine flexion- 2x10  Seated ER with cane (instructed for HEP) -pendulums cw and ccw  DATE:  2/23 Manual- Shoulder PROM, LAD, STM prox biceps  2/13:   Manual- Shoulder PROM, LAD, STM prox biceps  -Well Arm assisted supine flexion- x10 -pulleys 2' flexion -pendulums cw  and ccw  2/9 Manual: PROM into flexion and er per protocol limits; LAD with oscillation during PROM   STM to proximal biceps and lats  ER with wand 10x 5 sec hold.   Pulleys 2 min     2/2 Manual: PROM into flexion and er per protocol limits; LAD with oscillation during PROM   STM to proximal biceps and lats   1/29 Manual: PROM into flexion and er per protocol limits; LAD with oscillation during PROM   STM to proximal biceps and lats  Scap retraction 2x10   Table slide 10x 5 sec hold   ER with wand 10x 5 sec hold.           Pendulums x20ea  Well arm assisted supine flexion x5 (small range, no pain)     PATIENT EDUCATION: Education details: HEP, symptom management, progression of activity.  Importance of sling use. Person educated: Patient Education method: Explanation, Demonstration, Tactile cues, Verbal cues, and  Handouts Education comprehension: verbalized understanding, returned demonstration, verbal cues required, tactile cues required, and needs further education  HOME EXERCISE PROGRAM: Access Code: [email or web address] URL: [email or web address] Date: 03/08/2022 Prepared by: Carolyne Littles  Exercises - Reviewed pendulums - PROM into ER and flexion   ASSESSMENT:  CLINICAL IMPRESSION: The patient is making excellent progress. Her ROM is progressing as tolerated. We gave her light scapular strengthening exercises to being today We also had her start with light active ER IR. She is hesitant with some of the exercises but does better as she does the reps. We will continue to progress as tolerated.   OBJECTIVE IMPAIRMENTS: decreased activity tolerance, decreased mobility, decreased ROM, decreased strength, impaired UE functional use, and pain.   ACTIVITY LIMITATIONS: carrying, lifting, sleeping, bathing, dressing, self feeding, reach over head, and hygiene/grooming  PARTICIPATION LIMITATIONS: meal prep, cleaning, laundry, community activity, occupation, and yard work  PERSONAL FACTORS:none    REHAB POTENTIAL: Excellent  CLINICAL DECISION MAKING: Stable/uncomplicated  EVALUATION COMPLEXITY: Low   GOALS: Goals reviewed with patient? Yes  SHORT TERM GOALS: Target date: 04/06/2022    Patient will increase passive right shoulder flexion to 110 degrees Baseline: Goal status: INITIAL  2.  Patient will increase passive right shoulder external rotation to 30 degrees Baseline:  Goal status: INITIAL  3.  Patient will be independent with basic HEP following protocol suggestions Baseline:  Goal status: INITIAL   LONG TERM GOALS: Target date: 05/03/2022    Patient will reach overhead to cabinet with 2 pound weight without pain by the end of rehab Baseline:  Goal status: INITIAL  2.  Patient will reach behind her head to do her hair without pain Baseline:  Goal status:  INITIAL  3.  Patient will reach behind her back without pain Baseline:  Goal status: INITIAL  PLAN:  PT FREQUENCY: 2x/week  PT DURATION: 8 weeks  PLANNED INTERVENTIONS: Therapeutic exercises, Therapeutic activity, Neuromuscular re-education, Patient/Family education, Self Care, Joint mobilization, Aquatic Therapy, Dry Needling, Cryotherapy, Moist heat, Ultrasound, Ionotophoresis '4mg'$ /ml Dexamethasone, and Manual therapy  PLAN FOR NEXT SESSION: Begin with passive range of motion per protocol.     Carolyne Littles PT DPT  04/29/2022, 10:41 AM

## 2022-05-03 ENCOUNTER — Encounter (HOSPITAL_BASED_OUTPATIENT_CLINIC_OR_DEPARTMENT_OTHER): Payer: Self-pay

## 2022-05-03 ENCOUNTER — Ambulatory Visit (HOSPITAL_BASED_OUTPATIENT_CLINIC_OR_DEPARTMENT_OTHER): Payer: BC Managed Care – PPO

## 2022-05-03 DIAGNOSIS — G8929 Other chronic pain: Secondary | ICD-10-CM

## 2022-05-03 DIAGNOSIS — M25611 Stiffness of right shoulder, not elsewhere classified: Secondary | ICD-10-CM

## 2022-05-03 DIAGNOSIS — M25511 Pain in right shoulder: Secondary | ICD-10-CM | POA: Diagnosis not present

## 2022-05-03 DIAGNOSIS — M6281 Muscle weakness (generalized): Secondary | ICD-10-CM

## 2022-05-03 NOTE — Therapy (Signed)
OUTPATIENT PHYSICAL THERAPY SHOULDER EVALUATION   Patient Name: Destiny Gardner MRN: [id number] DOB:October 12, 1965, 57 y.o., female Today's Date: 05/03/2022  END OF SESSION:  PT End of Session - 05/03/22 1043     Visit Number 16    Number of Visits 28    Date for PT Re-Evaluation 06/08/22    PT Start Time 1018    PT Stop Time 1103    PT Time Calculation (min) 45 min    Activity Tolerance Patient tolerated treatment well    Behavior During Therapy [facility] for tasks assessed/performed                Past Medical History:  Diagnosis Date   Complication of anesthesia    post-op N/V, waking up during rhinoplasty and colonoscopy   Endometriosis    Headache    Hx Occular Migraine   Inflammatory bowel disease    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,now with constipation 2010   PONV (postoperative nausea and vomiting)    Past Surgical History:  Procedure Laterality Date   COLONOSCOPY  05/05/2009   TCS/EGD :noraml stomach,colon,and duodenum   COLONOSCOPY  2020   ESOPHAGOGASTRODUODENOSCOPY  05/05/2009   with TCS   HERNIA REPAIR  123456   Umbilical Hernia   LAPAROTOMY  03/07/1997   endometriosis   MYRINGOTOMY     Tubes,Tympanoplasty   RHINOPLASTY  1989   SHOULDER ARTHROSCOPY WITH ROTATOR CUFF REPAIR AND OPEN BICEPS TENODESIS Right 03/03/2022   Procedure: RIGHT SHOULDER ARTHROSCOPY WITH ROTATOR CUFF REPAIR AND BICEPS TENODESIS;  Surgeon: Vanetta Mulders, MD;  Location: [place];  Service: Orthopedics;  Laterality: Right;   [place] EXTRACTION     Patient Active Problem List   Diagnosis Date Noted   Tendinitis of right rotator cuff 03/03/2022   Biceps tendinitis of right shoulder 03/03/2022   Cough 05/18/2015   ABDOMINAL PAIN 05/26/2009   CHANGE IN BOWELS 01/27/2009   CONSTIPATION, CHRONIC 12/11/2008   ENDOMETRIOSIS 12/11/2008   IRRITABLE BOWEL SYNDROME, HX OF 12/11/2008    PCP: Oliver Pila FNP   REFERRING PROVIDER: Dr Vanetta Mulders   REFERRING  DIAG:  Diagnosis  M75.81 (ICD-10-CM) - Tendinitis of right rotator cuff  RTC repair   THERAPY DIAG:  Muscle weakness (generalized)  Chronic right shoulder pain  Stiffness of right shoulder, not elsewhere classified  Rationale for Evaluation and Treatment: Rehabilitation  ONSET DATE: 03/03/2022  Days since surgery: 61   SUBJECTIVE:  SUBJECTIVE STATEMENT: Patient reports mild stiffness at entry because she hasn't been awake for too long. Able to sleep for longer without pain and can reach her head better to wash her hair.  PERTINENT HISTORY: Ocular Migranes  PAIN:  Are you having pain? Yes: NPRS scale: Can be 5 out of 10 still the same  Pain location: right anterior shoulder  Pain description: Cramping Aggravating factors: use of the shoulder/ sleeping  Relieving factors: rest, tylenol   PRECAUTIONS: Shoulder follow protocol   WEIGHT BEARING RESTRICTIONS: Yes NWB   FALLS:  Has patient fallen in last 6 months? No  LIVING ENVIRONMENT:  OCCUPATION: Stay at home mom.   Hobbies: work around [employer] doing house rehab, cooking.  PLOF: Independent  PATIENT GOALS:    NEXT MD VISIT:   OBJECTIVE:   DIAGNOSTIC FINDINGS:  Nothing post op PATIENT SURVEYS:  FOTO    COGNITION: Overall cognitive status: Within functional limits for tasks assessed     SENSATION: A little numbness in the hand but it has improved   POSTURE: Good.   UPPER EXTREMITY ROM:   Passive ROM Right 2/20 Left eval  Shoulder flexion 125 No limit  Shoulder extension    Shoulder abduction    Shoulder adduction    Shoulder internal rotation 60 No limit      Shoulder external rotation 25 No limit   Elbow flexion    Elbow extension    Wrist flexion    Wrist extension    Wrist ulnar deviation    Wrist  radial deviation    Wrist pronation    Wrist supination    (Blank rows = not tested)  UPPER EXTREMITY MMT:  MMT Right eval Left eval  Shoulder flexion    Shoulder extension    Shoulder abduction    Shoulder adduction    Shoulder internal rotation    Shoulder external rotation    Middle trapezius    Lower trapezius    Elbow flexion    Elbow extension    Wrist flexion    Wrist extension    Wrist ulnar deviation    Wrist radial deviation    Wrist pronation    Wrist supination    Grip strength (lbs)    (Blank rows = not tested) not tested at this time secondary to recent surgery    PALPATION:  No unexpected tenderness to palpation    TODAY'S TREATMENT:                                                                                                                                          DATE:  2/27  Wand press to flexion x10 1b Waist to neutral flexion 2x10 with 1 lb wand  Supine active shoulder flexion 2x5 (required assist at first)   Manual- Shoulder PROM into all ranges , LAD, STM to biceps   2/23  Wand press  to flexion x10 1b Waist to neutral flexion x5 with 1 lb wand  Active ER IR x10  Scap retraction 2x10 yellow   Manual- Shoulder PROM into all ranges , LAD, STM to upper trap    2/20 Manual- Shoulder PROM into all ranges , LAD, STM to upper trap   Isomerics ER, IR , and flexion x10   Wand press 1 lb x5          2/16:  Manual- Shoulder PROM, LAD, STM prox biceps  -Well Arm assisted supine flexion- 2x10  Seated ER with cane (instructed for HEP) -pendulums cw and ccw  DATE:  2/23 Manual- Shoulder PROM, LAD, STM prox biceps  2/13:   Manual- Shoulder PROM, LAD, STM prox biceps  -Well Arm assisted supine flexion- x10 -pulleys 2' flexion -pendulums cw and ccw   PATIENT EDUCATION: Education details: HEP, symptom management, progression of activity.  Importance of sling use. Person educated: Patient Education method:  Explanation, Demonstration, Tactile cues, Verbal cues, and Handouts Education comprehension: verbalized understanding, returned demonstration, verbal cues required, tactile cues required, and needs further education  HOME EXERCISE PROGRAM: Access Code: [email or web address] URL: [email or web address] Date: 03/08/2022 Prepared by: Carolyne Littles  Exercises - Reviewed pendulums - PROM into ER and flexion   ASSESSMENT:  CLINICAL IMPRESSION: Pt able to complete active supine flexion today, requiring assist from clinician initially, but then able to do independently. No discomfort with this, only shakiness due to poor strength and endurance. Educated pt on adding this to HEP safely and to remain within pain limitations. Again worked on PROM all planes to address continued restrictions here. She does continue to complain of tightness/pinching in anterior shoulder, so worked on [employer] to proximal biceps to improve this.   OBJECTIVE IMPAIRMENTS: decreased activity tolerance, decreased mobility, decreased ROM, decreased strength, impaired UE functional use, and pain.   ACTIVITY LIMITATIONS: carrying, lifting, sleeping, bathing, dressing, self feeding, reach over head, and hygiene/grooming  PARTICIPATION LIMITATIONS: meal prep, cleaning, laundry, community activity, occupation, and yard work  PERSONAL FACTORS:none    REHAB POTENTIAL: Excellent  CLINICAL DECISION MAKING: Stable/uncomplicated  EVALUATION COMPLEXITY: Low   GOALS: Goals reviewed with patient? Yes  SHORT TERM GOALS: Target date: 04/06/2022    Patient will increase passive right shoulder flexion to 110 degrees Baseline: Goal status: INITIAL  2.  Patient will increase passive right shoulder external rotation to 30 degrees Baseline:  Goal status: INITIAL  3.  Patient will be independent with basic HEP following protocol suggestions Baseline:  Goal status: INITIAL   LONG TERM GOALS: Target date:  05/03/2022    Patient will reach overhead to cabinet with 2 pound weight without pain by the end of rehab Baseline:  Goal status: INITIAL  2.  Patient will reach behind her head to do her hair without pain Baseline:  Goal status: INITIAL  3.  Patient will reach behind her back without pain Baseline:  Goal status: INITIAL  PLAN:  PT FREQUENCY: 2x/week  PT DURATION: 8 weeks  PLANNED INTERVENTIONS: Therapeutic exercises, Therapeutic activity, Neuromuscular re-education, Patient/Family education, Self Care, Joint mobilization, Aquatic Therapy, Dry Needling, Cryotherapy, Moist heat, Ultrasound, Ionotophoresis '4mg'$ /ml Dexamethasone, and Manual therapy  PLAN FOR NEXT SESSION: Begin with passive range of motion per protocol.     Sherlynn Carbon, PTA  05/03/2022, 12:09 PM

## 2022-05-06 ENCOUNTER — Encounter (HOSPITAL_BASED_OUTPATIENT_CLINIC_OR_DEPARTMENT_OTHER): Payer: Self-pay

## 2022-05-06 ENCOUNTER — Ambulatory Visit (HOSPITAL_BASED_OUTPATIENT_CLINIC_OR_DEPARTMENT_OTHER): Payer: BC Managed Care – PPO | Attending: Family Medicine

## 2022-05-06 ENCOUNTER — Encounter (HOSPITAL_BASED_OUTPATIENT_CLINIC_OR_DEPARTMENT_OTHER): Payer: BC Managed Care – PPO | Admitting: Physical Therapy

## 2022-05-06 DIAGNOSIS — M6281 Muscle weakness (generalized): Secondary | ICD-10-CM | POA: Insufficient documentation

## 2022-05-06 DIAGNOSIS — M25511 Pain in right shoulder: Secondary | ICD-10-CM | POA: Diagnosis present

## 2022-05-06 DIAGNOSIS — G8929 Other chronic pain: Secondary | ICD-10-CM | POA: Diagnosis present

## 2022-05-06 DIAGNOSIS — M25611 Stiffness of right shoulder, not elsewhere classified: Secondary | ICD-10-CM | POA: Insufficient documentation

## 2022-05-06 DIAGNOSIS — M25521 Pain in right elbow: Secondary | ICD-10-CM | POA: Insufficient documentation

## 2022-05-06 NOTE — Therapy (Signed)
OUTPATIENT PHYSICAL THERAPY SHOULDER EVALUATION   Patient Name: Destiny Gardner MRN: [id number] DOB:September 06, 1965, 57 y.o., female Today's Date: 05/06/2022  END OF SESSION:  PT End of Session - 05/06/22 1153     Visit Number 17    Number of Visits 28    Date for PT Re-Evaluation 06/08/22    PT Start Time 1100    PT Stop Time [id number]    PT Time Calculation (min) 58 min    Activity Tolerance Patient tolerated treatment well    Behavior During Therapy WFL for tasks assessed/performed                 Past Medical History:  Diagnosis Date   Complication of anesthesia    post-op N/V, waking up during rhinoplasty and colonoscopy   Endometriosis    Headache    Hx Occular Migraine   Inflammatory bowel disease    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,now with constipation 2010   PONV (postoperative nausea and vomiting)    Past Surgical History:  Procedure Laterality Date   COLONOSCOPY  05/05/2009   TCS/EGD :noraml stomach,colon,and duodenum   COLONOSCOPY  2020   ESOPHAGOGASTRODUODENOSCOPY  05/05/2009   with TCS   HERNIA REPAIR  123456   Umbilical Hernia   LAPAROTOMY  03/07/1997   endometriosis   MYRINGOTOMY     Tubes,Tympanoplasty   RHINOPLASTY  1989   SHOULDER ARTHROSCOPY WITH ROTATOR CUFF REPAIR AND OPEN BICEPS TENODESIS Right 03/03/2022   Procedure: RIGHT SHOULDER ARTHROSCOPY WITH ROTATOR CUFF REPAIR AND BICEPS TENODESIS;  Surgeon: Vanetta Mulders, MD;  Location: [place];  Service: Orthopedics;  Laterality: Right;   [place] EXTRACTION     Patient Active Problem List   Diagnosis Date Noted   Tendinitis of right rotator cuff 03/03/2022   Biceps tendinitis of right shoulder 03/03/2022   Cough 05/18/2015   ABDOMINAL PAIN 05/26/2009   CHANGE IN BOWELS 01/27/2009   CONSTIPATION, CHRONIC 12/11/2008   ENDOMETRIOSIS 12/11/2008   IRRITABLE BOWEL SYNDROME, HX OF 12/11/2008    PCP: Oliver Pila FNP   REFERRING PROVIDER: Dr Vanetta Mulders    REFERRING DIAG:  Diagnosis  M75.81 (ICD-10-CM) - Tendinitis of right rotator cuff  RTC repair   THERAPY DIAG:  Muscle weakness (generalized)  Chronic right shoulder pain  Pain in right elbow  Stiffness of right shoulder, not elsewhere classified  Rationale for Evaluation and Treatment: Rehabilitation  ONSET DATE: 03/03/2022  Days since surgery: 64   SUBJECTIVE:  SUBJECTIVE STATEMENT: Pt reports mild stiffness at entry. Has been increasing her exercise frequency with HEP and working on active flexion in supine.  PERTINENT HISTORY: Ocular Migranes  PAIN:  Are you having pain? Yes: NPRS scale: Can be 5 out of 10 still the same  Pain location: right anterior shoulder  Pain description: Cramping Aggravating factors: use of the shoulder/ sleeping  Relieving factors: rest, tylenol   PRECAUTIONS: Shoulder follow protocol   WEIGHT BEARING RESTRICTIONS: Yes NWB   FALLS:  Has patient fallen in last 6 months? No  LIVING ENVIRONMENT:  OCCUPATION: Stay at home mom.   Hobbies: work around [employer] doing house rehab, cooking.  PLOF: Independent  PATIENT GOALS:    NEXT MD VISIT:   OBJECTIVE:   DIAGNOSTIC FINDINGS:  Nothing post op PATIENT SURVEYS:  FOTO    COGNITION: Overall cognitive status: Within functional limits for tasks assessed     SENSATION: A little numbness in the hand but it has improved   POSTURE: Good.   UPPER EXTREMITY ROM:   Passive ROM Right 2/20 Left eval  Shoulder flexion 125 No limit  Shoulder extension    Shoulder abduction    Shoulder adduction    Shoulder internal rotation 60 No limit      Shoulder external rotation 25 No limit   Elbow flexion    Elbow extension    Wrist flexion    Wrist extension    Wrist ulnar deviation    Wrist radial  deviation    Wrist pronation    Wrist supination    (Blank rows = not tested)  UPPER EXTREMITY MMT:  MMT Right eval Left eval  Shoulder flexion    Shoulder extension    Shoulder abduction    Shoulder adduction    Shoulder internal rotation    Shoulder external rotation    Middle trapezius    Lower trapezius    Elbow flexion    Elbow extension    Wrist flexion    Wrist extension    Wrist ulnar deviation    Wrist radial deviation    Wrist pronation    Wrist supination    Grip strength (lbs)    (Blank rows = not tested) not tested at this time secondary to recent surgery    PALPATION:  No unexpected tenderness to palpation    TODAY'S TREATMENT:                                                                                                                                          DATE:   3/1  PROM R shoulder all planes STM/TPR proximal biceps Supine active flexion x5  Ball roll up wall x1 (difficult) Press out from elbow at 90deg flexion- 2x5 Chicken wing 2x5 TB row YTB-2x10 Ice-66mn post tx  2/27  Wand press to flexion x10 1b Waist to neutral flexion 2x10 with 1 lb  wand  Supine active shoulder flexion 2x5 (required assist at first)   Manual- Shoulder PROM into all ranges , LAD, STM to biceps           2/16:  Manual- Shoulder PROM, LAD, STM prox biceps  -Well Arm assisted supine flexion- 2x10  Seated ER with cane (instructed for HEP) -pendulums cw and ccw   PATIENT EDUCATION: Education details: HEP, symptom management, progression of activity.  Importance of sling use. Person educated: Patient Education method: Explanation, Demonstration, Tactile cues, Verbal cues, and Handouts Education comprehension: verbalized understanding, returned demonstration, verbal cues required, tactile cues required, and needs further education  HOME EXERCISE PROGRAM: Access Code: [email or web address] URL: [email or web address] Date: 03/08/2022 Prepared  by: Carolyne Littles  Exercises - Reviewed pendulums - PROM into ER and flexion   ASSESSMENT:  CLINICAL IMPRESSION: Pt remains limited with end range flexion due to biceps tightness/discomfort. This did improve after manual techniques performed to this area. Difficulty with AAROM against gravity. Pt experienced pain in proximal biceps with ball rolls up wall, so had to discontinue. Able to complete AROM in supine position for shoulder flexion. Ice performed at end of session to decrease inflammation as pt is not going directly home.   OBJECTIVE IMPAIRMENTS: decreased activity tolerance, decreased mobility, decreased ROM, decreased strength, impaired UE functional use, and pain.   ACTIVITY LIMITATIONS: carrying, lifting, sleeping, bathing, dressing, self feeding, reach over head, and hygiene/grooming  PARTICIPATION LIMITATIONS: meal prep, cleaning, laundry, community activity, occupation, and yard work  PERSONAL FACTORS:none    REHAB POTENTIAL: Excellent  CLINICAL DECISION MAKING: Stable/uncomplicated  EVALUATION COMPLEXITY: Low   GOALS: Goals reviewed with patient? Yes  SHORT TERM GOALS: Target date: 04/06/2022    Patient will increase passive right shoulder flexion to 110 degrees Baseline: Goal status: INITIAL  2.  Patient will increase passive right shoulder external rotation to 30 degrees Baseline:  Goal status: INITIAL  3.  Patient will be independent with basic HEP following protocol suggestions Baseline:  Goal status: INITIAL   LONG TERM GOALS: Target date: 05/03/2022    Patient will reach overhead to cabinet with 2 pound weight without pain by the end of rehab Baseline:  Goal status: INITIAL  2.  Patient will reach behind her head to do her hair without pain Baseline:  Goal status: INITIAL  3.  Patient will reach behind her back without pain Baseline:  Goal status: INITIAL  PLAN:  PT FREQUENCY: 2x/week  PT DURATION: 8 weeks  PLANNED  INTERVENTIONS: Therapeutic exercises, Therapeutic activity, Neuromuscular re-education, Patient/Family education, Self Care, Joint mobilization, Aquatic Therapy, Dry Needling, Cryotherapy, Moist heat, Ultrasound, Ionotophoresis '4mg'$ /ml Dexamethasone, and Manual therapy  PLAN FOR NEXT SESSION: Begin with passive range of motion per protocol.     Sherlynn Carbon, PTA  05/06/2022, 12:51 PM

## 2022-05-10 ENCOUNTER — Ambulatory Visit (HOSPITAL_BASED_OUTPATIENT_CLINIC_OR_DEPARTMENT_OTHER): Payer: BC Managed Care – PPO

## 2022-05-10 ENCOUNTER — Encounter (HOSPITAL_BASED_OUTPATIENT_CLINIC_OR_DEPARTMENT_OTHER): Payer: Self-pay

## 2022-05-10 DIAGNOSIS — M6281 Muscle weakness (generalized): Secondary | ICD-10-CM | POA: Diagnosis not present

## 2022-05-10 DIAGNOSIS — G8929 Other chronic pain: Secondary | ICD-10-CM

## 2022-05-10 DIAGNOSIS — M25611 Stiffness of right shoulder, not elsewhere classified: Secondary | ICD-10-CM

## 2022-05-10 NOTE — Therapy (Signed)
OUTPATIENT PHYSICAL THERAPY SHOULDER EVALUATION   Patient Name: Destiny Gardner MRN: [id number] DOB:[birth year], 57 y.o., female Today's Date: 05/10/2022  END OF SESSION:  PT End of Session - 05/10/22 1309     Visit Number 18    Number of Visits 28    Date for PT Re-Evaluation 06/08/22    PT Start Time [id number]    PT Stop Time 1345    PT Time Calculation (min) 39 min    Activity Tolerance Patient tolerated treatment well    Behavior During Therapy WFL for tasks assessed/performed                  Past Medical History:  Diagnosis Date   Complication of anesthesia    post-op N/V, waking up during rhinoplasty and colonoscopy   Endometriosis    Headache    Hx Occular Migraine   Inflammatory bowel disease    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,now with constipation 2010   PONV (postoperative nausea and vomiting)    Past Surgical History:  Procedure Laterality Date   COLONOSCOPY  05/05/2009   TCS/EGD :noraml stomach,colon,and duodenum   COLONOSCOPY  2020   ESOPHAGOGASTRODUODENOSCOPY  05/05/2009   with TCS   HERNIA REPAIR  123456   Umbilical Hernia   LAPAROTOMY  03/07/1997   endometriosis   MYRINGOTOMY     Tubes,Tympanoplasty   RHINOPLASTY  1989   SHOULDER ARTHROSCOPY WITH ROTATOR CUFF REPAIR AND OPEN BICEPS TENODESIS Right 03/03/2022   Procedure: RIGHT SHOULDER ARTHROSCOPY WITH ROTATOR CUFF REPAIR AND BICEPS TENODESIS;  Surgeon: Vanetta Mulders, MD;  Location: [place];  Service: Orthopedics;  Laterality: Right;   [place] EXTRACTION     Patient Active Problem List   Diagnosis Date Noted   Tendinitis of right rotator cuff 03/03/2022   Biceps tendinitis of right shoulder 03/03/2022   Cough 05/18/2015   ABDOMINAL PAIN 05/26/2009   CHANGE IN BOWELS 01/27/2009   CONSTIPATION, CHRONIC 12/11/2008   ENDOMETRIOSIS 12/11/2008   IRRITABLE BOWEL SYNDROME, HX OF 12/11/2008    PCP: Oliver Pila FNP   REFERRING PROVIDER: Dr Vanetta Mulders    REFERRING DIAG:  Diagnosis  M75.81 (ICD-10-CM) - Tendinitis of right rotator cuff  RTC repair   THERAPY DIAG:  Muscle weakness (generalized)  Chronic right shoulder pain  Stiffness of right shoulder, not elsewhere classified  Rationale for Evaluation and Treatment: Rehabilitation  ONSET DATE: 03/03/2022  Days since surgery: 68   SUBJECTIVE:  SUBJECTIVE STATEMENT: Pt reports mild stiffness at entry. Is able to wash hair better and use arm partially for light ADLs.   PERTINENT HISTORY: Ocular Migranes  PAIN:  Are you having pain? Yes: NPRS scale: Can be 5 out of 10 still the same  Pain location: right anterior shoulder  Pain description: Cramping Aggravating factors: use of the shoulder/ sleeping  Relieving factors: rest, tylenol   PRECAUTIONS: Shoulder follow protocol   WEIGHT BEARING RESTRICTIONS: Yes NWB   FALLS:  Has patient fallen in last 6 months? No  LIVING ENVIRONMENT:  OCCUPATION: Stay at home mom.   Hobbies: work around [employer] doing house rehab, cooking.  PLOF: Independent  PATIENT GOALS:    NEXT MD VISIT:   OBJECTIVE:   DIAGNOSTIC FINDINGS:  Nothing post op PATIENT SURVEYS:  FOTO    COGNITION: Overall cognitive status: Within functional limits for tasks assessed     SENSATION: A little numbness in the hand but it has improved   POSTURE: Good.   UPPER EXTREMITY ROM:   Passive ROM Right 2/20 Left eval  Shoulder flexion 125 No limit  Shoulder extension    Shoulder abduction    Shoulder adduction    Shoulder internal rotation 60 No limit      Shoulder external rotation 25 No limit   Elbow flexion    Elbow extension    Wrist flexion    Wrist extension    Wrist ulnar deviation    Wrist radial deviation    Wrist pronation    Wrist supination     (Blank rows = not tested)  UPPER EXTREMITY MMT:  MMT Right eval Left eval  Shoulder flexion    Shoulder extension    Shoulder abduction    Shoulder adduction    Shoulder internal rotation    Shoulder external rotation    Middle trapezius    Lower trapezius    Elbow flexion    Elbow extension    Wrist flexion    Wrist extension    Wrist ulnar deviation    Wrist radial deviation    Wrist pronation    Wrist supination    Grip strength (lbs)    (Blank rows = not tested) not tested at this time secondary to recent surgery    PALPATION:  No unexpected tenderness to palpation    TODAY'S TREATMENT:                                                                                                                                          DATE:   3/5  PROM R shoulder all planes STM/TPR proximal biceps and lats/teres Supine active flexion 2x5  Sidelying active abduction 1x5  Ice-59mn post tx  3/1  PROM R shoulder all planes STM/TPR proximal biceps Supine active flexion x5  Ball roll up wall x1 (difficult) Press out from elbow at 90deg flexion- 2x5 Chicken  wing 2x5 TB row YTB-2x10 Ice-63mn post tx  2/27  Wand press to flexion x10 1b Waist to neutral flexion 2x10 with 1 lb wand  Supine active shoulder flexion 2x5 (required assist at first)   Manual- Shoulder PROM into all ranges , LAD, STM to biceps    PATIENT EDUCATION: Education details: HEP, symptom management, progression of activity.  Importance of sling use. Person educated: Patient Education method: Explanation, Demonstration, Tactile cues, Verbal cues, and Handouts Education comprehension: verbalized understanding, returned demonstration, verbal cues required, tactile cues required, and needs further education  HOME EXERCISE PROGRAM: Access Code: [email or web address] URL: [email or web address] Date: 03/08/2022 Prepared by: DCarolyne Littles Exercises - Reviewed pendulums - PROM into ER and  flexion   ASSESSMENT:  CLINICAL IMPRESSION: Trialled s/l abduction today which was challenging for pt, though she was able to complete 5 repetitions slowly. No pain with s/l ER. Updated HEP to include these exercises. Continued with STM to lats and prox. Biceps as she continue to have tightness here. She continues with discomfort in anterior shoulder following supine to sit transfer. Pt sees MD on Friday for f/u.   OBJECTIVE IMPAIRMENTS: decreased activity tolerance, decreased mobility, decreased ROM, decreased strength, impaired UE functional use, and pain.   ACTIVITY LIMITATIONS: carrying, lifting, sleeping, bathing, dressing, self feeding, reach over head, and hygiene/grooming  PARTICIPATION LIMITATIONS: meal prep, cleaning, laundry, community activity, occupation, and yard work  PERSONAL FACTORS:none    REHAB POTENTIAL: Excellent  CLINICAL DECISION MAKING: Stable/uncomplicated  EVALUATION COMPLEXITY: Low   GOALS: Goals reviewed with patient? Yes  SHORT TERM GOALS: Target date: 04/06/2022    Patient will increase passive right shoulder flexion to 110 degrees Baseline: Goal status: INITIAL  2.  Patient will increase passive right shoulder external rotation to 30 degrees Baseline:  Goal status: INITIAL  3.  Patient will be independent with basic HEP following protocol suggestions Baseline:  Goal status: INITIAL   LONG TERM GOALS: Target date: 05/03/2022    Patient will reach overhead to cabinet with 2 pound weight without pain by the end of rehab Baseline:  Goal status: INITIAL  2.  Patient will reach behind her head to do her hair without pain Baseline:  Goal status: INITIAL  3.  Patient will reach behind her back without pain Baseline:  Goal status: INITIAL  PLAN:  PT FREQUENCY: 2x/week  PT DURATION: 8 weeks  PLANNED INTERVENTIONS: Therapeutic exercises, Therapeutic activity, Neuromuscular re-education, Patient/Family education, Self Care, Joint  mobilization, Aquatic Therapy, Dry Needling, Cryotherapy, Moist heat, Ultrasound, Ionotophoresis '4mg'$ /ml Dexamethasone, and Manual therapy  PLAN FOR NEXT SESSION: Begin with passive range of motion per protocol.     RSherlynn Carbon PTA  05/10/2022, 2:22 PM

## 2022-05-13 ENCOUNTER — Ambulatory Visit (HOSPITAL_BASED_OUTPATIENT_CLINIC_OR_DEPARTMENT_OTHER): Payer: BC Managed Care – PPO | Admitting: Physical Therapy

## 2022-05-13 ENCOUNTER — Ambulatory Visit (INDEPENDENT_AMBULATORY_CARE_PROVIDER_SITE_OTHER): Payer: BC Managed Care – PPO | Admitting: Orthopaedic Surgery

## 2022-05-13 ENCOUNTER — Encounter (HOSPITAL_BASED_OUTPATIENT_CLINIC_OR_DEPARTMENT_OTHER): Payer: Self-pay | Admitting: Physical Therapy

## 2022-05-13 DIAGNOSIS — M7501 Adhesive capsulitis of right shoulder: Secondary | ICD-10-CM | POA: Diagnosis not present

## 2022-05-13 DIAGNOSIS — G8929 Other chronic pain: Secondary | ICD-10-CM

## 2022-05-13 DIAGNOSIS — M25521 Pain in right elbow: Secondary | ICD-10-CM

## 2022-05-13 DIAGNOSIS — M6281 Muscle weakness (generalized): Secondary | ICD-10-CM | POA: Diagnosis not present

## 2022-05-13 DIAGNOSIS — M25611 Stiffness of right shoulder, not elsewhere classified: Secondary | ICD-10-CM

## 2022-05-13 MED ORDER — TRIAMCINOLONE ACETONIDE 40 MG/ML IJ SUSP
80.0000 mg | INTRAMUSCULAR | Status: AC | PRN
  Administered 2022-05-13: 80 mg via INTRA_ARTICULAR

## 2022-05-13 MED ORDER — LIDOCAINE HCL 1 % IJ SOLN
4.0000 mL | INTRAMUSCULAR | Status: AC | PRN
  Administered 2022-05-13: 4 mL

## 2022-05-13 NOTE — Progress Notes (Signed)
Post Operative Evaluation    Procedure/Date of Surgery: Right shoulder rotator cuff repair biceps tenodesis 03/03/22  Interval History:    Destiny Gardner presents today for follow-up 10 status post right shoulder rotator cuff repair.  Overall she is doing very well although she is continuing to battle through capsulitis type pain.  Her range of motion is improving although this is limited in the setting of capsulitis type pain. PMH/PSH/Family History/Social History/Meds/Allergies:    Past Medical History:  Diagnosis Date  . Complication of anesthesia    post-op N/V, waking up during rhinoplasty and colonoscopy  . Endometriosis   . Headache    Hx Occular Migraine  . 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 PONV (postoperative nausea and vomiting)    Past Surgical History:  Procedure Laterality Date  . COLONOSCOPY  05/05/2009   TCS/EGD :noraml stomach,colon,and duodenum  . COLONOSCOPY  2020  . ESOPHAGOGASTRODUODENOSCOPY  05/05/2009   with TCS  . HERNIA REPAIR  123456   Umbilical Hernia  . LAPAROTOMY  03/07/1997   endometriosis  . MYRINGOTOMY     Tubes,Tympanoplasty  . RHINOPLASTY  1989  . SHOULDER ARTHROSCOPY WITH ROTATOR CUFF REPAIR AND OPEN BICEPS TENODESIS Right 03/03/2022   Procedure: RIGHT SHOULDER ARTHROSCOPY WITH ROTATOR CUFF REPAIR AND BICEPS TENODESIS;  Surgeon: Vanetta Mulders, MD;  Location: [place];  Service: Orthopedics;  Laterality: Right;  . Tonillectomy  1990  . WISDOM TOOTH EXTRACTION     Social History   Socioeconomic History  . Marital status: Married    Spouse name: Not on file  . Number of children: Not on file  . Years of education: Not on file  . Highest education level: Not on file  Occupational History  . Not on file  Tobacco Use  . Smoking status: Never  . Smokeless tobacco: Never  Substance and Sexual Activity  . Alcohol use: Yes    Alcohol/week: 0.0 standard  drinks of alcohol    Comment: social  . Drug use: No  . Sexual activity: Yes  Other Topics Concern  . Not on file  Social History Narrative  . Not on file   Social Determinants of Health   Financial Resource Strain: Not on file  Food Insecurity: Not on file  Transportation Needs: Not on file  Physical Activity: Not on file  Stress: Not on file  Social Connections: Not on file   Family History  Problem Relation Age of Onset  . COPD Mother        never smoker  . Prostate cancer Father    Allergies  Allergen Reactions  . Codeine Nausea And Vomiting  . Ketorolac Tromethamine   . Penicillins Hives  . Chlorhexidine Rash   Current Outpatient Medications  Medication Sig Dispense Refill  . Ascorbic Acid (VITAMIN C PO) Take 1 tablet by mouth daily.    Marland Kitchen aspirin EC 325 MG tablet Take 1 tablet (325 mg total) by mouth daily. 30 tablet 0  . methylPREDNISolone (MEDROL DOSEPAK) 4 MG TBPK tablet Take per packet instructions 21 each 0  . oxyCODONE (OXY IR/ROXICODONE) 5 MG immediate release tablet Take 1 tablet (5 mg total) by mouth every 4 (four) hours as needed (severe pain). 10 tablet 0   No current facility-administered medications for this visit.  No results found.  Review of Systems:   A ROS was performed including pertinent positives and negatives as documented in the HPI.   Musculoskeletal Exam:      Right shoulder incisions are well-appearing without erythema or drainage.  In the supine position she is able to forward elevate to approximately 90 degrees with external rotation at the side 20 degrees.  There is stabbing in the glenohumeral joint.  These are both somewhat painful.  Active forward elevation is to only 40 degrees with pain.  Internal rotation deferred today.   Imaging:      I personally reviewed and interpreted the radiographs.   Assessment:   10 weeks status post right shoulder rotator cuff repair.  Overall I do believe she has been battling and working  through adhesive capsulitis which I do believe is not hampering her ability to recover.  Side effect I recommended ultrasound-guided injection of the glenohumeral joint given the fact that ultrasound showed a healed repair.  I do believe that this is healed enough that we could proceed with a glenohumeral injection.  I will plan to proceed with this to hopefully get her some relief that she may begin working better with active range of motion of the shoulder Plan :    -Return to clinic 4 weeks for reassessment  Right glenohumeral injection provided after verbal consent obtained    Procedure Note  Patient: Destiny Gardner             Date of Birth: Mar 07, 1966           MRN: [id number]             Visit Date: 05/13/2022  Procedures: Visit Diagnoses: No diagnosis found.  Large Joint Inj: R glenohumeral on 05/13/2022 11:43 AM Indications: pain Details: 22 G 1.5 in needle, ultrasound-guided anterior approach  Arthrogram: No  Medications: 4 mL lidocaine 1 %; 80 mg triamcinolone acetonide 40 MG/ML Outcome: tolerated well, no immediate complications Procedure, treatment alternatives, risks and benefits explained, specific risks discussed. Consent was given by the patient. Immediately prior to procedure a time out was called to verify the correct patient, procedure, equipment, support staff and site/side marked as required. Patient was prepped and draped in the usual sterile fashion.         I personally saw and evaluated the patient, and participated in the management and treatment plan.  Vanetta Mulders, MD Attending Physician, Orthopedic Surgery  This document was dictated using Dragon voice recognition software. A reasonable attempt at proof reading has been made to minimize errors.

## 2022-05-13 NOTE — Therapy (Signed)
OUTPATIENT PHYSICAL THERAPY SHOULDER EVALUATION   Patient Name: Destiny Gardner MRN: [id number] DOB:[birth year], 57 y.o., female Today's Date: 05/10/2022  END OF SESSION:  PT End of Session - 05/10/22 1309     Visit Number 18    Number of Visits 28    Date for PT Re-Evaluation 06/08/22    PT Start Time [id number]    PT Stop Time 1345    PT Time Calculation (min) 39 min    Activity Tolerance Patient tolerated treatment well    Behavior During Therapy WFL for tasks assessed/performed                  Past Medical History:  Diagnosis Date   Complication of anesthesia    post-op N/V, waking up during rhinoplasty and colonoscopy   Endometriosis    Headache    Hx Occular Migraine   Inflammatory bowel disease    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,now with constipation 2010   PONV (postoperative nausea and vomiting)    Past Surgical History:  Procedure Laterality Date   COLONOSCOPY  05/05/2009   TCS/EGD :noraml stomach,colon,and duodenum   COLONOSCOPY  2020   ESOPHAGOGASTRODUODENOSCOPY  05/05/2009   with TCS   HERNIA REPAIR  123456   Umbilical Hernia   LAPAROTOMY  03/07/1997   endometriosis   MYRINGOTOMY     Tubes,Tympanoplasty   RHINOPLASTY  1989   SHOULDER ARTHROSCOPY WITH ROTATOR CUFF REPAIR AND OPEN BICEPS TENODESIS Right 03/03/2022   Procedure: RIGHT SHOULDER ARTHROSCOPY WITH ROTATOR CUFF REPAIR AND BICEPS TENODESIS;  Surgeon: Vanetta Mulders, MD;  Location: [place];  Service: Orthopedics;  Laterality: Right;   [place] EXTRACTION     Patient Active Problem List   Diagnosis Date Noted   Tendinitis of right rotator cuff 03/03/2022   Biceps tendinitis of right shoulder 03/03/2022   Cough 05/18/2015   ABDOMINAL PAIN 05/26/2009   CHANGE IN BOWELS 01/27/2009   CONSTIPATION, CHRONIC 12/11/2008   ENDOMETRIOSIS 12/11/2008   IRRITABLE BOWEL SYNDROME, HX OF 12/11/2008    PCP: Oliver Pila FNP   REFERRING PROVIDER: Dr Vanetta Mulders    REFERRING DIAG:  Diagnosis  M75.81 (ICD-10-CM) - Tendinitis of right rotator cuff  RTC repair   THERAPY DIAG:  Muscle weakness (generalized)  Chronic right shoulder pain  Stiffness of right shoulder, not elsewhere classified  Rationale for Evaluation and Treatment: Rehabilitation  ONSET DATE: 03/03/2022  Days since surgery: 68   SUBJECTIVE:  SUBJECTIVE STATEMENT: The patient had a guided injection today. The MD wanted her to come to her appointment but to keep it light.   PERTINENT HISTORY: Ocular Migranes  PAIN:  Are you having pain? Yes: NPRS scale: Can be 5 out of 10 still the same  Pain location: right anterior shoulder  Pain description: Cramping Aggravating factors: use of the shoulder/ sleeping  Relieving factors: rest, tylenol   PRECAUTIONS: Shoulder follow protocol   WEIGHT BEARING RESTRICTIONS: Yes NWB   FALLS:  Has patient fallen in last 6 months? No  LIVING ENVIRONMENT:  OCCUPATION: Stay at home mom.   Hobbies: work around [employer] doing house rehab, cooking.  PLOF: Independent  PATIENT GOALS:  Functional use of her arm   NEXT MD VISIT:   OBJECTIVE:   DIAGNOSTIC FINDINGS:  Nothing post op PATIENT SURVEYS:  FOTO    COGNITION: Overall cognitive status: Within functional limits for tasks assessed     SENSATION: A little numbness in the hand but it has improved   POSTURE: Good.   UPPER EXTREMITY ROM:   Passive ROM Right 2/20 Left eval  Shoulder flexion 125 No limit  Shoulder extension    Shoulder abduction    Shoulder adduction    Shoulder internal rotation 60 No limit      Shoulder external rotation 25 No limit   Elbow flexion    Elbow extension    Wrist flexion    Wrist extension    Wrist ulnar deviation    Wrist radial deviation     Wrist pronation    Wrist supination    (Blank rows = not tested)  UPPER EXTREMITY MMT:  MMT Right eval Left eval  Shoulder flexion    Shoulder extension    Shoulder abduction    Shoulder adduction    Shoulder internal rotation    Shoulder external rotation    Middle trapezius    Lower trapezius    Elbow flexion    Elbow extension    Wrist flexion    Wrist extension    Wrist ulnar deviation    Wrist radial deviation    Wrist pronation    Wrist supination    Grip strength (lbs)    (Blank rows = not tested) not tested at this time secondary to recent surgery    PALPATION:  No unexpected tenderness to palpation    TODAY'S TREATMENT:                                                                                                                                          DATE:  3/9 PROM R shoulder all planes TP release to pec and upper trap   Active ER 2x20  Press X20  Scpa retraction x20      3/5  PROM R shoulder all planes STM/TPR proximal biceps and lats/teres Supine active flexion 2x5  Sidelying active abduction  1x5  Ice-50mn post tx  3/1  PROM R shoulder all planes STM/TPR proximal biceps Supine active flexion x5  Ball roll up wall x1 (difficult) Press out from elbow at 90deg flexion- 2x5 Chicken wing 2x5 TB row YTB-2x10 Ice-159m post tx  2/27  Wand press to flexion x10 1b Waist to neutral flexion 2x10 with 1 lb wand  Supine active shoulder flexion 2x5 (required assist at first)   Manual- Shoulder PROM into all ranges , LAD, STM to biceps    PATIENT EDUCATION: Education details: HEP, symptom management, progression of activity.  Importance of sling use. Person educated: Patient Education method: Explanation, Demonstration, Tactile cues, Verbal cues, and Handouts Education comprehension: verbalized understanding, returned demonstration, verbal cues required, tactile cues required, and needs further education  HOME EXERCISE  PROGRAM: Access Code: [email or web address] URL: [email or web address] Date: 03/08/2022 Prepared by: DaCarolyne LittlesExercises - Reviewed pendulums - PROM into ER and flexion   ASSESSMENT:  CLINICAL IMPRESSION: Therapy performed a limited treatment with the patient today. She had recently had a shot. We performed ROM and light active movement. She was advised to continue this over the weekend. We will get back to full program next week. She was tender where she had the shot today> She reports functionally she is more with her right arm.   OBJECTIVE IMPAIRMENTS: decreased activity tolerance, decreased mobility, decreased ROM, decreased strength, impaired UE functional use, and pain.   ACTIVITY LIMITATIONS: carrying, lifting, sleeping, bathing, dressing, self feeding, reach over head, and hygiene/grooming  PARTICIPATION LIMITATIONS: meal prep, cleaning, laundry, community activity, occupation, and yard work  PERSONAL FACTORS:none    REHAB POTENTIAL: Excellent  CLINICAL DECISION MAKING: Stable/uncomplicated  EVALUATION COMPLEXITY: Low   GOALS: Goals reviewed with patient? Yes  SHORT TERM GOALS: Target date: 04/06/2022    Patient will increase passive right shoulder flexion to 110 degrees Baseline: Goal status: INITIAL  2.  Patient will increase passive right shoulder external rotation to 30 degrees Baseline:  Goal status: INITIAL  3.  Patient will be independent with basic HEP following protocol suggestions Baseline:  Goal status: INITIAL   LONG TERM GOALS: Target date: 05/03/2022    Patient will reach overhead to cabinet with 2 pound weight without pain by the end of rehab Baseline:  Goal status: INITIAL  2.  Patient will reach behind her head to do her hair without pain Baseline:  Goal status: INITIAL  3.  Patient will reach behind her back without pain Baseline:  Goal status: INITIAL  PLAN:  PT FREQUENCY: 2x/week  PT DURATION: 8 weeks  PLANNED  INTERVENTIONS: Therapeutic exercises, Therapeutic activity, Neuromuscular re-education, Patient/Family education, Self Care, Joint mobilization, Aquatic Therapy, Dry Needling, Cryotherapy, Moist heat, Ultrasound, Ionotophoresis '4mg'$ /ml Dexamethasone, and Manual therapy  PLAN FOR NEXT SESSION: Begin with passive range of motion per protocol.    DaCarolyne LittlesT DPT  05/10/2022, 2:22 PM

## 2022-05-17 ENCOUNTER — Ambulatory Visit (HOSPITAL_BASED_OUTPATIENT_CLINIC_OR_DEPARTMENT_OTHER): Payer: BC Managed Care – PPO

## 2022-05-17 ENCOUNTER — Encounter (HOSPITAL_BASED_OUTPATIENT_CLINIC_OR_DEPARTMENT_OTHER): Payer: Self-pay

## 2022-05-17 DIAGNOSIS — M6281 Muscle weakness (generalized): Secondary | ICD-10-CM

## 2022-05-17 DIAGNOSIS — M25611 Stiffness of right shoulder, not elsewhere classified: Secondary | ICD-10-CM

## 2022-05-17 DIAGNOSIS — G8929 Other chronic pain: Secondary | ICD-10-CM

## 2022-05-17 NOTE — Therapy (Signed)
OUTPATIENT PHYSICAL THERAPY SHOULDER EVALUATION   Patient Name: Destiny Gardner MRN: [id number] DOB:[birth year], 57 y.o., female Today's Date: 05/17/2022  END OF SESSION:  PT End of Session - 05/17/22 1349     Visit Number 20    Number of Visits 28    Date for PT Re-Evaluation 06/08/22    PT Start Time [id number]    PT Stop Time [id number]    PT Time Calculation (min) 45 min    Activity Tolerance Patient tolerated treatment well    Behavior During Therapy [facility] for tasks assessed/performed                   Past Medical History:  Diagnosis Date   Complication of anesthesia    post-op N/V, waking up during rhinoplasty and colonoscopy   Endometriosis    Headache    Hx Occular Migraine   Inflammatory bowel disease    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,now with constipation 2010   PONV (postoperative nausea and vomiting)    Past Surgical History:  Procedure Laterality Date   COLONOSCOPY  05/05/2009   TCS/EGD :noraml stomach,colon,and duodenum   COLONOSCOPY  2020   ESOPHAGOGASTRODUODENOSCOPY  05/05/2009   with TCS   HERNIA REPAIR  123456   Umbilical Hernia   LAPAROTOMY  03/07/1997   endometriosis   MYRINGOTOMY     Tubes,Tympanoplasty   RHINOPLASTY  1989   SHOULDER ARTHROSCOPY WITH ROTATOR CUFF REPAIR AND OPEN BICEPS TENODESIS Right 03/03/2022   Procedure: RIGHT SHOULDER ARTHROSCOPY WITH ROTATOR CUFF REPAIR AND BICEPS TENODESIS;  Surgeon: Vanetta Mulders, MD;  Location: [place];  Service: Orthopedics;  Laterality: Right;   [place] EXTRACTION     Patient Active Problem List   Diagnosis Date Noted   Tendinitis of right rotator cuff 03/03/2022   Biceps tendinitis of right shoulder 03/03/2022   Cough 05/18/2015   ABDOMINAL PAIN 05/26/2009   CHANGE IN BOWELS 01/27/2009   CONSTIPATION, CHRONIC 12/11/2008   ENDOMETRIOSIS 12/11/2008   IRRITABLE BOWEL SYNDROME, HX OF 12/11/2008    PCP: Oliver Pila FNP   REFERRING PROVIDER: Dr Vanetta Mulders    REFERRING DIAG:  Diagnosis  M75.81 (ICD-10-CM) - Tendinitis of right rotator cuff  RTC repair   THERAPY DIAG:  Muscle weakness (generalized)  Chronic right shoulder pain  Stiffness of right shoulder, not elsewhere classified  Rationale for Evaluation and Treatment: Rehabilitation  ONSET DATE: 03/03/2022  Days since surgery: 75   SUBJECTIVE:  SUBJECTIVE STATEMENT: Pt reports no improvement from the injection last week. "He said I could have another in 2 weeks, but I don't think I want to." Pt reports she is able to temporarily imrpove her pain level by self massage over anterior shoulder. Has been driving now.   PERTINENT HISTORY: Ocular Migranes  PAIN:  Are you having pain? Yes: NPRS scale: Can be 5 out of 10 still the same  Pain location: right anterior shoulder  Pain description: Cramping Aggravating factors: use of the shoulder/ sleeping  Relieving factors: rest, tylenol   PRECAUTIONS: Shoulder follow protocol   WEIGHT BEARING RESTRICTIONS: Yes NWB   FALLS:  Has patient fallen in last 6 months? No  LIVING ENVIRONMENT:  OCCUPATION: Stay at home mom.   Hobbies: work around [employer] doing house rehab, cooking.  PLOF: Independent  PATIENT GOALS:  Functional use of her arm   NEXT MD VISIT:   OBJECTIVE:   DIAGNOSTIC FINDINGS:  Nothing post op PATIENT SURVEYS:  FOTO    COGNITION: Overall cognitive status: Within functional limits for tasks assessed     SENSATION: A little numbness in the hand but it has improved   POSTURE: Good.   UPPER EXTREMITY ROM:   Passive ROM Right 2/20 Left eval  Shoulder flexion 125 No limit  Shoulder extension    Shoulder abduction    Shoulder adduction    Shoulder internal rotation 60 No limit      Shoulder external rotation 25 No  limit   Elbow flexion    Elbow extension    Wrist flexion    Wrist extension    Wrist ulnar deviation    Wrist radial deviation    Wrist pronation    Wrist supination    (Blank rows = not tested)  UPPER EXTREMITY MMT:  MMT Right eval Left eval  Shoulder flexion    Shoulder extension    Shoulder abduction    Shoulder adduction    Shoulder internal rotation    Shoulder external rotation    Middle trapezius    Lower trapezius    Elbow flexion    Elbow extension    Wrist flexion    Wrist extension    Wrist ulnar deviation    Wrist radial deviation    Wrist pronation    Wrist supination    Grip strength (lbs)    (Blank rows = not tested) not tested at this time secondary to recent surgery    PALPATION:  No unexpected tenderness to palpation    TODAY'S TREATMENT:                                                                                                                                          DATE:   3/12 STM and TPR to lats, teres mm, lateral scapular border- pt in sidelying arm extended rested on [facility] Scapular mobilizations PROM R shoulder  all planes  Supine flexion 2x10 Sidelying abduction 2x10 Prone shoulder extension 2x10 Pulleys flexion 2' Wall finger climb x3  3/9 PROM R shoulder all planes TP release to pec and upper trap   Active ER 2x20  Press X20  Scpa retraction x20      3/5  PROM R shoulder all planes STM/TPR proximal biceps and lats/teres Supine active flexion 2x5  Sidelying active abduction 1x5  Ice-43mn post tx    PATIENT EDUCATION: Education details: HEP, symptom management, progression of activity.  Importance of sling use. Person educated: Patient Education method: Explanation, Demonstration, Tactile cues, Verbal cues, and Handouts Education comprehension: verbalized understanding, returned demonstration, verbal cues required, tactile cues required, and needs further education  HOME EXERCISE PROGRAM: Access Code:  [email or web address] URL: [email or web address] Date: 03/08/2022 Prepared by: DCarolyne Littles Exercises - Reviewed pendulums - PROM into ER and flexion   ASSESSMENT:  CLINICAL IMPRESSION: Spent increased time on manual techniques today as large triggerpoint was palpated in posterior shoulder. Very tight/tender surrounding length of lateral scapular border. Pt may benefit from DN to this area if she continues to experience issues here. Able to lay prone for first time since surgery and complete shoulder extensions while in this position without complaint. Also progressing with AAROM in standing position. Remains challenged with reaching away from body and overhead. Will continue to work on reducing soft tissue tightness and improving GHJ mobility.  OBJECTIVE IMPAIRMENTS: decreased activity tolerance, decreased mobility, decreased ROM, decreased strength, impaired UE functional use, and pain.   ACTIVITY LIMITATIONS: carrying, lifting, sleeping, bathing, dressing, self feeding, reach over head, and hygiene/grooming  PARTICIPATION LIMITATIONS: meal prep, cleaning, laundry, community activity, occupation, and yard work  PERSONAL FACTORS:none    REHAB POTENTIAL: Excellent  CLINICAL DECISION MAKING: Stable/uncomplicated  EVALUATION COMPLEXITY: Low   GOALS: Goals reviewed with patient? Yes  SHORT TERM GOALS: Target date: 04/06/2022    Patient will increase passive right shoulder flexion to 110 degrees Baseline: Goal status: INITIAL  2.  Patient will increase passive right shoulder external rotation to 30 degrees Baseline:  Goal status: INITIAL  3.  Patient will be independent with basic HEP following protocol suggestions Baseline:  Goal status: MET 3/12   LONG TERM GOALS: Target date: 05/03/2022    Patient will reach overhead to cabinet with 2 pound weight without pain by the end of rehab Baseline:  Goal status: INITIAL  2.  Patient will reach behind her head to do  her hair without pain Baseline:  Goal status: IN PROGRESS  3.  Patient will reach behind her back without pain Baseline:  Goal status: IN PROGRESS  PLAN:  PT FREQUENCY: 2x/week  PT DURATION: 8 weeks  PLANNED INTERVENTIONS: Therapeutic exercises, Therapeutic activity, Neuromuscular re-education, Patient/Family education, Self Care, Joint mobilization, Aquatic Therapy, Dry Needling, Cryotherapy, Moist heat, Ultrasound, Ionotophoresis '4mg'$ /ml Dexamethasone, and Manual therapy  PLAN FOR NEXT SESSION: Begin with passive range of motion per protocol.    RSherlynn Carbon PTA   05/17/2022, 3:23 PM

## 2022-05-20 ENCOUNTER — Ambulatory Visit (HOSPITAL_BASED_OUTPATIENT_CLINIC_OR_DEPARTMENT_OTHER): Payer: BC Managed Care – PPO

## 2022-05-20 ENCOUNTER — Encounter (HOSPITAL_BASED_OUTPATIENT_CLINIC_OR_DEPARTMENT_OTHER): Payer: Self-pay

## 2022-05-20 DIAGNOSIS — M25611 Stiffness of right shoulder, not elsewhere classified: Secondary | ICD-10-CM

## 2022-05-20 DIAGNOSIS — G8929 Other chronic pain: Secondary | ICD-10-CM

## 2022-05-20 DIAGNOSIS — M6281 Muscle weakness (generalized): Secondary | ICD-10-CM

## 2022-05-20 NOTE — Therapy (Signed)
OUTPATIENT PHYSICAL THERAPY SHOULDER EVALUATION   Patient Name: Destiny Gardner MRN: [id number] DOB:1965/05/22, 57 y.o., female Today's Date: 05/20/2022  END OF SESSION:  PT End of Session - 05/20/22 1327     Visit Number 21    Number of Visits 28    Date for PT Re-Evaluation 06/08/22    PT Start Time 1148    PT Stop Time 1230    PT Time Calculation (min) 42 min    Activity Tolerance Patient tolerated treatment well    Behavior During Therapy WFL for tasks assessed/performed                    Past Medical History:  Diagnosis Date   Complication of anesthesia    post-op N/V, waking up during rhinoplasty and colonoscopy   Endometriosis    Headache    Hx Occular Migraine   Inflammatory bowel disease    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,now with constipation 2010   PONV (postoperative nausea and vomiting)    Past Surgical History:  Procedure Laterality Date   COLONOSCOPY  05/05/2009   TCS/EGD :noraml stomach,colon,and duodenum   COLONOSCOPY  2020   ESOPHAGOGASTRODUODENOSCOPY  05/05/2009   with TCS   HERNIA REPAIR  123456   Umbilical Hernia   LAPAROTOMY  03/07/1997   endometriosis   MYRINGOTOMY     Tubes,Tympanoplasty   RHINOPLASTY  1989   SHOULDER ARTHROSCOPY WITH ROTATOR CUFF REPAIR AND OPEN BICEPS TENODESIS Right 03/03/2022   Procedure: RIGHT SHOULDER ARTHROSCOPY WITH ROTATOR CUFF REPAIR AND BICEPS TENODESIS;  Surgeon: Vanetta Mulders, MD;  Location: [place];  Service: Orthopedics;  Laterality: Right;   [place] EXTRACTION     Patient Active Problem List   Diagnosis Date Noted   Tendinitis of right rotator cuff 03/03/2022   Biceps tendinitis of right shoulder 03/03/2022   Cough 05/18/2015   ABDOMINAL PAIN 05/26/2009   CHANGE IN BOWELS 01/27/2009   CONSTIPATION, CHRONIC 12/11/2008   ENDOMETRIOSIS 12/11/2008   IRRITABLE BOWEL SYNDROME, HX OF 12/11/2008    PCP: Oliver Pila FNP   REFERRING PROVIDER: Dr Vanetta Mulders    REFERRING DIAG:  Diagnosis  M75.81 (ICD-10-CM) - Tendinitis of right rotator cuff  RTC repair   THERAPY DIAG:  Muscle weakness (generalized)  Stiffness of right shoulder, not elsewhere classified  Chronic right shoulder pain  Rationale for Evaluation and Treatment: Rehabilitation  ONSET DATE: 03/03/2022  Days since surgery: 78   SUBJECTIVE:  SUBJECTIVE STATEMENT: Pt reports her pain has been more manageable over the last few days. Arrives with increased pain and muscle spasm today for unknown reason. "Maybe I slept on it wrong."  PERTINENT HISTORY: Ocular Migranes  PAIN:  Are you having pain? Yes: NPRS scale: Can be 5 out of 10 still the same  Pain location: right anterior shoulder  Pain description: Cramping Aggravating factors: use of the shoulder/ sleeping  Relieving factors: rest, tylenol   PRECAUTIONS: Shoulder follow protocol   WEIGHT BEARING RESTRICTIONS: Yes NWB   FALLS:  Has patient fallen in last 6 months? No  LIVING ENVIRONMENT:  OCCUPATION: Stay at home mom.   Hobbies: work around [employer] doing house rehab, cooking.  PLOF: Independent  PATIENT GOALS:  Functional use of her arm   NEXT MD VISIT:   OBJECTIVE:   DIAGNOSTIC FINDINGS:  Nothing post op PATIENT SURVEYS:  FOTO    COGNITION: Overall cognitive status: Within functional limits for tasks assessed     SENSATION: A little numbness in the hand but it has improved   POSTURE: Good.   UPPER EXTREMITY ROM:   Passive ROM Right 2/20 Left eval  Shoulder flexion 125 No limit  Shoulder extension    Shoulder abduction    Shoulder adduction    Shoulder internal rotation 60 No limit      Shoulder external rotation 25 No limit   Elbow flexion    Elbow extension    Wrist flexion    Wrist extension     Wrist ulnar deviation    Wrist radial deviation    Wrist pronation    Wrist supination    (Blank rows = not tested)  UPPER EXTREMITY MMT:  MMT Right eval Left eval  Shoulder flexion    Shoulder extension    Shoulder abduction    Shoulder adduction    Shoulder internal rotation    Shoulder external rotation    Middle trapezius    Lower trapezius    Elbow flexion    Elbow extension    Wrist flexion    Wrist extension    Wrist ulnar deviation    Wrist radial deviation    Wrist pronation    Wrist supination    Grip strength (lbs)    (Blank rows = not tested) not tested at this time secondary to recent surgery    PALPATION:  No unexpected tenderness to palpation    TODAY'S TREATMENT:                                                                                                                                          DATE:   3/15 STM and TPR to lats, teres mm, lateral scapular border- pt in sidelying arm extended rested on [facility] Scapular mobilizations PROM R shoulder all planes  Prone shoulder extension 1x12 Prone horizontal abduction 1x10 with tactile cues for scap retraction  Supine flexion 2x10 Prone shoulder extension 2x10 Wall finger climb x5  3/12 STM and TPR to lats, teres mm, lateral scapular border- pt in sidelying arm extended rested on [facility] Scapular mobilizations PROM R shoulder all planes  Supine flexion 2x10 Sidelying abduction 2x10 Prone shoulder extension 2x10 Pulleys flexion 2' Wall finger climb x3  3/9 PROM R shoulder all planes TP release to pec and upper trap   Active ER 2x20  Press X20  Scpa retraction x20    PATIENT EDUCATION: Education details: HEP, symptom management, progression of activity.  Importance of sling use. Person educated: Patient Education method: Explanation, Demonstration, Tactile cues, Verbal cues, and Handouts Education comprehension: verbalized understanding, returned demonstration, verbal cues required,  tactile cues required, and needs further education  HOME EXERCISE PROGRAM: Access Code: [email or web address] URL: [email or web address] Date: 03/08/2022 Prepared by: Carolyne Littles  Exercises - Reviewed pendulums - PROM into ER and flexion   ASSESSMENT:  CLINICAL IMPRESSION: Continued to work on STM and TPR to reduce triggerpoint presence and improve pain level. Continued with prone scapular strengthening. Trialled 1# with shoulder extension, however pt reported this was too challenging so removed it. Trialled prone horizontal abduction today with tactile cues required for rhomboid and mid trap engagement. She is slowly improving with AROM and AAROM. Pt may be papropriate for DN next visit. Will continue to progress as tolerated to improve functional strength and ROM.   OBJECTIVE IMPAIRMENTS: decreased activity tolerance, decreased mobility, decreased ROM, decreased strength, impaired UE functional use, and pain.   ACTIVITY LIMITATIONS: carrying, lifting, sleeping, bathing, dressing, self feeding, reach over head, and hygiene/grooming  PARTICIPATION LIMITATIONS: meal prep, cleaning, laundry, community activity, occupation, and yard work  PERSONAL FACTORS:none    REHAB POTENTIAL: Excellent  CLINICAL DECISION MAKING: Stable/uncomplicated  EVALUATION COMPLEXITY: Low   GOALS: Goals reviewed with patient? Yes  SHORT TERM GOALS: Target date: 04/06/2022    Patient will increase passive right shoulder flexion to 110 degrees Baseline: Goal status: INITIAL  2.  Patient will increase passive right shoulder external rotation to 30 degrees Baseline:  Goal status: INITIAL  3.  Patient will be independent with basic HEP following protocol suggestions Baseline:  Goal status: MET 3/12   LONG TERM GOALS: Target date: 05/03/2022    Patient will reach overhead to cabinet with 2 pound weight without pain by the end of rehab Baseline:  Goal status: INITIAL  2.  Patient will  reach behind her head to do her hair without pain Baseline:  Goal status: IN PROGRESS  3.  Patient will reach behind her back without pain Baseline:  Goal status: IN PROGRESS  PLAN:  PT FREQUENCY: 2x/week  PT DURATION: 8 weeks  PLANNED INTERVENTIONS: Therapeutic exercises, Therapeutic activity, Neuromuscular re-education, Patient/Family education, Self Care, Joint mobilization, Aquatic Therapy, Dry Needling, Cryotherapy, Moist heat, Ultrasound, Ionotophoresis 4mg /ml Dexamethasone, and Manual therapy  PLAN FOR NEXT SESSION: Progress AROM and AAROM   Sharnay Cashion, PTA   05/20/2022, 1:34 PM

## 2022-05-24 ENCOUNTER — Ambulatory Visit (HOSPITAL_BASED_OUTPATIENT_CLINIC_OR_DEPARTMENT_OTHER): Payer: BC Managed Care – PPO | Admitting: Physical Therapy

## 2022-05-24 ENCOUNTER — Encounter (HOSPITAL_BASED_OUTPATIENT_CLINIC_OR_DEPARTMENT_OTHER): Payer: Self-pay | Admitting: Physical Therapy

## 2022-05-24 DIAGNOSIS — M25611 Stiffness of right shoulder, not elsewhere classified: Secondary | ICD-10-CM

## 2022-05-24 DIAGNOSIS — M25521 Pain in right elbow: Secondary | ICD-10-CM

## 2022-05-24 DIAGNOSIS — M6281 Muscle weakness (generalized): Secondary | ICD-10-CM | POA: Diagnosis not present

## 2022-05-24 DIAGNOSIS — G8929 Other chronic pain: Secondary | ICD-10-CM

## 2022-05-24 NOTE — Therapy (Signed)
OUTPATIENT PHYSICAL THERAPY SHOULDER EVALUATION   Patient Name: Destiny Gardner MRN: [id number] DOB:[birth year], 57 y.o., female Today's Date: 05/24/2022  END OF SESSION:  PT End of Session - 05/24/22 1307     Visit Number 22    Number of Visits 28    Date for PT Re-Evaluation 06/08/22    PT Start Time 6962    PT Stop Time 1345    PT Time Calculation (min) 42 min    Activity Tolerance Patient tolerated treatment well    Behavior During Therapy [facility] for tasks assessed/performed                    Past Medical History:  Diagnosis Date   Complication of anesthesia    post-op N/V, waking up during rhinoplasty and colonoscopy   Endometriosis    Headache    Hx Occular Migraine   Inflammatory bowel disease    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,now with constipation 2010   PONV (postoperative nausea and vomiting)    Past Surgical History:  Procedure Laterality Date   COLONOSCOPY  05/05/2009   TCS/EGD :noraml stomach,colon,and duodenum   COLONOSCOPY  2020   ESOPHAGOGASTRODUODENOSCOPY  05/05/2009   with TCS   HERNIA REPAIR  9528   Umbilical Hernia   LAPAROTOMY  03/07/1997   endometriosis   MYRINGOTOMY     Tubes,Tympanoplasty   RHINOPLASTY  1989   SHOULDER ARTHROSCOPY WITH ROTATOR CUFF REPAIR AND OPEN BICEPS TENODESIS Right 03/03/2022   Procedure: RIGHT SHOULDER ARTHROSCOPY WITH ROTATOR CUFF REPAIR AND BICEPS TENODESIS;  Surgeon: Vanetta Mulders, MD;  Location: [place];  Service: Orthopedics;  Laterality: Right;   [place] EXTRACTION     Patient Active Problem List   Diagnosis Date Noted   Tendinitis of right rotator cuff 03/03/2022   Biceps tendinitis of right shoulder 03/03/2022   Cough 05/18/2015   ABDOMINAL PAIN 05/26/2009   CHANGE IN BOWELS 01/27/2009   CONSTIPATION, CHRONIC 12/11/2008   ENDOMETRIOSIS 12/11/2008   IRRITABLE BOWEL SYNDROME, HX OF 12/11/2008    PCP: Oliver Pila FNP   REFERRING PROVIDER: Dr Vanetta Mulders    REFERRING DIAG:  Diagnosis  M75.81 (ICD-10-CM) - Tendinitis of right rotator cuff  RTC repair   THERAPY DIAG:  No diagnosis found.  Rationale for Evaluation and Treatment: Rehabilitation  ONSET DATE: 03/03/2022  Days since surgery: 82   SUBJECTIVE:                                                                                                                                                                                      SUBJECTIVE STATEMENT: Patient reports improved pain following  trigger point release to infraspinatus area and subscap area last treatment.  She continues to work on her exercises and overhead flexion. PERTINENT HISTORY: Ocular Migranes  PAIN:  Are you having pain? Yes: NPRS scale: Can be 5 out of 10 still the same  Pain location: right anterior shoulder  Pain description: Cramping Aggravating factors: use of the shoulder/ sleeping  Relieving factors: rest, tylenol   PRECAUTIONS: Shoulder follow protocol   WEIGHT BEARING RESTRICTIONS: Yes NWB   FALLS:  Has patient fallen in last 6 months? No  LIVING ENVIRONMENT:  OCCUPATION: Stay at home mom.   Hobbies: work around [employer] doing house rehab, cooking.  PLOF: Independent  PATIENT GOALS:  Functional use of her arm   NEXT MD VISIT:   OBJECTIVE:   DIAGNOSTIC FINDINGS:  Nothing post op PATIENT SURVEYS:  FOTO    COGNITION: Overall cognitive status: Within functional limits for tasks assessed     SENSATION: A little numbness in the hand but it has improved   POSTURE: Good.   UPPER EXTREMITY ROM:   Passive ROM Right 2/20 Left eval  Shoulder flexion 125 No limit  Shoulder extension    Shoulder abduction    Shoulder adduction    Shoulder internal rotation 60 No limit      Shoulder external rotation 25 No limit   Elbow flexion    Elbow extension    Wrist flexion    Wrist extension    Wrist ulnar deviation    Wrist radial deviation    Wrist pronation    Wrist  supination    (Blank rows = not tested)  UPPER EXTREMITY MMT:  MMT Right eval Left eval  Shoulder flexion    Shoulder extension    Shoulder abduction    Shoulder adduction    Shoulder internal rotation    Shoulder external rotation    Middle trapezius    Lower trapezius    Elbow flexion    Elbow extension    Wrist flexion    Wrist extension    Wrist ulnar deviation    Wrist radial deviation    Wrist pronation    Wrist supination    Grip strength (lbs)    (Blank rows = not tested) not tested at this time secondary to recent surgery    PALPATION:  No unexpected tenderness to palpation    TODAY'S TREATMENT:                                                                                                                                          DATE:  3/19 PROM R shoulder all planes  Supine straight arm ABC one-time no weight Wand flexion 2 x 10 2 pounds Printed patient's HEP removed some exercises that she has been doing recently reviewed how to do it at home.   Trigger Point Dry-Needling  Treatment instructions: Expect mild to moderate muscle  soreness. S/S of pneumothorax if dry needled over a lung field, and to seek immediate medical attention should they occur. Patient verbalized understanding of these instructions and education.  Patient Consent Given: Yes Education handout provided: Yes Muscles treated: sub scap 2x .30x50 needle  Electrical stimulation performed: No Parameters: N/A Treatment response/outcome: none    Manual therapy: Trigger point release to subscapularis and infraspinatus area.  Posterior and inferior glides grade 1 and 2.  Abduction with pec release.    3/15 STM and TPR to lats, teres mm, lateral scapular border- pt in sidelying arm extended rested on [facility] Scapular mobilizations PROM R shoulder all planes  Prone shoulder extension 1x12 Prone horizontal abduction 1x10 with tactile cues for scap retraction Supine flexion 2x10 Prone  shoulder extension 2x10 Wall finger climb x5  3/12 STM and TPR to lats, teres mm, lateral scapular border- pt in sidelying arm extended rested on [facility] Scapular mobilizations PROM R shoulder all planes  Supine flexion 2x10 Sidelying abduction 2x10 Prone shoulder extension 2x10 Pulleys flexion 2' Wall finger climb x3  3/9 PROM R shoulder all planes TP release to pec and upper trap   Active ER 2x20  Press X20  Scpa retraction x20    PATIENT EDUCATION: Education details: HEP, symptom management, progression of activity.  Importance of sling use. Person educated: Patient Education method: Explanation, Demonstration, Tactile cues, Verbal cues, and Handouts Education comprehension: verbalized understanding, returned demonstration, verbal cues required, tactile cues required, and needs further education  HOME EXERCISE PROGRAM: Access Code: [email or web address] URL: [email or web address] Date: 03/08/2022 Prepared by: Carolyne Littles  Exercises - Reviewed pendulums - PROM into ER and flexion   ASSESSMENT:  CLINICAL IMPRESSION: Marella Chimes focused on manual therapy and trigger point dry needling trial.  Patient tolerated well.  She had excellent twitch response in her subscap area.  Will continue to work on mobilization to improve flexion and external rotation.  Her external rotation was measured at 60 degrees today.  That said for this as been.  She was advised to do her scapular exercises at home.  We will continue needling if she finds it beneficial.  We will likely needle her upper trap if needed.  We could also needle her pec area if it continues to be painful. OBJECTIVE IMPAIRMENTS: decreased activity tolerance, decreased mobility, decreased ROM, decreased strength, impaired UE functional use, and pain.   ACTIVITY LIMITATIONS: carrying, lifting, sleeping, bathing, dressing, self feeding, reach over head, and hygiene/grooming  PARTICIPATION LIMITATIONS: meal prep, cleaning,  laundry, community activity, occupation, and yard work  PERSONAL FACTORS:none    REHAB POTENTIAL: Excellent  CLINICAL DECISION MAKING: Stable/uncomplicated  EVALUATION COMPLEXITY: Low   GOALS: Goals reviewed with patient? Yes  SHORT TERM GOALS: Target date: 04/06/2022    Patient will increase passive right shoulder flexion to 110 degrees Baseline: Goal status: INITIAL  2.  Patient will increase passive right shoulder external rotation to 30 degrees Baseline:  Goal status: INITIAL  3.  Patient will be independent with basic HEP following protocol suggestions Baseline:  Goal status: MET 3/12   LONG TERM GOALS: Target date: 05/03/2022    Patient will reach overhead to cabinet with 2 pound weight without pain by the end of rehab Baseline:  Goal status: INITIAL  2.  Patient will reach behind her head to do her hair without pain Baseline:  Goal status: IN PROGRESS  3.  Patient will reach behind her back without pain Baseline:  Goal status: IN PROGRESS  PLAN:  PT FREQUENCY: 2x/week  PT DURATION: 8 weeks  PLANNED INTERVENTIONS: Therapeutic exercises, Therapeutic activity, Neuromuscular re-education, Patient/Family education, Self Care, Joint mobilization, Aquatic Therapy, Dry Needling, Cryotherapy, Moist heat, Ultrasound, Ionotophoresis 4mg /ml Dexamethasone, and Manual therapy  PLAN FOR NEXT SESSION: Progress AROM and AAROM   Rebecca Hodor, PTA   05/24/2022, 1:35 PM

## 2022-05-27 ENCOUNTER — Ambulatory Visit (HOSPITAL_BASED_OUTPATIENT_CLINIC_OR_DEPARTMENT_OTHER): Payer: BC Managed Care – PPO

## 2022-05-27 ENCOUNTER — Ambulatory Visit (HOSPITAL_BASED_OUTPATIENT_CLINIC_OR_DEPARTMENT_OTHER): Payer: BC Managed Care – PPO | Admitting: Physical Therapy

## 2022-05-27 ENCOUNTER — Encounter (HOSPITAL_BASED_OUTPATIENT_CLINIC_OR_DEPARTMENT_OTHER): Payer: Self-pay

## 2022-05-27 ENCOUNTER — Encounter (HOSPITAL_BASED_OUTPATIENT_CLINIC_OR_DEPARTMENT_OTHER): Payer: Self-pay | Admitting: Physical Therapy

## 2022-05-27 DIAGNOSIS — M6281 Muscle weakness (generalized): Secondary | ICD-10-CM

## 2022-05-27 DIAGNOSIS — G8929 Other chronic pain: Secondary | ICD-10-CM

## 2022-05-27 DIAGNOSIS — M25611 Stiffness of right shoulder, not elsewhere classified: Secondary | ICD-10-CM

## 2022-05-27 DIAGNOSIS — M25521 Pain in right elbow: Secondary | ICD-10-CM

## 2022-05-27 NOTE — Therapy (Signed)
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,now with constipation 2010   PONV (postoperative nausea and vomiting)    Past Surgical History:  Procedure Laterality Date   COLONOSCOPY  05/05/2009   TCS/EGD :noraml stomach,colon,and duodenum   COLONOSCOPY  2020   ESOPHAGOGASTRODUODENOSCOPY  05/05/2009   with TCS   HERNIA REPAIR  123456   Umbilical Hernia   LAPAROTOMY  03/07/1997   endometriosis   MYRINGOTOMY     Tubes,Tympanoplasty   RHINOPLASTY  1989   SHOULDER ARTHROSCOPY WITH ROTATOR CUFF REPAIR AND OPEN BICEPS TENODESIS Right 03/03/2022   Procedure: RIGHT SHOULDER ARTHROSCOPY WITH ROTATOR CUFF REPAIR AND BICEPS TENODESIS;  Surgeon: Vanetta Mulders, MD;  Location: [facility];  Service: Orthopedics;  Laterality: Right;   [place] EXTRACTION     Patient Active Problem List   Diagnosis Date Noted   Tendinitis of right rotator cuff 03/03/2022   Biceps tendinitis of right shoulder 03/03/2022   Cough 05/18/2015   ABDOMINAL PAIN 05/26/2009   CHANGE IN BOWELS 01/27/2009   CONSTIPATION, CHRONIC 12/11/2008   ENDOMETRIOSIS 12/11/2008   IRRITABLE BOWEL SYNDROME, HX OF 12/11/2008    PCP: Oliver Pila FNP   REFERRING PROVIDER: Dr Vanetta Mulders   REFERRING DIAG:  Diagnosis  M75.81 (ICD-10-CM) - Tendinitis of right rotator cuff  RTC repair   THERAPY DIAG:   No diagnosis found.  Rationale for Evaluation and Treatment: Rehabilitation  ONSET DATE: 03/03/2022  Days since surgery: 85   SUBJECTIVE:                                                                                                                                                                                      SUBJECTIVE STATEMENT: The patient woke up with spasming this morning She thinks she may have slept on it.   PERTINENT HISTORY: Ocular Migranes  PAIN:  Are you having pain? Yes: NPRS scale: 2-3/10  Pain location: right anterior shoulder  Pain description: Cramping Aggravating factors: use of the shoulder/ sleeping  Relieving factors: rest, tylenol   PRECAUTIONS: Shoulder follow protocol   WEIGHT BEARING RESTRICTIONS: Yes NWB   FALLS:  Has patient fallen in last 6 months? No  LIVING ENVIRONMENT:  OCCUPATION: Stay at home mom.   Hobbies: work around [employer] doing house rehab, cooking.  PLOF: Independent  PATIENT GOALS:  Functional use of her arm   NEXT MD VISIT:   OBJECTIVE:   DIAGNOSTIC FINDINGS:  Nothing post op PATIENT SURVEYS:  FOTO    COGNITION: Overall cognitive status: Within functional limits for tasks assessed     SENSATION: A little numbness in the hand but it has improved   POSTURE: Good.   UPPER EXTREMITY ROM:   Passive ROM Right 2/20 Left eval  Shoulder flexion 125 No limit  Shoulder extension    Shoulder abduction    Shoulder adduction    Shoulder internal rotation 60 No limit      Shoulder external rotation 25 No limit   Elbow flexion    Elbow extension    Wrist flexion    Wrist extension    Wrist ulnar deviation    Wrist radial deviation    Wrist pronation    Wrist supination    (Blank rows = not tested)  UPPER EXTREMITY MMT:  MMT Right eval Left eval  Shoulder flexion    Shoulder extension    Shoulder abduction    Shoulder adduction    Shoulder internal rotation    Shoulder external rotation     Middle trapezius    Lower trapezius    Elbow flexion    Elbow extension    Wrist flexion    Wrist extension    Wrist ulnar deviation    Wrist radial deviation    Wrist pronation    Wrist supination    Grip strength (lbs)    (Blank rows = not tested) not tested at this time secondary to recent surgery    PALPATION:  No unexpected tenderness to palpation    TODAY'S TREATMENT:                                                                                                                                          DATE:  3/22 PROM R shoulder all planes  Supine straight arm ABC one-time 1lb t Wand flexion 2 x 10 2 pounds   Unable to do extensions with yellow  Row 2x10 red   T band IR 2x10 yellow    3/19 PROM R shoulder all planes  Supine straight arm ABC one-time no weight Wand flexion 2 x 10 2 pounds Printed patient's HEP removed some exercises that she has been doing recently reviewed how to do it at home.   Trigger Point Dry-Needling  Treatment  instructions: Expect mild to moderate muscle soreness. S/S of pneumothorax if dry needled over a lung field, and to seek immediate medical attention should they occur. Patient verbalized understanding of these instructions and education.  Patient Consent Given: Yes Education handout provided: Yes Muscles treated: sub scap 2x .30x50 needle  Electrical stimulation performed: No Parameters: N/A Treatment response/outcome: none    Manual therapy: Trigger point release to subscapularis and infraspinatus area.  Posterior and inferior glides grade 1 and 2.  Abduction with pec release.    3/15 STM and TPR to lats, teres mm, lateral scapular border- pt in sidelying arm extended rested on [facility] Scapular mobilizations PROM R shoulder all planes  Prone shoulder extension 1x12 Prone horizontal abduction 1x10 with tactile cues for scap retraction Supine flexion 2x10 Prone shoulder extension 2x10 Wall finger climb x5    PATIENT  EDUCATION: Education details: HEP, symptom management, progression of activity.  Importance of sling use. Person educated: Patient Education method: Explanation, Demonstration, Tactile cues, Verbal cues, and Handouts Education comprehension: verbalized understanding, returned demonstration, verbal cues required, tactile cues required, and needs further education  HOME EXERCISE PROGRAM: Access Code: [email or web address] URL: [email or web address] Date: 03/08/2022 Prepared by: Carolyne Littles  Exercises - Reviewed pendulums - PROM into ER and flexion   ASSESSMENT:  CLINICAL IMPRESSION: The patient continues to have difficulty with shoulder extension. She feels a pulling in the front even if she goes in low range. We will continue to hold on this activity. Her ER was tight in the beginning but improved significantly as we worked on manual therapy. We will continue to progress as tolerated. We will hopefully progress to standing flexion and scaption next visit.    OBJECTIVE IMPAIRMENTS: decreased activity tolerance, decreased mobility, decreased ROM, decreased strength, impaired UE functional use, and pain.   ACTIVITY LIMITATIONS: carrying, lifting, sleeping, bathing, dressing, self feeding, reach over head, and hygiene/grooming  PARTICIPATION LIMITATIONS: meal prep, cleaning, laundry, community activity, occupation, and yard work  PERSONAL FACTORS:none    REHAB POTENTIAL: Excellent  CLINICAL DECISION MAKING: Stable/uncomplicated  EVALUATION COMPLEXITY: Low   GOALS: Goals reviewed with patient? Yes  SHORT TERM GOALS: Target date: 04/06/2022    Patient will increase passive right shoulder flexion to 110 degrees Baseline: Goal status: INITIAL  2.  Patient will increase passive right shoulder external rotation to 30 degrees Baseline:  Goal status: INITIAL  3.  Patient will be independent with basic HEP following protocol suggestions Baseline:  Goal status: MET  3/12   LONG TERM GOALS: Target date: 05/03/2022    Patient will reach overhead to cabinet with 2 pound weight without pain by the end of rehab Baseline:  Goal status: INITIAL  2.  Patient will reach behind her head to do her hair without pain Baseline:  Goal status: IN PROGRESS  3.  Patient will reach behind her back without pain Baseline:  Goal status: IN PROGRESS  PLAN:  PT FREQUENCY: 2x/week  PT DURATION: 8 weeks  PLANNED INTERVENTIONS: Therapeutic exercises, Therapeutic activity, Neuromuscular re-education, Patient/Family education, Self Care, Joint mobilization, Aquatic Therapy, Dry Needling, Cryotherapy, Moist heat, Ultrasound, Ionotophoresis 4mg /ml Dexamethasone, and Manual therapy  PLAN FOR NEXT SESSION: Progress AROM and AAROM    05/27/2022, 3:39 PM

## 2022-05-31 ENCOUNTER — Encounter (HOSPITAL_BASED_OUTPATIENT_CLINIC_OR_DEPARTMENT_OTHER): Payer: Self-pay | Admitting: Physical Therapy

## 2022-05-31 ENCOUNTER — Ambulatory Visit (HOSPITAL_BASED_OUTPATIENT_CLINIC_OR_DEPARTMENT_OTHER): Payer: BC Managed Care – PPO | Admitting: Physical Therapy

## 2022-05-31 DIAGNOSIS — M25521 Pain in right elbow: Secondary | ICD-10-CM

## 2022-05-31 DIAGNOSIS — M6281 Muscle weakness (generalized): Secondary | ICD-10-CM

## 2022-05-31 DIAGNOSIS — G8929 Other chronic pain: Secondary | ICD-10-CM

## 2022-05-31 DIAGNOSIS — M25611 Stiffness of right shoulder, not elsewhere classified: Secondary | ICD-10-CM

## 2022-05-31 NOTE — Therapy (Signed)
OUTPATIENT PHYSICAL THERAPY SHOULDER EVALUATION   Patient Name: Destiny Gardner MRN: [id number] DOB:March 03, 1966, 57 y.o., female Today's Date: 05/27/2022  END OF SESSION:  PT End of Session - 05/27/22 1537     Visit Number 23    Number of Visits 28    Date for PT Re-Evaluation 06/08/22    PT Start Time [id number]    PT Stop Time 1428    PT Time Calculation (min) 43 min                    Past Medical History:  Diagnosis Date   Complication of anesthesia    post-op N/V, waking up during rhinoplasty and colonoscopy   Endometriosis    Headache    Hx Occular Migraine   Inflammatory bowel disease    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,now with constipation 2010   PONV (postoperative nausea and vomiting)    Past Surgical History:  Procedure Laterality Date   COLONOSCOPY  05/05/2009   TCS/EGD :noraml stomach,colon,and duodenum   COLONOSCOPY  2020   ESOPHAGOGASTRODUODENOSCOPY  05/05/2009   with TCS   HERNIA REPAIR  123456   Umbilical Hernia   LAPAROTOMY  03/07/1997   endometriosis   MYRINGOTOMY     Tubes,Tympanoplasty   RHINOPLASTY  1989   SHOULDER ARTHROSCOPY WITH ROTATOR CUFF REPAIR AND OPEN BICEPS TENODESIS Right 03/03/2022   Procedure: RIGHT SHOULDER ARTHROSCOPY WITH ROTATOR CUFF REPAIR AND BICEPS TENODESIS;  Surgeon: Vanetta Mulders, MD;  Location: [facility];  Service: Orthopedics;  Laterality: Right;   [place] EXTRACTION     Patient Active Problem List   Diagnosis Date Noted   Tendinitis of right rotator cuff 03/03/2022   Biceps tendinitis of right shoulder 03/03/2022   Cough 05/18/2015   ABDOMINAL PAIN 05/26/2009   CHANGE IN BOWELS 01/27/2009   CONSTIPATION, CHRONIC 12/11/2008   ENDOMETRIOSIS 12/11/2008   IRRITABLE BOWEL SYNDROME, HX OF 12/11/2008    PCP: Oliver Pila FNP   REFERRING PROVIDER: Dr Vanetta Mulders   REFERRING DIAG:  Diagnosis  M75.81 (ICD-10-CM) - Tendinitis of right rotator cuff  RTC repair   THERAPY DIAG:   No diagnosis found.  Rationale for Evaluation and Treatment: Rehabilitation  ONSET DATE: 03/03/2022  Days since surgery: 85   SUBJECTIVE:                                                                                                                                                                                      SUBJECTIVE STATEMENT: The patient reports overall she is doing well but she is frustrated by focal pain in her biceps area. She has been using her  arm more.    PERTINENT HISTORY: Ocular Migranes  PAIN:  Are you having pain? Yes: NPRS scale: 2-3/10  Pain location: right anterior shoulder  Pain description: Cramping Aggravating factors: use of the shoulder/ sleeping  Relieving factors: rest, tylenol   PRECAUTIONS: Shoulder follow protocol   WEIGHT BEARING RESTRICTIONS: Yes NWB   FALLS:  Has patient fallen in last 6 months? No  LIVING ENVIRONMENT:  OCCUPATION: Stay at home mom.   Hobbies: work around [employer] doing house rehab, cooking.  PLOF: Independent  PATIENT GOALS:  Functional use of her arm   NEXT MD VISIT:   OBJECTIVE:   DIAGNOSTIC FINDINGS:  Nothing post op PATIENT SURVEYS:  FOTO    COGNITION: Overall cognitive status: Within functional limits for tasks assessed     SENSATION: A little numbness in the hand but it has improved   POSTURE: Good.   UPPER EXTREMITY ROM:   Passive ROM Right 2/20 Left eval  Shoulder flexion 125 No limit  Shoulder extension    Shoulder abduction    Shoulder adduction    Shoulder internal rotation 60 No limit      Shoulder external rotation 25 No limit   Elbow flexion    Elbow extension    Wrist flexion    Wrist extension    Wrist ulnar deviation    Wrist radial deviation    Wrist pronation    Wrist supination    (Blank rows = not tested)  UPPER EXTREMITY MMT:  MMT Right eval Left eval  Shoulder flexion    Shoulder extension    Shoulder abduction    Shoulder adduction    Shoulder  internal rotation    Shoulder external rotation    Middle trapezius    Lower trapezius    Elbow flexion    Elbow extension    Wrist flexion    Wrist extension    Wrist ulnar deviation    Wrist radial deviation    Wrist pronation    Wrist supination    Grip strength (lbs)    (Blank rows = not tested) not tested at this time secondary to recent surgery    PALPATION:  No unexpected tenderness to palpation    TODAY'S TREATMENT:                                                                                                                                         3/26 Pulley flexion and abduction 2 mins each   Wand flexion 2 x 10 2 pounds Supine straight arm ABC 2x 1lb   Side lying ER 2x10   T band IR 2x10 yellow  Standing flexion 2x10 with thumb up able to go 130 2x10        DATE:  3/22 PROM R shoulder all planes; grade I and II PA mobilization; trigger point release to pec;   Supine straight arm ABC one-time  1lb  Wand flexion 2 x 10 2 pounds  Unable to do extensions with yellow  Row 2x10 red   T band IR 2x10 yellow    3/19 PROM R shoulder all planes  Supine straight arm ABC one-time no weight Wand flexion 2 x 10 2 pounds Printed patient's HEP removed some exercises that she has been doing recently reviewed how to do it at home.   Trigger Point Dry-Needling  Treatment instructions: Expect mild to moderate muscle soreness. S/S of pneumothorax if dry needled over a lung field, and to seek immediate medical attention should they occur. Patient verbalized understanding of these instructions and education.  Patient Consent Given: Yes Education handout provided: Yes Muscles treated: sub scap 2x .30x50 needle  Electrical stimulation performed: No Parameters: N/A Treatment response/outcome: none    Manual therapy: Trigger point release to subscapularis and infraspinatus area.  Posterior and inferior glides grade 1 and 2.  Abduction with pec  release.    3/15 STM and TPR to lats, teres mm, lateral scapular border- pt in sidelying arm extended rested on [facility] Scapular mobilizations PROM R shoulder all planes  Prone shoulder extension 1x12 Prone horizontal abduction 1x10 with tactile cues for scap retraction Supine flexion 2x10 Prone shoulder extension 2x10 Wall finger climb x5    PATIENT EDUCATION: Education details: HEP, symptom management, progression of activity.  Importance of sling use. Person educated: Patient Education method: Explanation, Demonstration, Tactile cues, Verbal cues, and Handouts Education comprehension: verbalized understanding, returned demonstration, verbal cues required, tactile cues required, and needs further education  HOME EXERCISE PROGRAM: Access Code: [email or web address] URL: [email or web address] Date: 03/08/2022 Prepared by: Carolyne Littles  Exercises - Reviewed pendulums - PROM into ER and flexion   ASSESSMENT:  CLINICAL IMPRESSION: The patient demonstrated improved forward flexion today. With a thumbs up position she as able to flex about 130lbs. She continues to have an area of sharp focal pain in the front of her shoulder. The pain is positional. We reviewed IR stretching. She tolerated well. Her IR is progressing well. The patient would like to try to get back to the MD before she goes to [place]. Therapy spoke with MD about patient and her progress. Therapy will continue to progress as tolerated.  OBJECTIVE IMPAIRMENTS: decreased activity tolerance, decreased mobility, decreased ROM, decreased strength, impaired UE functional use, and pain.   ACTIVITY LIMITATIONS: carrying, lifting, sleeping, bathing, dressing, self feeding, reach over head, and hygiene/grooming  PARTICIPATION LIMITATIONS: meal prep, cleaning, laundry, community activity, occupation, and yard work  PERSONAL FACTORS:none    REHAB POTENTIAL: Excellent  CLINICAL DECISION MAKING:  Stable/uncomplicated  EVALUATION COMPLEXITY: Low   GOALS: Goals reviewed with patient? Yes  SHORT TERM GOALS: Target date: 04/06/2022    Patient will increase passive right shoulder flexion to 110 degrees Baseline: Goal status: INITIAL  2.  Patient will increase passive right shoulder external rotation to 30 degrees Baseline:  Goal status: INITIAL  3.  Patient will be independent with basic HEP following protocol suggestions Baseline:  Goal status: MET 3/12   LONG TERM GOALS: Target date: 05/03/2022    Patient will reach overhead to cabinet with 2 pound weight without pain by the end of rehab Baseline:  Goal status: INITIAL  2.  Patient will reach behind her head to do her hair without pain Baseline:  Goal status: IN PROGRESS  3.  Patient will reach behind her back without pain Baseline:  Goal status: IN PROGRESS  PLAN:  PT FREQUENCY: 2x/week  PT DURATION: 8  weeks  PLANNED INTERVENTIONS: Therapeutic exercises, Therapeutic activity, Neuromuscular re-education, Patient/Family education, Self Care, Joint mobilization, Aquatic Therapy, Dry Needling, Cryotherapy, Moist heat, Ultrasound, Ionotophoresis 4mg /ml Dexamethasone, and Manual therapy  PLAN FOR NEXT SESSION: Progress AROM and AAROM   Carolyne Littles PT DPT  05/27/2022, 3:39 PM

## 2022-06-02 ENCOUNTER — Encounter (HOSPITAL_BASED_OUTPATIENT_CLINIC_OR_DEPARTMENT_OTHER): Payer: Self-pay | Admitting: Orthopaedic Surgery

## 2022-06-06 ENCOUNTER — Encounter (HOSPITAL_BASED_OUTPATIENT_CLINIC_OR_DEPARTMENT_OTHER): Payer: Self-pay

## 2022-06-06 ENCOUNTER — Ambulatory Visit (HOSPITAL_BASED_OUTPATIENT_CLINIC_OR_DEPARTMENT_OTHER): Payer: BC Managed Care – PPO | Attending: Family Medicine

## 2022-06-06 DIAGNOSIS — M6281 Muscle weakness (generalized): Secondary | ICD-10-CM | POA: Diagnosis present

## 2022-06-06 DIAGNOSIS — M25511 Pain in right shoulder: Secondary | ICD-10-CM | POA: Diagnosis present

## 2022-06-06 DIAGNOSIS — M25521 Pain in right elbow: Secondary | ICD-10-CM | POA: Insufficient documentation

## 2022-06-06 DIAGNOSIS — M25611 Stiffness of right shoulder, not elsewhere classified: Secondary | ICD-10-CM | POA: Diagnosis present

## 2022-06-06 DIAGNOSIS — G8929 Other chronic pain: Secondary | ICD-10-CM | POA: Insufficient documentation

## 2022-06-06 NOTE — Therapy (Signed)
OUTPATIENT PHYSICAL THERAPY SHOULDER EVALUATION   Patient Name: Destiny Gardner MRN: [id number] DOB:1965-12-23, 57 y.o., female Today's Date: 06/06/2022  END OF SESSION:  PT End of Session - 06/06/22 1510     Visit Number 25    Number of Visits 28    Date for PT Re-Evaluation 06/08/22    PT Start Time 1430    PT Stop Time 1515    PT Time Calculation (min) 45 min    Activity Tolerance Patient tolerated treatment well    Behavior During Therapy [facility] for tasks assessed/performed                     Past Medical History:  Diagnosis Date   Complication of anesthesia    post-op N/V, waking up during rhinoplasty and colonoscopy   Endometriosis    Headache    Hx Occular Migraine   Inflammatory bowel disease    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,now with constipation 2010   PONV (postoperative nausea and vomiting)    Past Surgical History:  Procedure Laterality Date   COLONOSCOPY  05/05/2009   TCS/EGD :noraml stomach,colon,and duodenum   COLONOSCOPY  2020   ESOPHAGOGASTRODUODENOSCOPY  05/05/2009   with TCS   HERNIA REPAIR  123456   Umbilical Hernia   LAPAROTOMY  03/07/1997   endometriosis   MYRINGOTOMY     Tubes,Tympanoplasty   RHINOPLASTY  1989   SHOULDER ARTHROSCOPY WITH ROTATOR CUFF REPAIR AND OPEN BICEPS TENODESIS Right 03/03/2022   Procedure: RIGHT SHOULDER ARTHROSCOPY WITH ROTATOR CUFF REPAIR AND BICEPS TENODESIS;  Surgeon: Vanetta Mulders, MD;  Location: [place];  Service: Orthopedics;  Laterality: Right;   [place] EXTRACTION     Patient Active Problem List   Diagnosis Date Noted   Tendinitis of right rotator cuff 03/03/2022   Biceps tendinitis of right shoulder 03/03/2022   Cough 05/18/2015   ABDOMINAL PAIN 05/26/2009   CHANGE IN BOWELS 01/27/2009   CONSTIPATION, CHRONIC 12/11/2008   ENDOMETRIOSIS 12/11/2008   IRRITABLE BOWEL SYNDROME, HX OF 12/11/2008    PCP: Oliver Pila FNP   REFERRING PROVIDER: Dr Vanetta Mulders    REFERRING DIAG:  Diagnosis  M75.81 (ICD-10-CM) - Tendinitis of right rotator cuff  RTC repair   THERAPY DIAG:  Muscle weakness (generalized)  Stiffness of right shoulder, not elsewhere classified  Chronic right shoulder pain  Rationale for Evaluation and Treatment: Rehabilitation  ONSET DATE: 03/03/2022  Days since surgery: 95   SUBJECTIVE:  SUBJECTIVE STATEMENT: Pt reports mild improvement in anterior shoulder pain, though still present. Still having muscle cramps in R shoulder. "It just feels weak."  PERTINENT HISTORY: Ocular Migranes  PAIN:  Are you having pain? Yes: NPRS scale: 2-3/10  Pain location: right anterior shoulder  Pain description: Cramping Aggravating factors: use of the shoulder/ sleeping  Relieving factors: rest, tylenol   PRECAUTIONS: Shoulder follow protocol   WEIGHT BEARING RESTRICTIONS: Yes NWB   FALLS:  Has patient fallen in last 6 months? No  LIVING ENVIRONMENT:  OCCUPATION: Stay at home mom.   Hobbies: work around [employer] doing house rehab, cooking.  PLOF: Independent  PATIENT GOALS:  Functional use of her arm   NEXT MD VISIT:   OBJECTIVE:   DIAGNOSTIC FINDINGS:  Nothing post op PATIENT SURVEYS:  FOTO    COGNITION: Overall cognitive status: Within functional limits for tasks assessed     SENSATION: A little numbness in the hand but it has improved   POSTURE: Good.   UPPER EXTREMITY ROM:   Passive ROM Right 2/20 Left eval  Shoulder flexion 125 No limit  Shoulder extension    Shoulder abduction    Shoulder adduction    Shoulder internal rotation 60 No limit      Shoulder external rotation 25 No limit   Elbow flexion    Elbow extension    Wrist flexion    Wrist extension    Wrist ulnar deviation    Wrist radial deviation     Wrist pronation    Wrist supination    (Blank rows = not tested)  UPPER EXTREMITY MMT:  MMT Right eval Left eval  Shoulder flexion    Shoulder extension    Shoulder abduction    Shoulder adduction    Shoulder internal rotation    Shoulder external rotation    Middle trapezius    Lower trapezius    Elbow flexion    Elbow extension    Wrist flexion    Wrist extension    Wrist ulnar deviation    Wrist radial deviation    Wrist pronation    Wrist supination    Grip strength (lbs)    (Blank rows = not tested) not tested at this time secondary to recent surgery    PALPATION:  No unexpected tenderness to palpation    TODAY'S TREATMENT:                                                                                                                                          4/1 PROM R shoulder  Pulley flexion and abduction 2 mins each   Supine active flexion 1# 2x10 Supine straight arm ABC 2x 1lb   Side lying ER 2x10    Standing flexion  2x10  Standing abduction 2x10  3/26 Pulley flexion and abduction 2 mins each   Supine straight arm ABC 2x 1lb  Supine active flexion 2x10 1lb  Side lying ER 2x10   T band IR 2x10 yellow  Standing flexion 2x10 with thumb up able to go 130 2x10        DATE:  3/22 PROM R shoulder all planes; grade I and II PA mobilization; trigger point release to pec;   Supine straight arm ABC one-time 1lb  Wand flexion 2 x 10 2 pounds  Unable to do extensions with yellow  Row 2x10 red   T band IR 2x10 yellow    3/19 PROM R shoulder all planes  Supine straight arm ABC one-time no weight Wand flexion 2 x 10 2 pounds Printed patient's HEP removed some exercises that she has been doing recently reviewed how to do it at home.   Trigger Point Dry-Needling  Treatment instructions: Expect mild to moderate muscle soreness. S/S of pneumothorax if dry needled over a lung field, and to seek immediate medical attention should they occur.  Patient verbalized understanding of these instructions and education.  Patient Consent Given: Yes Education handout provided: Yes Muscles treated: sub scap 2x .30x50 needle  Electrical stimulation performed: No Parameters: N/A Treatment response/outcome: none    Manual therapy: Trigger point release to subscapularis and infraspinatus area.  Posterior and inferior glides grade 1 and 2.  Abduction with pec release.    3/15 STM and TPR to lats, teres mm, lateral scapular border- pt in sidelying arm extended rested on [facility] Scapular mobilizations PROM R shoulder all planes  Prone shoulder extension 1x12 Prone horizontal abduction 1x10 with tactile cues for scap retraction Supine flexion 2x10 Prone shoulder extension 2x10 Wall finger climb x5    PATIENT EDUCATION: Education details: HEP, symptom management, progression of activity.  Importance of sling use. Person educated: Patient Education method: Explanation, Demonstration, Tactile cues, Verbal cues, and Handouts Education comprehension: verbalized understanding, returned demonstration, verbal cues required, tactile cues required, and needs further education  HOME EXERCISE PROGRAM: Access Code: [email or web address] URL: [email or web address] Date: 03/08/2022 Prepared by: Carolyne Littles  Exercises - Reviewed pendulums - PROM into ER and flexion   ASSESSMENT:  CLINICAL IMPRESSION: Pt is improving with tolerance for AROM, though she still has difficulty achieving full ROM OH in standing position. Improving with IR behind back. Only experienced discomfort in anterior shoulder by end of session. Instructed p to use ice over shoulder when she gets home. Overall, she is progressing well and requires additional PT for strengthening.  OBJECTIVE IMPAIRMENTS: decreased activity tolerance, decreased mobility, decreased ROM, decreased strength, impaired UE functional use, and pain.   ACTIVITY LIMITATIONS: carrying, lifting, sleeping,  bathing, dressing, self feeding, reach over head, and hygiene/grooming  PARTICIPATION LIMITATIONS: meal prep, cleaning, laundry, community activity, occupation, and yard work  PERSONAL FACTORS:none    REHAB POTENTIAL: Excellent  CLINICAL DECISION MAKING: Stable/uncomplicated  EVALUATION COMPLEXITY: Low   GOALS: Goals reviewed with patient? Yes  SHORT TERM GOALS: Target date: 04/06/2022    Patient will increase passive right shoulder flexion to 110 degrees Baseline: Goal status: INITIAL  2.  Patient will increase passive right shoulder external rotation to 30 degrees Baseline:  Goal status: INITIAL  3.  Patient will be independent with basic HEP following protocol suggestions Baseline:  Goal status: MET 3/12   LONG TERM GOALS: Target date: 05/03/2022    Patient will reach overhead to cabinet with 2 pound weight without pain by the end of rehab Baseline:  Goal status: INITIAL  2.  Patient will reach behind her head to do her hair without  pain Baseline:  Goal status: IN PROGRESS  3.  Patient will reach behind her back without pain Baseline:  Goal status: IN PROGRESS  PLAN:  PT FREQUENCY: 2x/week  PT DURATION: 8 weeks  PLANNED INTERVENTIONS: Therapeutic exercises, Therapeutic activity, Neuromuscular re-education, Patient/Family education, Self Care, Joint mobilization, Aquatic Therapy, Dry Needling, Cryotherapy, Moist heat, Ultrasound, Ionotophoresis 4mg /ml Dexamethasone, and Manual therapy  PLAN FOR NEXT SESSION: Progress AROM and AAROM   Estha Few, PTA  06/06/2022, 4:16 PM

## 2022-06-08 ENCOUNTER — Ambulatory Visit (HOSPITAL_BASED_OUTPATIENT_CLINIC_OR_DEPARTMENT_OTHER): Payer: BC Managed Care – PPO | Admitting: Physical Therapy

## 2022-06-08 ENCOUNTER — Encounter (HOSPITAL_BASED_OUTPATIENT_CLINIC_OR_DEPARTMENT_OTHER): Payer: Self-pay | Admitting: Physical Therapy

## 2022-06-08 DIAGNOSIS — M6281 Muscle weakness (generalized): Secondary | ICD-10-CM | POA: Diagnosis not present

## 2022-06-08 DIAGNOSIS — G8929 Other chronic pain: Secondary | ICD-10-CM

## 2022-06-08 DIAGNOSIS — M25521 Pain in right elbow: Secondary | ICD-10-CM

## 2022-06-08 DIAGNOSIS — M25611 Stiffness of right shoulder, not elsewhere classified: Secondary | ICD-10-CM

## 2022-06-08 NOTE — Therapy (Signed)
OUTPATIENT PHYSICAL THERAPY SHOULDER EVALUATION   Patient Name: Destiny Gardner MRN: [id number] DOB:Aug 11, 1965, 57 y.o., , female Today's Date: 06/08/2022  END OF SESSION:  PT End of Session - 06/08/22 1109     Visit Number 26    Number of Visits 38    Date for PT Re-Evaluation 07/20/22    PT Start Time 1104    PT Stop Time 1145    PT Time Calculation (min) 41 min    Activity Tolerance Patient tolerated treatment well    Behavior During Therapy WFL for tasks assessed/performed                     Past Medical History:  Diagnosis Date   Complication of anesthesia    post-op N/V, waking up during rhinoplasty and colonoscopy   Endometriosis    Headache    Hx Occular Migraine   Inflammatory bowel disease    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,now with constipation 2010   PONV (postoperative nausea and vomiting)    Past Surgical History:  Procedure Laterality Date   COLONOSCOPY  05/05/2009   TCS/EGD :noraml stomach,colon,and duodenum   COLONOSCOPY  2020   ESOPHAGOGASTRODUODENOSCOPY  05/05/2009   with TCS   HERNIA REPAIR  123456   Umbilical Hernia   LAPAROTOMY  03/07/1997   endometriosis   MYRINGOTOMY     Tubes,Tympanoplasty   RHINOPLASTY  1989   SHOULDER ARTHROSCOPY WITH ROTATOR CUFF REPAIR AND OPEN BICEPS TENODESIS Right 03/03/2022   Procedure: RIGHT SHOULDER ARTHROSCOPY WITH ROTATOR CUFF REPAIR AND BICEPS TENODESIS;  Surgeon: Vanetta Mulders, MD;  Location: [place];  Service: Orthopedics;  Laterality: Right;   [place] EXTRACTION     Patient Active Problem List   Diagnosis Date Noted   Tendinitis of right rotator cuff 03/03/2022   Biceps tendinitis of right shoulder 03/03/2022   Cough 05/18/2015   ABDOMINAL PAIN 05/26/2009   CHANGE IN BOWELS 01/27/2009   CONSTIPATION, CHRONIC 12/11/2008   ENDOMETRIOSIS 12/11/2008   IRRITABLE BOWEL SYNDROME, HX OF 12/11/2008    PCP: Oliver Pila FNP   REFERRING PROVIDER: Dr Vanetta Mulders    REFERRING DIAG:  Diagnosis  M75.81 (ICD-10-CM) - Tendinitis of right rotator cuff  RTC repair   THERAPY DIAG:  Muscle weakness (generalized)  Stiffness of right shoulder, not elsewhere classified  Chronic right shoulder pain  Pain in right elbow  Rationale for Evaluation and Treatment: Rehabilitation  ONSET DATE: 03/03/2022  Days since surgery: 97   SUBJECTIVE:  SUBJECTIVE STATEMENT: Pt reports no pain. Patient reports that she has only been doing pulley stretch at home.   PERTINENT HISTORY: Ocular Migranes  PAIN:  Are you having pain? Yes: NPRS scale: 2-3/10  Pain location: right anterior shoulder  Pain description: Cramping Aggravating factors: use of the shoulder/ sleeping  Relieving factors: rest, tylenol   PRECAUTIONS: Shoulder follow protocol   WEIGHT BEARING RESTRICTIONS: Yes NWB   FALLS:  Has patient fallen in last 6 months? No  LIVING ENVIRONMENT:  OCCUPATION: Stay at home mom.   Hobbies: work around [employer] doing house rehab, cooking.  PLOF: Independent  PATIENT GOALS:  Functional use of her arm   NEXT MD VISIT:   OBJECTIVE:   DIAGNOSTIC FINDINGS:  Nothing post op PATIENT SURVEYS:  FOTO    COGNITION: Overall cognitive status: Within functional limits for tasks assessed     SENSATION: A little numbness in the hand but it has improved   POSTURE: Good.   UPPER EXTREMITY ROM:   Passive ROM Right 2/20 Left eval  Shoulder flexion 125 No limit  Shoulder extension    Shoulder abduction    Shoulder adduction    Shoulder internal rotation 60 No limit      Shoulder external rotation 25 No limit   Elbow flexion    Elbow extension    Wrist flexion    Wrist extension    Wrist ulnar deviation    Wrist radial deviation    Wrist pronation    Wrist  supination    (Blank rows = not tested)  UPPER EXTREMITY ROM:  Active ROM Right eval Left eval  Shoulder flexion 128 Full   Shoulder extension    Shoulder abduction    Shoulder adduction    Shoulder extension    Shoulder internal rotation L3 with pulling in the front  To scapular border   Shoulder external rotation Can reach behind her head with mild pain    Elbow flexion    Elbow extension    Wrist flexion    Wrist extension    Wrist ulnar deviation    Wrist radial deviation    Wrist pronation    Wrist supination     (Blank rows = not tested)   UPPER EXTREMITY MMT:  MMT Right eval Left eval  Shoulder flexion 4   Shoulder extension    Shoulder abduction    Shoulder adduction    Shoulder internal rotation 4   Shoulder external rotation 4   Middle trapezius    Lower trapezius    Elbow flexion    Elbow extension    Wrist flexion    Wrist extension    Wrist ulnar deviation    Wrist radial deviation    Wrist pronation    Wrist supination    Grip strength (lbs)    (Blank rows = not tested) not tested at this time secondary to recent surgery    PALPATION:  No unexpected tenderness to palpation    TODAY'S TREATMENT:  4/3 Pulley flexion and abduction x2 mins PROM R shoulder  PROM R shoulder all planes; grade I and II PA mobilization; trigger point release to pec;    Supine active flexion 1# 2x10 Supine straight arm ABC 2x 1lb   Side lying ER 2x10    Standing flexion  2x10  Standing abduction 2x10  Row red band 2x10   4/1 PROM R shoulder  Pulley flexion and abduction 2 mins each   Supine active flexion 1# 2x10 Supine straight arm ABC 2x 1lb   Side lying ER 2x10    Standing flexion  2x10  Standing abduction 2x10  3/26 Pulley flexion and abduction 2 mins each   Supine straight arm ABC 2x 1lb  Supine active  flexion 2x10 1lb  Side lying ER 2x10   T band IR 2x10 yellow  Standing flexion 2x10 with thumb up able to go 130 2x10        DATE:  3/22 PROM R shoulder all planes; grade I and II PA mobilization; trigger point release to pec;   Supine straight arm ABC one-time 1lb  Wand flexion 2 x 10 2 pounds  Unable to do extensions with yellow  Row 2x10 red   T band IR 2x10 yellow Supine active flexion 1# 2x10 Supine straight arm ABC 2x 1lb   Side lying ER 2x10    Standing flexion  2x10  Standing abduction 2x10   3/19 PROM R shoulder all planes  Supine straight arm ABC one-time no weight Wand flexion 2 x 10 2 pounds Printed patient's HEP removed some exercises that she has been doing recently reviewed how to do it at home.   Trigger Point Dry-Needling  Treatment instructions: Expect mild to moderate muscle soreness. S/S of pneumothorax if dry needled over a lung field, and to seek immediate medical attention should they occur. Patient verbalized understanding of these instructions and education.  Patient Consent Given: Yes Education handout provided: Yes Muscles treated: sub scap 2x .30x50 needle  Electrical stimulation performed: No Parameters: N/A Treatment response/outcome: none    Manual therapy: Trigger point release to subscapularis and infraspinatus area.  Posterior and inferior glides grade 1 and 2.  Abduction with pec release.    3/15 STM and TPR to lats, teres mm, lateral scapular border- pt in sidelying arm extended rested on [facility] Scapular mobilizations PROM R shoulder all planes  Prone shoulder extension 1x12 Prone horizontal abduction 1x10 with tactile cues for scap retraction Supine flexion 2x10 Prone shoulder extension 2x10 Wall finger climb x5    PATIENT EDUCATION: Education details: HEP, symptom management, progression of activity.  Importance of sling use. Person educated: Patient Education method: Explanation, Demonstration, Tactile cues,  Verbal cues, and Handouts Education comprehension: verbalized understanding, returned demonstration, verbal cues required, tactile cues required, and needs further education  HOME EXERCISE PROGRAM: Access Code: [email or web address] URL: [email or web address] Date: 03/08/2022 Prepared by: Carolyne Littles  Exercises - Reviewed pendulums - PROM into ER and flexion   ASSESSMENT:  CLINICAL IMPRESSION: Therapy performed a re-assessment on the patient today. She Is  making good functional progress. She is using her arm for most tasks at this time. She is still limited reaching up her back. She also has pain with overhead lifting. Her pain continues to be in her anterior shoulder. Her passive ER was measured at 60 degrees today> She was encouraged to continue home ER stretching. We will continue to progress strengthening as tolerated. She would benefit from therapy 2W6 to finalize HEP.  OBJECTIVE IMPAIRMENTS: decreased activity tolerance, decreased mobility, decreased ROM, decreased strength, impaired UE functional use, and pain.   ACTIVITY LIMITATIONS: carrying, lifting, sleeping, bathing, dressing, self feeding, reach over head, and hygiene/grooming  PARTICIPATION LIMITATIONS: meal prep, cleaning, laundry, community activity, occupation, and yard work  PERSONAL FACTORS:none    REHAB POTENTIAL: Excellent  CLINICAL DECISION MAKING: Stable/uncomplicated  EVALUATION COMPLEXITY: Low   GOALS: Goals reviewed with patient? Yes  SHORT TERM GOALS: Target date: 04/06/2022    Patient will increase passive right shoulder flexion to 110 degrees Baseline: Goal status: full passive achieved   2.  Patient will increase passive right shoulder external rotation to 70 degrees Baseline:  Goal status: 60 initial goal met 4/3 goal revised   3.  Patient will be independent with basic HEP following protocol suggestions Baseline:  Goal status: MET 3/12   LONG TERM GOALS: Target date: 05/03/2022  4/3    Patient will reach overhead to cabinet with 2 pound weight without pain by the end of rehab Baseline:  Goal status: can reach without a weight. Progressing   2.  Patient will reach behind her head to do her hair without pain Baseline:  Goal status: mild pain   3.  Patient will reach behind her back without pain Baseline:  Goal status: to L3  PLAN:  PT FREQUENCY: 2x/week  PT DURATION: 6 weeks   PLANNED INTERVENTIONS: Therapeutic exercises, Therapeutic activity, Neuromuscular re-education, Patient/Family education, Self Care, Joint mobilization, Aquatic Therapy, Dry Needling, Cryotherapy, Moist heat, Ultrasound, Ionotophoresis 4mg /ml Dexamethasone, and Manual therapy  PLAN FOR NEXT SESSION: Progress AROM and Romeo Rabon PT DPT  06/08/2022, 2:21 PM   Jaci Standard SPT  06/08/2022  During this treatment session, the therapist was present, participating in and directing the treatment.

## 2022-06-14 ENCOUNTER — Ambulatory Visit (HOSPITAL_BASED_OUTPATIENT_CLINIC_OR_DEPARTMENT_OTHER): Payer: BC Managed Care – PPO

## 2022-06-14 ENCOUNTER — Encounter (HOSPITAL_BASED_OUTPATIENT_CLINIC_OR_DEPARTMENT_OTHER): Payer: Self-pay

## 2022-06-14 DIAGNOSIS — M6281 Muscle weakness (generalized): Secondary | ICD-10-CM | POA: Diagnosis not present

## 2022-06-14 DIAGNOSIS — M25611 Stiffness of right shoulder, not elsewhere classified: Secondary | ICD-10-CM

## 2022-06-14 DIAGNOSIS — G8929 Other chronic pain: Secondary | ICD-10-CM

## 2022-06-14 NOTE — Therapy (Signed)
OUTPATIENT PHYSICAL THERAPY SHOULDER EVALUATION   Patient Name: Destiny Gardner MRN: [id number] DOB:06-01-1965, 57 y.o., female Today's Date: 06/14/2022  END OF SESSION:  PT End of Session - 06/14/22 1330     Visit Number 27    Number of Visits 38    Date for PT Re-Evaluation 07/20/22    PT Start Time 1305    PT Stop Time 1352    PT Time Calculation (min) 47 min    Activity Tolerance Patient tolerated treatment well    Behavior During Therapy [facility] for tasks assessed/performed                      Past Medical History:  Diagnosis Date   Complication of anesthesia    post-op N/V, waking up during rhinoplasty and colonoscopy   Endometriosis    Headache    Hx Occular Migraine   Inflammatory bowel disease    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,now with constipation 2010   PONV (postoperative nausea and vomiting)    Past Surgical History:  Procedure Laterality Date   COLONOSCOPY  05/05/2009   TCS/EGD :noraml stomach,colon,and duodenum   COLONOSCOPY  2020   ESOPHAGOGASTRODUODENOSCOPY  05/05/2009   with TCS   HERNIA REPAIR  2015   Umbilical Hernia   LAPAROTOMY  03/07/1997   endometriosis   MYRINGOTOMY     Tubes,Tympanoplasty   RHINOPLASTY  1989   SHOULDER ARTHROSCOPY WITH ROTATOR CUFF REPAIR AND OPEN BICEPS TENODESIS Right 03/03/2022   Procedure: RIGHT SHOULDER ARTHROSCOPY WITH ROTATOR CUFF REPAIR AND BICEPS TENODESIS;  Surgeon: Huel Cote, MD;  Location: [facility] OR;  Service: Orthopedics;  Laterality: Right;   Tonillectomy  1990   WISDOM TOOTH EXTRACTION     Patient Active Problem List   Diagnosis Date Noted   Tendinitis of right rotator cuff 03/03/2022   Biceps tendinitis of right shoulder 03/03/2022   Cough 05/18/2015   ABDOMINAL PAIN 05/26/2009   CHANGE IN BOWELS 01/27/2009   CONSTIPATION, CHRONIC 12/11/2008   ENDOMETRIOSIS 12/11/2008   IRRITABLE BOWEL SYNDROME, HX OF 12/11/2008    PCP: Onnie Graham FNP   REFERRING PROVIDER: Dr Huel Cote    REFERRING DIAG:  Diagnosis  M75.81 (ICD-10-CM) - Tendinitis of right rotator cuff  RTC repair   THERAPY DIAG:  Stiffness of right shoulder, not elsewhere classified  Chronic right shoulder pain  Muscle weakness (generalized)  Rationale for Evaluation and Treatment: Rehabilitation  ONSET DATE: 03/03/2022  Days since surgery: 103   SUBJECTIVE:  SUBJECTIVE STATEMENT: Pt reports poor HEP compliance while on trip to [place]. No pain at rest. Still has anterior shoulder pain with certain activities.   PERTINENT HISTORY: Ocular Migranes  PAIN:  Are you having pain? Yes: NPRS scale: 2-3/10  Pain location: right anterior shoulder  Pain description: Cramping Aggravating factors: use of the shoulder/ sleeping  Relieving factors: rest, tylenol   PRECAUTIONS: Shoulder follow protocol   WEIGHT BEARING RESTRICTIONS: Yes NWB   FALLS:  Has patient fallen in last 6 months? No  LIVING ENVIRONMENT:  OCCUPATION: Stay at home mom.   Hobbies: work around [employer] doing house rehab, cooking.  PLOF: Independent  PATIENT GOALS:  Functional use of her arm   NEXT MD VISIT:   OBJECTIVE:   DIAGNOSTIC FINDINGS:  Nothing post op PATIENT SURVEYS:  FOTO    COGNITION: Overall cognitive status: Within functional limits for tasks assessed     SENSATION: A little numbness in the hand but it has improved   POSTURE: Good.   UPPER EXTREMITY ROM:   Passive ROM Right 2/20 Left eval  Shoulder flexion 125 No limit  Shoulder extension    Shoulder abduction    Shoulder adduction    Shoulder internal rotation 60 No limit      Shoulder external rotation 25 No limit   Elbow flexion    Elbow extension    Wrist flexion    Wrist extension    Wrist ulnar deviation    Wrist radial deviation    Wrist  pronation    Wrist supination    (Blank rows = not tested)  UPPER EXTREMITY ROM:  Active ROM Right eval Left eval  Shoulder flexion 128 Full   Shoulder extension    Shoulder abduction    Shoulder adduction    Shoulder extension    Shoulder internal rotation L3 with pulling in the front  To scapular border   Shoulder external rotation Can reach behind her head with mild pain    Elbow flexion    Elbow extension    Wrist flexion    Wrist extension    Wrist ulnar deviation    Wrist radial deviation    Wrist pronation    Wrist supination     (Blank rows = not tested)   UPPER EXTREMITY MMT:  MMT Right eval Left eval  Shoulder flexion 4   Shoulder extension    Shoulder abduction    Shoulder adduction    Shoulder internal rotation 4   Shoulder external rotation 4   Middle trapezius    Lower trapezius    Elbow flexion    Elbow extension    Wrist flexion    Wrist extension    Wrist ulnar deviation    Wrist radial deviation    Wrist pronation    Wrist supination    Grip strength (lbs)    (Blank rows = not tested) not tested at this time secondary to recent surgery    PALPATION:  No unexpected tenderness to palpation    TODAY'S TREATMENT:  4/9 Pulley flexion and abduction x2 mins PROM R shoulder  Bicep curls 2# 2x10 Standing chicken wing (1.5# cuff weight around upper arm) 2x10 Bent over triceps extension 2# 2x10 Standing fwd reach-straight out 1# 2x10 Bicep curl to OH press- 1# 3x10 OH ball rolls 2.2# ball- 20ea 4 way  UBE: No resistance, 1min fwd, 1min backwards (fatigued after 2min)     4/3 Pulley flexion and abduction x2 mins PROM R shoulder  PROM R shoulder all planes; grade I and II PA mobilization; trigger point release to pec;    Supine active flexion 1# 2x10 Supine straight arm ABC 2x 1lb   Side lying ER  2x10    Standing flexion  2x10  Standing abduction 2x10  Row red band 2x10   4/1 PROM R shoulder  Pulley flexion and abduction 2 mins each   Supine active flexion 1# 2x10 Supine straight arm ABC 2x 1lb   Side lying ER 2x10    Standing flexion  2x10  Standing abduction 2x10  3/26 Pulley flexion and abduction 2 mins each   Supine straight arm ABC 2x 1lb  Supine active flexion 2x10 1lb  Side lying ER 2x10   T band IR 2x10 yellow  Standing flexion 2x10 with thumb up able to go 130 2x10        DATE:  3/22 PROM R shoulder all planes; grade I and II PA mobilization; trigger point release to pec;   Supine straight arm ABC one-time 1lb  Wand flexion 2 x 10 2 pounds  Unable to do extensions with yellow  Row 2x10 red   T band IR 2x10 yellow Supine active flexion 1# 2x10 Supine straight arm ABC 2x 1lb   Side lying ER 2x10    Standing flexion  2x10  Standing abduction 2x10  PATIENT EDUCATION: Education details: HEP, symptom management, progression of activity.  Importance of sling use. Person educated: Patient Education method: Explanation, Demonstration, Tactile cues, Verbal cues, and Handouts Education comprehension: verbalized understanding, returned demonstration, verbal cues required, tactile cues required, and needs further education  HOME EXERCISE PROGRAM: Access Code: [email or web address] URL: [email or web address] Date: 03/08/2022 Prepared by: Lorayne Benderavid Carroll  Exercises - Reviewed pendulums - PROM into ER and flexion   ASSESSMENT:  CLINICAL IMPRESSION: Pt able to progress with strengthening today with good tolerance. She does continue with anterior shoulder discomfort with passive shoulder flexion, palpation, and fwd reaching, but it did not prevent her from completing her exercises today. Pt demonstrates difficulty with endurance and strength with progressions, requiring multiple rest breaks. With UBE today, she fatigued very quickly after 2  minutes. Updated and reviewed HEP with pt. She will benefit from continued strengthening.   OBJECTIVE IMPAIRMENTS: decreased activity tolerance, decreased mobility, decreased ROM, decreased strength, impaired UE functional use, and pain.   ACTIVITY LIMITATIONS: carrying, lifting, sleeping, bathing, dressing, self feeding, reach over head, and hygiene/grooming  PARTICIPATION LIMITATIONS: meal prep, cleaning, laundry, community activity, occupation, and yard work  PERSONAL FACTORS:none    REHAB POTENTIAL: Excellent  CLINICAL DECISION MAKING: Stable/uncomplicated  EVALUATION COMPLEXITY: Low   GOALS: Goals reviewed with patient? Yes  SHORT TERM GOALS: Target date: 04/06/2022    Patient will increase passive right shoulder flexion to 110 degrees Baseline: Goal status: full passive achieved   2.  Patient will increase passive right shoulder external rotation to 70 degrees Baseline:  Goal status: 60 initial goal met 4/3 goal revised   3.  Patient will be independent with basic HEP following protocol suggestions Baseline:  Goal status: MET 3/12   LONG TERM GOALS: Target date: 05/03/2022 4/3    Patient will reach overhead to cabinet with 2 pound weight without pain by the end of rehab Baseline:  Goal status: can reach without a weight. Progressing   2.  Patient will reach behind her head to do her hair without pain Baseline:  Goal status: mild pain   3.  Patient will reach behind her back without pain Baseline:  Goal status: to L3  PLAN:  PT FREQUENCY: 2x/week  PT DURATION: 6 weeks   PLANNED INTERVENTIONS: Therapeutic exercises, Therapeutic activity, Neuromuscular re-education, Patient/Family education, Self Care, Joint mobilization, Aquatic Therapy, Dry Needling, Cryotherapy, Moist heat, Ultrasound, Ionotophoresis 4mg /ml Dexamethasone, and Manual therapy  PLAN FOR NEXT SESSION: Progress AROM and AAROM Nalina Yeatman, PTA  06/14/2022, 2:10 PM

## 2022-06-15 ENCOUNTER — Ambulatory Visit (INDEPENDENT_AMBULATORY_CARE_PROVIDER_SITE_OTHER): Payer: BC Managed Care – PPO | Admitting: Orthopaedic Surgery

## 2022-06-15 ENCOUNTER — Ambulatory Visit (HOSPITAL_BASED_OUTPATIENT_CLINIC_OR_DEPARTMENT_OTHER): Payer: BC Managed Care – PPO | Admitting: Orthopaedic Surgery

## 2022-06-15 DIAGNOSIS — M7581 Other shoulder lesions, right shoulder: Secondary | ICD-10-CM

## 2022-06-15 NOTE — Progress Notes (Signed)
Post Operative Evaluation    Procedure/Date of Surgery: Right shoulder rotator cuff repair biceps tenodesis 03/03/22  Interval History:    Destiny Gardner presents today for follow-up 3-1/50-month status post right shoulder rotator cuff repair biceps tenodesis.  Overall she is still experiencing tenderness and pain about the right shoulder this is particularly in certain activities where she will get a sharp pain in the front of the shoulder.  She is working through strengthening of the shoulder.  This is continuing to progress. 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,now with constipation 2010   PONV (postoperative nausea and vomiting)    Past Surgical History:  Procedure Laterality Date   COLONOSCOPY  05/05/2009   TCS/EGD :noraml stomach,colon,and duodenum   COLONOSCOPY  2020   ESOPHAGOGASTRODUODENOSCOPY  05/05/2009   with TCS   HERNIA REPAIR  2015   Umbilical Hernia   LAPAROTOMY  03/07/1997   endometriosis   MYRINGOTOMY     Tubes,Tympanoplasty   RHINOPLASTY  1989   SHOULDER ARTHROSCOPY WITH ROTATOR CUFF REPAIR AND OPEN BICEPS TENODESIS Right 03/03/2022   Procedure: RIGHT SHOULDER ARTHROSCOPY WITH ROTATOR CUFF REPAIR AND BICEPS TENODESIS;  Surgeon: Huel Cote, MD;  Location: [facility] OR;  Service: Orthopedics;  Laterality: Right;   Tonillectomy  1990   WISDOM TOOTH EXTRACTION     Social History   Socioeconomic History   Marital status: Married    Spouse name: Not on file   Number of children: Not on file   Years of education: Not on file   Highest education level: Not on file  Occupational History   Not on file  Tobacco Use   Smoking status: Never   Smokeless tobacco: Never   Substance and Sexual Activity   Alcohol use: Yes    Alcohol/week: 0.0 standard drinks of alcohol    Comment: social   Drug use: No   Sexual activity: Yes  Other Topics Concern   Not on file  Social History Narrative   Not on file   Social Determinants of Health   Financial Resource Strain: Not on file  Food Insecurity: Not on file  Transportation Needs: Not on file  Physical Activity: Not on file  Stress: Not on file  Social Connections: Not on file   Family History  Problem Relation Age of Onset   COPD Mother        never smoker   Prostate cancer Father    Allergies  Allergen Reactions   Codeine Nausea And Vomiting   Ketorolac Tromethamine    Penicillins Hives   Chlorhexidine Rash   Current Outpatient Medications  Medication Sig Dispense Refill   Ascorbic Acid (VITAMIN C PO) Take 1 tablet by mouth daily.     aspirin EC 325 MG tablet Take 1 tablet (325 mg total) by mouth daily. 30 tablet 0   methylPREDNISolone (MEDROL DOSEPAK) 4 MG TBPK tablet Take per packet instructions 21 each 0   oxyCODONE (OXY IR/ROXICODONE) 5 MG immediate release tablet Take 1 tablet (5 mg total) by mouth every 4 (four) hours as needed (severe  pain). 10 tablet 0   No current facility-administered medications for this visit.   No results found.  Review of Systems:   A ROS was performed including pertinent positives and negatives as documented in the HPI.   Musculoskeletal Exam:      Right shoulder incisions are well-appearing without erythema or drainage.  In the supine position she is able to forward elevate to approximately 90 degrees with external rotation at the side 20 degrees.  There is stabbing in the glenohumeral joint.  These are both somewhat painful.  Active forward elevation is to only 40 degrees with pain.  Internal rotation deferred today.   Imaging:      I personally reviewed and interpreted the radiographs.   Assessment:   12 weeks status post right shoulder rotator  cuff repair.  Overall she does appear to be having some anterior overload in the setting of posterior scapular weakness with resultant biceps type tendinitis at the pectoral insertion.  I do believe that this will ultimately improve or go away as she is able to strengthen the posterior chain.  I would like the majority of her physical therapy to focus on strengthening particularly of this posterior chain to improve her scapular kinetics.  I will plan to see her back in 8 weeks for reassessment Plan :    -Return to clinic 8 weeks for reassessment      I personally saw and evaluated the patient, and participated in the management and treatment plan.  Huel Cote, MD Attending Physician, Orthopedic Surgery  This document was dictated using Dragon voice recognition software. A reasonable attempt at proof reading has been made to minimize errors.

## 2022-06-16 ENCOUNTER — Encounter (HOSPITAL_BASED_OUTPATIENT_CLINIC_OR_DEPARTMENT_OTHER): Payer: BC Managed Care – PPO

## 2022-06-17 ENCOUNTER — Ambulatory Visit (HOSPITAL_BASED_OUTPATIENT_CLINIC_OR_DEPARTMENT_OTHER): Payer: BC Managed Care – PPO | Admitting: Physical Therapy

## 2022-06-17 ENCOUNTER — Encounter (HOSPITAL_BASED_OUTPATIENT_CLINIC_OR_DEPARTMENT_OTHER): Payer: Self-pay | Admitting: Physical Therapy

## 2022-06-17 DIAGNOSIS — M25611 Stiffness of right shoulder, not elsewhere classified: Secondary | ICD-10-CM

## 2022-06-17 DIAGNOSIS — M25521 Pain in right elbow: Secondary | ICD-10-CM

## 2022-06-17 DIAGNOSIS — M6281 Muscle weakness (generalized): Secondary | ICD-10-CM | POA: Diagnosis not present

## 2022-06-17 DIAGNOSIS — G8929 Other chronic pain: Secondary | ICD-10-CM

## 2022-06-17 NOTE — Therapy (Signed)
OUTPATIENT PHYSICAL THERAPY SHOULDER EVALUATION   Patient Name: Destiny Gardner MRN: [id number] DOB:06-01-1965, 57 y.o., female Today's Date: 06/14/2022  END OF SESSION:  PT End of Session - 06/14/22 1330     Visit Number 27    Number of Visits 38    Date for PT Re-Evaluation 07/20/22    PT Start Time 1305    PT Stop Time 1352    PT Time Calculation (min) 47 min    Activity Tolerance Patient tolerated treatment well    Behavior During Therapy [facility] for tasks assessed/performed                      Past Medical History:  Diagnosis Date   Complication of anesthesia    post-op N/V, waking up during rhinoplasty and colonoscopy   Endometriosis    Headache    Hx Occular Migraine   Inflammatory bowel disease    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,now with constipation 2010   PONV (postoperative nausea and vomiting)    Past Surgical History:  Procedure Laterality Date   COLONOSCOPY  05/05/2009   TCS/EGD :noraml stomach,colon,and duodenum   COLONOSCOPY  2020   ESOPHAGOGASTRODUODENOSCOPY  05/05/2009   with TCS   HERNIA REPAIR  2015   Umbilical Hernia   LAPAROTOMY  03/07/1997   endometriosis   MYRINGOTOMY     Tubes,Tympanoplasty   RHINOPLASTY  1989   SHOULDER ARTHROSCOPY WITH ROTATOR CUFF REPAIR AND OPEN BICEPS TENODESIS Right 03/03/2022   Procedure: RIGHT SHOULDER ARTHROSCOPY WITH ROTATOR CUFF REPAIR AND BICEPS TENODESIS;  Surgeon: Huel Cote, MD;  Location: [facility] OR;  Service: Orthopedics;  Laterality: Right;   Tonillectomy  1990   WISDOM TOOTH EXTRACTION     Patient Active Problem List   Diagnosis Date Noted   Tendinitis of right rotator cuff 03/03/2022   Biceps tendinitis of right shoulder 03/03/2022   Cough 05/18/2015   ABDOMINAL PAIN 05/26/2009   CHANGE IN BOWELS 01/27/2009   CONSTIPATION, CHRONIC 12/11/2008   ENDOMETRIOSIS 12/11/2008   IRRITABLE BOWEL SYNDROME, HX OF 12/11/2008    PCP: Onnie Graham FNP   REFERRING PROVIDER: Dr Huel Cote    REFERRING DIAG:  Diagnosis  M75.81 (ICD-10-CM) - Tendinitis of right rotator cuff  RTC repair   THERAPY DIAG:  Stiffness of right shoulder, not elsewhere classified  Chronic right shoulder pain  Muscle weakness (generalized)  Rationale for Evaluation and Treatment: Rehabilitation  ONSET DATE: 03/03/2022  Days since surgery: 103   SUBJECTIVE:  SUBJECTIVE STATEMENT: The patient has been to the MD.  He wants [place] to focus more on posterior chain strengthening.   PERTINENT HISTORY: Ocular Migranes  PAIN:  Are you having pain? Yes: NPRS scale: 2-3/10  Pain location: right anterior shoulder  Pain description: Cramping Aggravating factors: use of the shoulder/ sleeping  Relieving factors: rest, tylenol   PRECAUTIONS: Shoulder follow protocol   WEIGHT BEARING RESTRICTIONS: Yes NWB   FALLS:  Has patient fallen in last 6 months? No  LIVING ENVIRONMENT:  OCCUPATION: Stay at home mom.   Hobbies: work around [employer] doing house rehab, cooking.  PLOF: Independent  PATIENT GOALS:  Functional use of her arm   NEXT MD VISIT:   OBJECTIVE:   DIAGNOSTIC FINDINGS:  Nothing post op PATIENT SURVEYS:  FOTO    COGNITION: Overall cognitive status: Within functional limits for tasks assessed     SENSATION: A little numbness in the hand but it has improved   POSTURE: Good.   UPPER EXTREMITY ROM:   Passive ROM Right 2/20 Left eval  Shoulder flexion 125 No limit  Shoulder extension    Shoulder abduction    Shoulder adduction    Shoulder internal rotation 60 No limit      Shoulder external rotation 25 No limit   Elbow flexion    Elbow extension    Wrist flexion    Wrist extension    Wrist ulnar deviation    Wrist radial deviation    Wrist pronation    Wrist supination     (Blank rows = not tested)  UPPER EXTREMITY ROM:  Active ROM Right eval Left eval  Shoulder flexion 128 Full   Shoulder extension    Shoulder abduction    Shoulder adduction    Shoulder extension    Shoulder internal rotation L3 with pulling in the front  To scapular border   Shoulder external rotation Can reach behind her head with mild pain    Elbow flexion    Elbow extension    Wrist flexion    Wrist extension    Wrist ulnar deviation    Wrist radial deviation    Wrist pronation    Wrist supination     (Blank rows = not tested)   UPPER EXTREMITY MMT:  MMT Right eval Left eval  Shoulder flexion 4   Shoulder extension    Shoulder abduction    Shoulder adduction    Shoulder internal rotation 4   Shoulder external rotation 4   Middle trapezius    Lower trapezius    Elbow flexion    Elbow extension    Wrist flexion    Wrist extension    Wrist ulnar deviation    Wrist radial deviation    Wrist pronation    Wrist supination    Grip strength (lbs)    (Blank rows = not tested) not tested at this time secondary to recent surgery    PALPATION:  No unexpected tenderness to palpation    TODAY'S TREATMENT:  4/12 Pulley flexion and abduction x2 mins PROM R shoulder all planes; grade I and II PA mobilization; trigger point release to pec;  SL ER 2x15 1lb  ABC 2lb and 1lb   Prone:  Row 1 lbs 2x15  W 1 lb 2x15 Extension 2x15 1 lb   Band row 2x15 green     4/9 Pulley flexion and abduction x2 mins PROM R shoulder  Bicep curls 2# 2x10 Standing chicken wing (1.5# cuff weight around upper arm) 2x10 Bent over triceps extension 2# 2x10 Standing fwd reach-straight out 1# 2x10 Bicep curl to OH press- 1# 3x10 OH ball rolls 2.2# ball- 20ea 4 way  UBE: No resistance, fwd, backwards (fatigued after  )     4/3 Pulley flexion and abduction x2 mins PROM R shoulder  PROM R shoulder all planes; grade I and II PA mobilization; trigger point release to pec;    Supine active flexion 1# 2x10 Supine straight arm ABC 2x 1lb   Side lying ER 2x10    Standing flexion  2x10  Standing abduction 2x10  Row red band 2x10   4/1 PROM R shoulder  Pulley flexion and abduction 2 mins each   Supine active flexion 1# 2x10 Supine straight arm ABC 2x 1lb   Side lying ER 2x10    Standing flexion  2x10  Standing abduction 2x10  3/26 Pulley flexion and abduction 2 mins each   Supine straight arm ABC 2x 1lb  Supine active flexion 2x10 1lb  Side lying ER 2x10   T band IR 2x10 yellow  Standing flexion 2x10 with thumb up able to go 130 2x10        DATE:  3/22 PROM R shoulder all planes; grade I and II PA mobilization; trigger point release to pec;   Supine straight arm ABC one-time 1lb  Wand flexion 2 x 10 2 pounds  Unable to do extensions with yellow  Row 2x10 red   T band IR 2x10 yellow Supine active flexion 1# 2x10 Supine straight arm ABC 2x 1lb   Side lying ER 2x10    Standing flexion  2x10  Standing abduction 2x10  PATIENT EDUCATION: Education details: HEP, symptom management, progression of activity.  Importance of sling use. Person educated: Patient Education method: Explanation, Demonstration, Tactile cues, Verbal cues, and Handouts Education comprehension: verbalized understanding, returned demonstration, verbal cues required, tactile cues required, and needs further education  HOME EXERCISE PROGRAM: Access Code: [email or web address] URL: [email or web address] Date: 03/08/2022 Prepared by: Lorayne Bender  Exercises - Reviewed pendulums - PROM into ER and flexion   ASSESSMENT:  CLINICAL IMPRESSION: Per MD note, therapy focused more on posterior chain exercises. We held on overhead activity. We will trial this for 2 weeks and see how the patient  responds.   OBJECTIVE IMPAIRMENTS: decreased activity tolerance, decreased mobility, decreased ROM, decreased strength, impaired UE functional use, and pain.   ACTIVITY LIMITATIONS: carrying, lifting, sleeping, bathing, dressing, self feeding, reach over head, and hygiene/grooming  PARTICIPATION LIMITATIONS: meal prep, cleaning, laundry, community activity, occupation, and yard work  PERSONAL FACTORS:none    REHAB POTENTIAL: Excellent  CLINICAL DECISION MAKING: Stable/uncomplicated  EVALUATION COMPLEXITY: Low   GOALS: Goals reviewed with patient? Yes  SHORT TERM GOALS: Target date: 04/06/2022    Patient will increase passive right shoulder flexion to 110 degrees Baseline: Goal status: full passive achieved   2.  Patient will increase passive right shoulder external rotation to 70 degrees Baseline:  Goal status: 60 initial goal met 4/3  goal revised   3.  Patient will be independent with basic HEP following protocol suggestions Baseline:  Goal status: MET 3/12   LONG TERM GOALS: Target date: 05/03/2022 4/3    Patient will reach overhead to cabinet with 2 pound weight without pain by the end of rehab Baseline:  Goal status: can reach without a weight. Progressing   2.  Patient will reach behind her head to do her hair without pain Baseline:  Goal status: mild pain   3.  Patient will reach behind her back without pain Baseline:  Goal status: to L3  PLAN:  PT FREQUENCY: 2x/week  PT DURATION: 6 weeks   PLANNED INTERVENTIONS: Therapeutic exercises, Therapeutic activity, Neuromuscular re-education, Patient/Family education, Self Care, Joint mobilization, Aquatic Therapy, Dry Needling, Cryotherapy, Moist heat, Ultrasound, Ionotophoresis 4mg /ml Dexamethasone, and Manual therapy  PLAN FOR NEXT SESSION: Progress AROM and AAROM  Lorayne Bender PT DPT   06/14/2022, 2:10 PM

## 2022-06-21 ENCOUNTER — Encounter (HOSPITAL_BASED_OUTPATIENT_CLINIC_OR_DEPARTMENT_OTHER): Payer: Self-pay | Admitting: Physical Therapy

## 2022-06-21 ENCOUNTER — Ambulatory Visit (HOSPITAL_BASED_OUTPATIENT_CLINIC_OR_DEPARTMENT_OTHER): Payer: BC Managed Care – PPO | Admitting: Physical Therapy

## 2022-06-21 DIAGNOSIS — M25611 Stiffness of right shoulder, not elsewhere classified: Secondary | ICD-10-CM

## 2022-06-21 DIAGNOSIS — M25521 Pain in right elbow: Secondary | ICD-10-CM

## 2022-06-21 DIAGNOSIS — M6281 Muscle weakness (generalized): Secondary | ICD-10-CM | POA: Diagnosis not present

## 2022-06-21 DIAGNOSIS — G8929 Other chronic pain: Secondary | ICD-10-CM

## 2022-06-21 NOTE — Therapy (Signed)
OUTPATIENT PHYSICAL THERAPY SHOULDER EVALUATION   Patient Name: Destiny Gardner MRN: [id number] DOB:06-01-1965, 57 y.o., female Today's Date: 06/14/2022  END OF SESSION:  PT End of Session - 06/14/22 1330     Visit Number 27    Number of Visits 38    Date for PT Re-Evaluation 07/20/22    PT Start Time 1305    PT Stop Time 1352    PT Time Calculation (min) 47 min    Activity Tolerance Patient tolerated treatment well    Behavior During Therapy [facility] for tasks assessed/performed                      Past Medical History:  Diagnosis Date   Complication of anesthesia    post-op N/V, waking up during rhinoplasty and colonoscopy   Endometriosis    Headache    Hx Occular Migraine   Inflammatory bowel disease    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,now with constipation 2010   PONV (postoperative nausea and vomiting)    Past Surgical History:  Procedure Laterality Date   COLONOSCOPY  05/05/2009   TCS/EGD :noraml stomach,colon,and duodenum   COLONOSCOPY  2020   ESOPHAGOGASTRODUODENOSCOPY  05/05/2009   with TCS   HERNIA REPAIR  2015   Umbilical Hernia   LAPAROTOMY  03/07/1997   endometriosis   MYRINGOTOMY     Tubes,Tympanoplasty   RHINOPLASTY  1989   SHOULDER ARTHROSCOPY WITH ROTATOR CUFF REPAIR AND OPEN BICEPS TENODESIS Right 03/03/2022   Procedure: RIGHT SHOULDER ARTHROSCOPY WITH ROTATOR CUFF REPAIR AND BICEPS TENODESIS;  Surgeon: Huel Cote, MD;  Location: [facility] OR;  Service: Orthopedics;  Laterality: Right;   Tonillectomy  1990   WISDOM TOOTH EXTRACTION     Patient Active Problem List   Diagnosis Date Noted   Tendinitis of right rotator cuff 03/03/2022   Biceps tendinitis of right shoulder 03/03/2022   Cough 05/18/2015   ABDOMINAL PAIN 05/26/2009   CHANGE IN BOWELS 01/27/2009   CONSTIPATION, CHRONIC 12/11/2008   ENDOMETRIOSIS 12/11/2008   IRRITABLE BOWEL SYNDROME, HX OF 12/11/2008    PCP: Onnie Graham FNP   REFERRING PROVIDER: Dr Huel Cote    REFERRING DIAG:  Diagnosis  M75.81 (ICD-10-CM) - Tendinitis of right rotator cuff  RTC repair   THERAPY DIAG:  Stiffness of right shoulder, not elsewhere classified  Chronic right shoulder pain  Muscle weakness (generalized)  Rationale for Evaluation and Treatment: Rehabilitation  ONSET DATE: 03/03/2022  Days since surgery: 103   SUBJECTIVE:  SUBJECTIVE STATEMENT: The patient is not having any pain when she isn't using it She reports it is about the same reaching overhead.   PERTINENT HISTORY: Ocular Migranes  PAIN:  Are you having pain? Yes: NPRS scale: 2-3/10  Pain location: right anterior shoulder  Pain description: Cramping Aggravating factors: use of the shoulder/ sleeping  Relieving factors: rest, tylenol   PRECAUTIONS: Shoulder follow protocol   WEIGHT BEARING RESTRICTIONS: Yes NWB   FALLS:  Has patient fallen in last 6 months? No  LIVING ENVIRONMENT:  OCCUPATION: Stay at home mom.   Hobbies: work around [employer] doing house rehab, cooking.  PLOF: Independent  PATIENT GOALS:  Functional use of her arm   NEXT MD VISIT:   OBJECTIVE:   DIAGNOSTIC FINDINGS:  Nothing post op PATIENT SURVEYS:  FOTO    COGNITION: Overall cognitive status: Within functional limits for tasks assessed     SENSATION: A little numbness in the hand but it has improved   POSTURE: Good.   UPPER EXTREMITY ROM:   Passive ROM Right 2/20 Left eval  Shoulder flexion 125 No limit  Shoulder extension    Shoulder abduction    Shoulder adduction    Shoulder internal rotation 60 No limit      Shoulder external rotation 25 No limit   Elbow flexion    Elbow extension    Wrist flexion    Wrist extension    Wrist ulnar deviation    Wrist radial deviation    Wrist pronation    Wrist  supination    (Blank rows = not tested)  UPPER EXTREMITY ROM:  Active ROM Right eval Left eval  Shoulder flexion 128 Full   Shoulder extension    Shoulder abduction    Shoulder adduction    Shoulder extension    Shoulder internal rotation L3 with pulling in the front  To scapular border   Shoulder external rotation Can reach behind her head with mild pain    Elbow flexion    Elbow extension    Wrist flexion    Wrist extension    Wrist ulnar deviation    Wrist radial deviation    Wrist pronation    Wrist supination     (Blank rows = not tested)   UPPER EXTREMITY MMT:  MMT Right eval Left eval  Shoulder flexion 4   Shoulder extension    Shoulder abduction    Shoulder adduction    Shoulder internal rotation 4   Shoulder external rotation 4   Middle trapezius    Lower trapezius    Elbow flexion    Elbow extension    Wrist flexion    Wrist extension    Wrist ulnar deviation    Wrist radial deviation    Wrist pronation    Wrist supination    Grip strength (lbs)    (Blank rows = not tested) not tested at this time secondary to recent surgery    PALPATION:  No unexpected tenderness to palpation    TODAY'S TREATMENT:  4/16 PROM R shoulder all planes; grade I and II PA mobilization; trigger point release to pec; Bicep curls 2# 2x10  SL ER 2x10 1lb and 2lbs RPE of 5-6 with 2 and 3-4 at 1  ABC 1lb RPE 2 will advace next visit   Prone Row 2x15 2 lbs  Prone extension 2x15 1 lbs  and 2lbs  Prone W 2x15   Bicpes curl 2x15         4/9 Pulley flexion and abduction x2 mins PROM R shoulder  Bicep curls 2# 2x10 Standing chicken wing (1.5# cuff weight around upper arm) 2x10 Bent over triceps extension 2# 2x10 Standing fwd reach-straight out 1# 2x10 Bicep curl to OH press- 1# 3x10 OH ball rolls 2.2# ball- 20ea 4 way  UBE:  No resistance, fwd, backwards (fatigued after )     4/3 Pulley flexion and abduction x2 mins PROM R shoulder  PROM R shoulder all planes; grade I and II PA mobilization; trigger point release to pec;    Supine active flexion 1# 2x10 Supine straight arm ABC 2x 1lb   Side lying ER 2x10    Standing flexion  2x10  Standing abduction 2x10  Row red band 2x10   4/1 PROM R shoulder  Pulley flexion and abduction 2 mins each   Supine active flexion 1# 2x10 Supine straight arm ABC 2x 1lb   Side lying ER 2x10    Standing flexion  2x10  Standing abduction 2x10  3/26 Pulley flexion and abduction 2 mins each   Supine straight arm ABC 2x 1lb  Supine active flexion 2x10 1lb  Side lying ER 2x10   T band IR 2x10 yellow  Standing flexion 2x10 with thumb up able to go 130 2x10        DATE:  4/16 PROM R shoulder all planes; grade I and II PA mobilization; trigger point release to pec;   Supine straight arm ABC one-time 1lb  Wand flexion 2 x 10 2 pounds  3/22 PROM R shoulder all planes; grade I and II PA mobilization; trigger point release to pec;   Supine straight arm ABC one-time 1lb  Wand flexion 2 x 10 2 pounds  Unable to do extensions with yellow  Row 2x10 red   T band IR 2x10 yellow Supine active flexion 1# 2x10 Supine straight arm ABC 2x 1lb   Side lying ER 2x10    Standing flexion  2x10  Standing abduction 2x10  PATIENT EDUCATION: Education details: HEP, symptom management, progression of activity.  Importance of sling use. Person educated: Patient Education method: Explanation, Demonstration, Tactile cues, Verbal cues, and Handouts Education comprehension: verbalized understanding, returned demonstration, verbal cues required, tactile cues required, and needs further education  HOME EXERCISE PROGRAM: Access Code: [email or web address] URL: [email or web address] Date: 03/08/2022 Prepared by: Lorayne Bender  Exercises - Reviewed  pendulums - PROM into ER and flexion   ASSESSMENT:  CLINICAL IMPRESSION: The patient had full ER by the end of her visit. She was shown the star gazer stretch to help maintain end range at home. She tolerated her exercises well. We continue to not work over head for a few weeks. It appears to have helped her ER motion. We made sure that she was at an RPE of 5-6 with her exercises. We will continue to progress as tolerated.  OBJECTIVE IMPAIRMENTS: decreased activity tolerance, decreased mobility, decreased ROM, decreased strength, impaired UE functional use, and pain.   ACTIVITY LIMITATIONS: carrying, lifting, sleeping, bathing, dressing, self feeding,  reach over head, and hygiene/grooming  PARTICIPATION LIMITATIONS: meal prep, cleaning, laundry, community activity, occupation, and yard work  PERSONAL FACTORS:none    REHAB POTENTIAL: Excellent  CLINICAL DECISION MAKING: Stable/uncomplicated  EVALUATION COMPLEXITY: Low   GOALS: Goals reviewed with patient? Yes  SHORT TERM GOALS: Target date: 04/06/2022    Patient will increase passive right shoulder flexion to 110 degrees Baseline: Goal status: full passive achieved   2.  Patient will increase passive right shoulder external rotation to 70 degrees Baseline:  Goal status: 60 initial goal met 4/3 goal revised   3.  Patient will be independent with basic HEP following protocol suggestions Baseline:  Goal status: MET 3/12   LONG TERM GOALS: Target date: 05/03/2022 4/3    Patient will reach overhead to cabinet with 2 pound weight without pain by the end of rehab Baseline:  Goal status: can reach without a weight. Progressing   2.  Patient will reach behind her head to do her hair without pain Baseline:  Goal status: mild pain   3.  Patient will reach behind her back without pain Baseline:  Goal status: to L3  PLAN:  PT FREQUENCY: 2x/week  PT DURATION: 6 weeks   PLANNED INTERVENTIONS: Therapeutic exercises,  Therapeutic activity, Neuromuscular re-education, Patient/Family education, Self Care, Joint mobilization, Aquatic Therapy, Dry Needling, Cryotherapy, Moist heat, Ultrasound, Ionotophoresis /ml Dexamethasone, and Manual therapy  PLAN FOR NEXT SESSION: Progress AROM and AAROM  Lorayne Bender PT DPT   06/14/2022, 2:10 PM

## 2022-06-24 ENCOUNTER — Ambulatory Visit (HOSPITAL_BASED_OUTPATIENT_CLINIC_OR_DEPARTMENT_OTHER): Payer: BC Managed Care – PPO | Admitting: Physical Therapy

## 2022-06-24 DIAGNOSIS — M6281 Muscle weakness (generalized): Secondary | ICD-10-CM | POA: Diagnosis not present

## 2022-06-24 DIAGNOSIS — M25521 Pain in right elbow: Secondary | ICD-10-CM

## 2022-06-24 DIAGNOSIS — G8929 Other chronic pain: Secondary | ICD-10-CM

## 2022-06-24 DIAGNOSIS — M25611 Stiffness of right shoulder, not elsewhere classified: Secondary | ICD-10-CM

## 2022-06-24 NOTE — Therapy (Signed)
OUTPATIENT PHYSICAL THERAPY SHOULDER EVALUATION   Patient Name: Destiny Gardner MRN: [id number] DOB:13-Jun-1965, 57 y.o., female Today's Date: 06/24/2022  END OF SESSION:  PT End of Session - 06/24/22 1526     Visit Number 30    Number of Visits 38    PT Start Time 0315    Activity Tolerance Patient tolerated treatment well    Behavior During Therapy [facility] for tasks assessed/performed                      Past Medical History:  Diagnosis Date   Complication of anesthesia    post-op N/V, waking up during rhinoplasty and colonoscopy   Endometriosis    Headache    Hx Occular Migraine   Inflammatory bowel disease    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,now with constipation 2010   PONV (postoperative nausea and vomiting)    Past Surgical History:  Procedure Laterality Date   COLONOSCOPY  05/05/2009   TCS/EGD :noraml stomach,colon,and duodenum   COLONOSCOPY  2020   ESOPHAGOGASTRODUODENOSCOPY  05/05/2009   with TCS   HERNIA REPAIR  2015   Umbilical Hernia   LAPAROTOMY  03/07/1997   endometriosis   MYRINGOTOMY     Tubes,Tympanoplasty   RHINOPLASTY  1989   SHOULDER ARTHROSCOPY WITH ROTATOR CUFF REPAIR AND OPEN BICEPS TENODESIS Right 03/03/2022   Procedure: RIGHT SHOULDER ARTHROSCOPY WITH ROTATOR CUFF REPAIR AND BICEPS TENODESIS;  Surgeon: Huel Cote, MD;  Location: [facility] OR;  Service: Orthopedics;  Laterality: Right;   Tonillectomy  1990   WISDOM TOOTH EXTRACTION     Patient Active Problem List   Diagnosis Date Noted   Tendinitis of right rotator cuff 03/03/2022   Biceps tendinitis of right shoulder 03/03/2022   Cough 05/18/2015   ABDOMINAL PAIN 05/26/2009   CHANGE IN BOWELS 01/27/2009   CONSTIPATION, CHRONIC 12/11/2008   ENDOMETRIOSIS 12/11/2008   IRRITABLE BOWEL SYNDROME, HX OF 12/11/2008    PCP: Onnie Graham FNP   REFERRING PROVIDER: Dr Huel Cote   REFERRING DIAG:  Diagnosis  M75.81 (ICD-10-CM) - Tendinitis of right rotator cuff  RTC  repair   THERAPY DIAG:  No diagnosis found.  Rationale for Evaluation and Treatment: Rehabilitation  ONSET DATE: 03/03/2022  Days since surgery: 113   SUBJECTIVE:                                                                                                                                                                                      SUBJECTIVE STATEMENT: Patient reports with no pain at entry of therapy. Patient reports cleaning the house and doing laundry this morning but was mainly using her left arm and  right arm as needed. Patient still reports some pinching at end range of shoulder flexion.   PERTINENT HISTORY: Ocular Migranes  PAIN:  Are you having pain? Yes: NPRS scale: 2-3/10  Pain location: right anterior shoulder  Pain description: Cramping Aggravating factors: use of the shoulder/ sleeping  Relieving factors: rest, tylenol   PRECAUTIONS: Shoulder follow protocol   WEIGHT BEARING RESTRICTIONS: Yes NWB   FALLS:  Has patient fallen in last 6 months? No  LIVING ENVIRONMENT:  OCCUPATION: Stay at home mom.   Hobbies: work around [employer] doing house rehab, cooking.  PLOF: Independent  PATIENT GOALS:  Functional use of her arm   NEXT MD VISIT:   OBJECTIVE:   DIAGNOSTIC FINDINGS:  Nothing post op PATIENT SURVEYS:  FOTO    COGNITION: Overall cognitive status: Within functional limits for tasks assessed     SENSATION: A little numbness in the hand but it has improved   POSTURE: Good.   UPPER EXTREMITY ROM:   Passive ROM Right 2/20 Left eval  Shoulder flexion 125 No limit  Shoulder extension    Shoulder abduction    Shoulder adduction    Shoulder internal rotation 60 No limit      Shoulder external rotation 25 No limit   Elbow flexion    Elbow extension    Wrist flexion    Wrist extension    Wrist ulnar deviation    Wrist radial deviation    Wrist pronation    Wrist supination    (Blank rows = not tested)  UPPER EXTREMITY  ROM:  Active ROM Right eval Left eval  Shoulder flexion 128 Full   Shoulder extension    Shoulder abduction    Shoulder adduction    Shoulder extension    Shoulder internal rotation L3 with pulling in the front  To scapular border   Shoulder external rotation Can reach behind her head with mild pain    Elbow flexion    Elbow extension    Wrist flexion    Wrist extension    Wrist ulnar deviation    Wrist radial deviation    Wrist pronation    Wrist supination     (Blank rows = not tested)   UPPER EXTREMITY MMT:  MMT Right eval Left eval  Shoulder flexion 4   Shoulder extension    Shoulder abduction    Shoulder adduction    Shoulder internal rotation 4   Shoulder external rotation 4   Middle trapezius    Lower trapezius    Elbow flexion    Elbow extension    Wrist flexion    Wrist extension    Wrist ulnar deviation    Wrist radial deviation    Wrist pronation    Wrist supination    Grip strength (lbs)    (Blank rows = not tested) not tested at this time secondary to recent surgery    PALPATION:  No unexpected tenderness to palpation    TODAY'S TREATMENT:  4/19 Pulley abudction and flexion 2x10 each PROM shoulder flexion  ABC x1 1lb, x1 2lb ER sidelying x10 1lb, 2x8 2lb Prone row x10 1lb, 2x8 2lbs Prone extension x10 1lb, 2x8 2lbs  Bicep curls 2lbs 3x8  Blue band rows 3x8 Blue band shoulder extension 3x10   4/16 PROM R shoulder all planes; grade I and II PA mobilization; trigger point release to pec; Bicep curls 2# 2x10  SL ER 2x10 1lb and 2lbs RPE of 5-6 with 2 and 3-4 at 1  ABC 1lb RPE 2 will advace next visit   Prone Row 2x15 2 lbs  Prone extension 2x15 1 lbs  and 2lbs  Prone W 2x15   Bicpes curl 2x15       4/9 Pulley flexion and abduction x2 mins PROM R shoulder  Bicep curls 2# 2x10 Standing  chicken wing (1.5# cuff weight around upper arm) 2x10 Bent over triceps extension 2# 2x10 Standing fwd reach-straight out 1# 2x10 Bicep curl to OH press- 1# 3x10 OH ball rolls 2.2# ball- 20ea 4 way  UBE: No resistance, fwd, backwards (fatigued after )     4/3 Pulley flexion and abduction x2 mins PROM R shoulder  PROM R shoulder all planes; grade I and II PA mobilization; trigger point release to pec;    Supine active flexion 1# 2x10 Supine straight arm ABC 2x 1lb   Side lying ER 2x10    Standing flexion  2x10  Standing abduction 2x10  Row red band 2x10   4/1 PROM R shoulder  Pulley flexion and abduction 2 mins each   Supine active flexion 1# 2x10 Supine straight arm ABC 2x 1lb   Side lying ER 2x10    Standing flexion  2x10  Standing abduction 2x10  3/26 Pulley flexion and abduction 2 mins each   Supine straight arm ABC 2x 1lb  Supine active flexion 2x10 1lb  Side lying ER 2x10   T band IR 2x10 yellow  Standing flexion 2x10 with thumb up able to go 130 2x10        DATE:  4/16 PROM R shoulder all planes; grade I and II PA mobilization; trigger point release to pec;   Supine straight arm ABC one-time 1lb  Wand flexion 2 x 10 2 pounds  3/22 PROM R shoulder all planes; grade I and II PA mobilization; trigger point release to pec;   Supine straight arm ABC one-time 1lb  Wand flexion 2 x 10 2 pounds  Unable to do extensions with yellow  Row 2x10 red   T band IR 2x10 yellow Supine active flexion 1# 2x10 Supine straight arm ABC 2x 1lb   Side lying ER 2x10    Standing flexion  2x10  Standing abduction 2x10  PATIENT EDUCATION: Education details: HEP, symptom management, progression of activity.  Importance of sling use. Person educated: Patient Education method: Explanation, Demonstration, Tactile cues, Verbal cues, and Handouts Education comprehension: verbalized understanding, returned demonstration, verbal cues required,  tactile cues required, and needs further education  HOME EXERCISE PROGRAM: Access Code: [email or web address] URL: [email or web address] Date: 03/08/2022 Prepared by: Lorayne Bender  Exercises - Reviewed pendulums - PROM into ER and flexion   ASSESSMENT:  CLINICAL IMPRESSION: Patient shoulder ROM is progressing really well. Patient tolerated treatment well. Patient was able to progress to blue band for shoulder rows and extension. We continue to keep her exercise consistent but were able to increase weight with more reps. We continue to keep her exercise below 90 degrees. Patient  is progressing well overall and will continue to challenge her as tolerated via protocol.  OBJECTIVE IMPAIRMENTS: decreased activity tolerance, decreased mobility, decreased ROM, decreased strength, impaired UE functional use, and pain.   ACTIVITY LIMITATIONS: carrying, lifting, sleeping, bathing, dressing, self feeding, reach over head, and hygiene/grooming  PARTICIPATION LIMITATIONS: meal prep, cleaning, laundry, community activity, occupation, and yard work  PERSONAL FACTORS:none    REHAB POTENTIAL: Excellent  CLINICAL DECISION MAKING: Stable/uncomplicated  EVALUATION COMPLEXITY: Low   GOALS: Goals reviewed with patient? Yes  SHORT TERM GOALS: Target date: 04/06/2022    Patient will increase passive right shoulder flexion to 110 degrees Baseline: Goal status: full passive achieved   2.  Patient will increase passive right shoulder external rotation to 70 degrees Baseline:  Goal status: 60 initial goal met 4/3 goal revised   3.  Patient will be independent with basic HEP following protocol suggestions Baseline:  Goal status: MET 3/12   LONG TERM GOALS: Target date: 05/03/2022 4/3    Patient will reach overhead to cabinet with 2 pound weight without pain by the end of rehab Baseline:  Goal status: can reach without a weight. Progressing   2.  Patient will reach behind her head to do  her hair without pain Baseline:  Goal status: mild pain   3.  Patient will reach behind her back without pain Baseline:  Goal status: to L3  PLAN:  PT FREQUENCY: 2x/week  PT DURATION: 6 weeks   PLANNED INTERVENTIONS: Therapeutic exercises, Therapeutic activity, Neuromuscular re-education, Patient/Family education, Self Care, Joint mobilization, Aquatic Therapy, Dry Needling, Cryotherapy, Moist heat, Ultrasound, Ionotophoresis /ml Dexamethasone, and Manual therapy  PLAN FOR NEXT SESSION: Increase weight for prone rows and extension. Will revisit over the head exercises such as diagonal flexion with resistance band.   Lorayne Bender PT DPT   06/24/2022, 3:29 PM   Cristal Ford SPT   I have reviewed and concur with this student's documentation.  During this treatment session, the therapist was present, participating in and directing the treatment.

## 2022-06-28 ENCOUNTER — Ambulatory Visit (HOSPITAL_BASED_OUTPATIENT_CLINIC_OR_DEPARTMENT_OTHER): Payer: BC Managed Care – PPO | Admitting: Physical Therapy

## 2022-06-28 ENCOUNTER — Encounter (HOSPITAL_BASED_OUTPATIENT_CLINIC_OR_DEPARTMENT_OTHER): Payer: Self-pay | Admitting: Physical Therapy

## 2022-06-28 DIAGNOSIS — M6281 Muscle weakness (generalized): Secondary | ICD-10-CM

## 2022-06-28 DIAGNOSIS — M25611 Stiffness of right shoulder, not elsewhere classified: Secondary | ICD-10-CM

## 2022-06-28 NOTE — Therapy (Signed)
OUTPATIENT PHYSICAL THERAPY SHOULDER EVALUATION   Patient Name: Destiny Gardner MRN: [id number] DOB:11/06/1965, 57 y.o., female Today's Date: 06/28/2022  END OF SESSION:  PT End of Session - 06/28/22 1306     Visit Number 31    Number of Visits 38    Date for PT Re-Evaluation 07/20/22    PT Start Time 1303    PT Stop Time 1344    PT Time Calculation (min) 41 min    Activity Tolerance Patient tolerated treatment well    Behavior During Therapy 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,now with constipation 2010   PONV (postoperative nausea and vomiting)    Past Surgical History:  Procedure Laterality Date   COLONOSCOPY  05/05/2009   TCS/EGD :noraml stomach,colon,and duodenum   COLONOSCOPY  2020   ESOPHAGOGASTRODUODENOSCOPY  05/05/2009   with TCS   HERNIA REPAIR  2015   Umbilical Hernia   LAPAROTOMY  03/07/1997   endometriosis   MYRINGOTOMY     Tubes,Tympanoplasty   RHINOPLASTY  1989   SHOULDER ARTHROSCOPY WITH ROTATOR CUFF REPAIR AND OPEN BICEPS TENODESIS Right 03/03/2022   Procedure: RIGHT SHOULDER ARTHROSCOPY WITH ROTATOR CUFF REPAIR AND BICEPS TENODESIS;  Surgeon: Huel Cote, MD;  Location: [facility] OR;  Service: Orthopedics;  Laterality: Right;   Tonillectomy  1990   WISDOM TOOTH EXTRACTION     Patient Active Problem List   Diagnosis Date Noted   Tendinitis of right rotator cuff 03/03/2022   Biceps tendinitis of right shoulder 03/03/2022   Cough 05/18/2015   ABDOMINAL PAIN 05/26/2009   CHANGE IN BOWELS 01/27/2009   CONSTIPATION, CHRONIC 12/11/2008   ENDOMETRIOSIS 12/11/2008   IRRITABLE BOWEL SYNDROME, HX OF 12/11/2008    PCP: Onnie Graham FNP   REFERRING PROVIDER: Dr Huel Cote    REFERRING DIAG:  Diagnosis  M75.81 (ICD-10-CM) - Tendinitis of right rotator cuff  RTC repair   THERAPY DIAG:  Muscle weakness (generalized)  Stiffness of right shoulder, not elsewhere classified  Rationale for Evaluation and Treatment: Rehabilitation  ONSET DATE: 03/03/2022  Days since surgery: 117   SUBJECTIVE:  SUBJECTIVE STATEMENT: Patient reports with no pain at entry of therapy. Patient still reports some tightness at anterior shoulder at end range of shoulder flexion. Patient has not resumed over the head exercises.    PERTINENT HISTORY: Ocular Migranes  PAIN:  Are you having pain? Yes: NPRS scale: 2-3/10  Pain location: right anterior shoulder  Pain description: Cramping Aggravating factors: use of the shoulder/ sleeping  Relieving factors: rest, tylenol   PRECAUTIONS: Shoulder follow protocol   WEIGHT BEARING RESTRICTIONS: Yes NWB   FALLS:  Has patient fallen in last 6 months? No  LIVING ENVIRONMENT:  OCCUPATION: Stay at home mom.   Hobbies: work around [employer] doing house rehab, cooking.  PLOF: Independent  PATIENT GOALS:  Functional use of her arm   NEXT MD VISIT:   OBJECTIVE:   DIAGNOSTIC FINDINGS:  Nothing post op PATIENT SURVEYS:  FOTO    COGNITION: Overall cognitive status: Within functional limits for tasks assessed     SENSATION: A little numbness in the hand but it has improved   POSTURE: Good.   UPPER EXTREMITY ROM:   Passive ROM Right 2/20 Left eval  Shoulder flexion 125 No limit  Shoulder extension    Shoulder abduction    Shoulder adduction    Shoulder internal rotation 60 No limit      Shoulder external rotation 25 No limit   Elbow flexion    Elbow extension    Wrist flexion    Wrist extension    Wrist ulnar deviation    Wrist  radial deviation    Wrist pronation    Wrist supination    (Blank rows = not tested)  UPPER EXTREMITY ROM:  Active ROM Right eval Left eval  Shoulder flexion 128 Full   Shoulder extension    Shoulder abduction    Shoulder adduction    Shoulder extension    Shoulder internal rotation L3 with pulling in the front  To scapular border   Shoulder external rotation Can reach behind her head with mild pain    Elbow flexion    Elbow extension    Wrist flexion    Wrist extension    Wrist ulnar deviation    Wrist radial deviation    Wrist pronation    Wrist supination     (Blank rows = not tested)   UPPER EXTREMITY MMT:  MMT Right eval Left eval  Shoulder flexion 4   Shoulder extension    Shoulder abduction    Shoulder adduction    Shoulder internal rotation 4   Shoulder external rotation 4   Middle trapezius    Lower trapezius    Elbow flexion    Elbow extension    Wrist flexion    Wrist extension    Wrist ulnar deviation    Wrist radial deviation    Wrist pronation    Wrist supination    Grip strength (lbs)    (Blank rows = not tested) not tested at this time secondary to recent surgery    PALPATION:  No unexpected tenderness to palpation    TODAY'S TREATMENT:  4/23 Pulley abduction and flexion x22mins each PROM shoulder flexion  STM to right trap Chest press x1lb 2x10 Shoulder wand 2x10 ABC x2 2lb S/L ER 3x8 2lbs  Prone rows Prone extension   4/19 Pulley abudction and flexion 2x10 each PROM shoulder flexion  ABC x1 1lb, x1 2lb ER sidelying x10 1lb, 2x8 2lb Prone row x10 1lb, 2x8 2lbs Prone extension x10 1lb, 2x8 2lbs  Bicep curls 2lbs 3x8  Blue band rows 3x8 Blue band shoulder extension 3x10   4/16 PROM R shoulder all planes; grade I and II PA mobilization; trigger point release to pec; Bicep curls 2#  2x10  SL ER 2x10 1lb and 2lbs RPE of 5-6 with 2 and 3-4 at 1  ABC 1lb RPE 2 will advace next visit   Prone Row 2x15 2 lbs  Prone extension 2x15 1 lbs  and 2lbs  Prone W 2x15   Bicpes curl 2x15       4/9 Pulley flexion and abduction x2 mins PROM R shoulder  Bicep curls 2# 2x10 Standing chicken wing (1.5# cuff weight around upper arm) 2x10 Bent over triceps extension 2# 2x10 Standing fwd reach-straight out 1# 2x10 Bicep curl to OH press- 1# 3x10 OH ball rolls 2.2# ball- 20ea 4 way  UBE: No resistance, fwd, backwards (fatigued after )     4/3 Pulley flexion and abduction x2 mins PROM R shoulder  PROM R shoulder all planes; grade I and II PA mobilization; trigger point release to pec;    Supine active flexion 1# 2x10 Supine straight arm ABC 2x 1lb   Side lying ER 2x10    Standing flexion  2x10  Standing abduction 2x10  Row red band 2x10   4/1 PROM R shoulder  Pulley flexion and abduction 2 mins each   Supine active flexion 1# 2x10 Supine straight arm ABC 2x 1lb   Side lying ER 2x10    Standing flexion  2x10  Standing abduction 2x10  3/26 Pulley flexion and abduction 2 mins each   Supine straight arm ABC 2x 1lb  Supine active flexion 2x10 1lb  Side lying ER 2x10   T band IR 2x10 yellow  Standing flexion 2x10 with thumb up able to go 130 2x10        DATE:  4/16 PROM R shoulder all planes; grade I and II PA mobilization; trigger point release to pec;   Supine straight arm ABC one-time 1lb  Wand flexion 2 x 10 2 pounds  3/22 PROM R shoulder all planes; grade I and II PA mobilization; trigger point release to pec;   Supine straight arm ABC one-time 1lb  Wand flexion 2 x 10 2 pounds  Unable to do extensions with yellow  Row 2x10 red   T band IR 2x10 yellow Supine active flexion 1# 2x10 Supine straight arm ABC 2x 1lb   Side lying ER 2x10    Standing flexion  2x10  Standing abduction 2x10  PATIENT  EDUCATION: Education details: HEP, symptom management, progression of activity.  Importance of sling use. Person educated: Patient Education method: Explanation, Demonstration, Tactile cues, Verbal cues, and Handouts Education comprehension: verbalized understanding, returned demonstration, verbal cues required, tactile cues required, and needs further education  HOME EXERCISE PROGRAM: Access Code: [email or web address] URL: [email or web address] Date: 03/08/2022 Prepared by: Lorayne Bender  Exercises - Reviewed pendulums - PROM into ER and flexion   ASSESSMENT:  CLINICAL IMPRESSION: The patient was somewhat limited today 2nd to soreness due to hanging a shelf.  We worked on supine overhead motion but continue to reduce standing overhead motion. She now has no pain just tightness reaching overhead, although she has been avoiding the movement. We will continue to progress as tolerated.  OBJECTIVE IMPAIRMENTS: decreased activity tolerance, decreased mobility, decreased ROM, decreased strength, impaired UE functional use, and pain.   ACTIVITY LIMITATIONS: carrying, lifting, sleeping, bathing, dressing, self feeding, reach over head, and hygiene/grooming  PARTICIPATION LIMITATIONS: meal prep, cleaning, laundry, community activity, occupation, and yard work  PERSONAL FACTORS:none    REHAB POTENTIAL: Excellent  CLINICAL DECISION MAKING: Stable/uncomplicated  EVALUATION COMPLEXITY: Low   GOALS: Goals reviewed with patient? Yes  SHORT TERM GOALS: Target date: 04/06/2022    Patient will increase passive right shoulder flexion to 110 degrees Baseline: Goal status: full passive achieved   2.  Patient will increase passive right shoulder external rotation to 70 degrees Baseline:  Goal status: 60 initial goal met 4/3 goal revised   3.  Patient will be independent with basic HEP following protocol suggestions Baseline:  Goal status: MET 3/12   LONG TERM GOALS: Target date:  05/03/2022 4/3    Patient will reach overhead to cabinet with 2 pound weight without pain by the end of rehab Baseline:  Goal status: can reach without a weight. Progressing   2.  Patient will reach behind her head to do her hair without pain Baseline:  Goal status: mild pain   3.  Patient will reach behind her back without pain Baseline:  Goal status: to L3  PLAN:  PT FREQUENCY: 2x/week  PT DURATION: 6 weeks   PLANNED INTERVENTIONS: Therapeutic exercises, Therapeutic activity, Neuromuscular re-education, Patient/Family education, Self Care, Joint mobilization, Aquatic Therapy, Dry Needling, Cryotherapy, Moist heat, Ultrasound, Ionotophoresis /ml Dexamethasone, and Manual therapy  PLAN FOR NEXT SESSION: Increase weight for prone rows and extension. Will revisit over the head exercises such as diagonal flexion with resistance band.   Lorayne Bender PT DPT   06/28/2022, 4:41 PM   Cristal Ford SPT   I have reviewed and concur with this student's documentation.  During this treatment session, the therapist was present, participating in and directing the treatment.

## 2022-07-01 ENCOUNTER — Ambulatory Visit (HOSPITAL_BASED_OUTPATIENT_CLINIC_OR_DEPARTMENT_OTHER): Payer: BC Managed Care – PPO | Admitting: Physical Therapy

## 2022-07-01 DIAGNOSIS — M6281 Muscle weakness (generalized): Secondary | ICD-10-CM

## 2022-07-01 DIAGNOSIS — M25521 Pain in right elbow: Secondary | ICD-10-CM

## 2022-07-01 DIAGNOSIS — G8929 Other chronic pain: Secondary | ICD-10-CM

## 2022-07-01 DIAGNOSIS — M25611 Stiffness of right shoulder, not elsewhere classified: Secondary | ICD-10-CM

## 2022-07-01 NOTE — Therapy (Signed)
OUTPATIENT PHYSICAL THERAPY SHOULDER EVALUATION   Patient Name: Destiny Gardner MRN: [id number] DOB:[birth year], 57 y.o., female Today's Date: 07/01/2022  END OF SESSION:  PT End of Session - 07/01/22 1310     Visit Number 32    Number of Visits 38    Date for PT Re-Evaluation 07/20/22    PT Start Time 1301    PT Stop Time 1344    PT Time Calculation (min) 43 min    Activity Tolerance Patient tolerated treatment well    Behavior During Therapy WFL for tasks assessed/performed                      Past Medical History:  Diagnosis Date   Complication of anesthesia    post-op N/V, waking up during rhinoplasty and colonoscopy   Endometriosis    Headache    Hx Occular Migraine   Inflammatory bowel disease    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,now with constipation 2010   PONV (postoperative nausea and vomiting)    Past Surgical History:  Procedure Laterality Date   COLONOSCOPY  05/05/2009   TCS/EGD :noraml stomach,colon,and duodenum   COLONOSCOPY  2020   ESOPHAGOGASTRODUODENOSCOPY  05/05/2009   with TCS   HERNIA REPAIR  2015   Umbilical Hernia   LAPAROTOMY  03/07/1997   endometriosis   MYRINGOTOMY     Tubes,Tympanoplasty   RHINOPLASTY  1989   SHOULDER ARTHROSCOPY WITH ROTATOR CUFF REPAIR AND OPEN BICEPS TENODESIS Right 03/03/2022   Procedure: RIGHT SHOULDER ARTHROSCOPY WITH ROTATOR CUFF REPAIR AND BICEPS TENODESIS;  Surgeon: Huel Cote, MD;  Location: [facility] OR;  Service: Orthopedics;  Laterality: Right;   Tonillectomy  1990   WISDOM TOOTH EXTRACTION     Patient Active Problem List   Diagnosis Date Noted   Tendinitis of right rotator cuff 03/03/2022   Biceps tendinitis of right shoulder 03/03/2022   Cough 05/18/2015   ABDOMINAL PAIN 05/26/2009   CHANGE IN BOWELS 01/27/2009   CONSTIPATION, CHRONIC 12/11/2008   ENDOMETRIOSIS 12/11/2008   IRRITABLE BOWEL SYNDROME, HX OF 12/11/2008    PCP: Onnie Graham FNP   REFERRING PROVIDER: Dr Huel Cote    REFERRING DIAG:  Diagnosis  M75.81 (ICD-10-CM) - Tendinitis of right rotator cuff  RTC repair   THERAPY DIAG:  Muscle weakness (generalized)  Stiffness of right shoulder, not elsewhere classified  Chronic right shoulder pain  Pain in right elbow  Rationale for Evaluation and Treatment: Rehabilitation  ONSET DATE: 03/03/2022  Days since surgery: 120   SUBJECTIVE:  SUBJECTIVE STATEMENT: Patient reports with pain at entry of therapy. Patient reports that it was painful first thing in the morning. Patient reports that she took her cat to the vet and tried to carry her mainly with her left hand but it was still irritating the right shoulder.   PERTINENT HISTORY: Ocular Migranes  PAIN:  Are you having pain? Yes: NPRS scale: 2-3/10  Pain location: right anterior shoulder  Pain description: Cramping Aggravating factors: use of the shoulder/ sleeping  Relieving factors: rest, tylenol   PRECAUTIONS: Shoulder follow protocol   WEIGHT BEARING RESTRICTIONS: Yes NWB   FALLS:  Has patient fallen in last 6 months? No  LIVING ENVIRONMENT:  OCCUPATION: Stay at home mom.   Hobbies: work around [employer] doing house rehab, cooking.  PLOF: Independent  PATIENT GOALS:  Functional use of her arm   NEXT MD VISIT:   OBJECTIVE:   DIAGNOSTIC FINDINGS:  Nothing post op PATIENT SURVEYS:  FOTO    COGNITION: Overall cognitive status: Within functional limits for tasks assessed     SENSATION: A little numbness in the hand but it has improved   POSTURE: Good.   UPPER EXTREMITY ROM:   Passive ROM Right 2/20 Left eval  Shoulder flexion 125 No limit  Shoulder extension    Shoulder abduction    Shoulder adduction    Shoulder internal rotation 60 No limit      Shoulder external rotation 25  No limit   Elbow flexion    Elbow extension    Wrist flexion    Wrist extension    Wrist ulnar deviation    Wrist radial deviation    Wrist pronation    Wrist supination    (Blank rows = not tested)  UPPER EXTREMITY ROM:  Active ROM Right eval Left eval  Shoulder flexion 128 Full   Shoulder extension    Shoulder abduction    Shoulder adduction    Shoulder extension    Shoulder internal rotation L3 with pulling in the front  To scapular border   Shoulder external rotation Can reach behind her head with mild pain    Elbow flexion    Elbow extension    Wrist flexion    Wrist extension    Wrist ulnar deviation    Wrist radial deviation    Wrist pronation    Wrist supination     (Blank rows = not tested)   UPPER EXTREMITY MMT:  MMT Right eval Left eval  Shoulder flexion 4   Shoulder extension    Shoulder abduction    Shoulder adduction    Shoulder internal rotation 4   Shoulder external rotation 4   Middle trapezius    Lower trapezius    Elbow flexion    Elbow extension    Wrist flexion    Wrist extension    Wrist ulnar deviation    Wrist radial deviation    Wrist pronation    Wrist supination    Grip strength (lbs)    (Blank rows = not tested) not tested at this time secondary to recent surgery    PALPATION:  No unexpected tenderness to palpation    TODAY'S TREATMENT:  4/26 Pulley abduction and flexion x1 mins each PROM shoulder flexion, ER STM anterior deltoids and biceps  Chest press x1lb 2x10 Shoulder wand 2x10 x1 ABC x2 (No weight) S/L ER 3x8 2lbs  Prone rows x10 no weight, 2x8 1lb Prone extension x10 no weight, 2x8 1lb   4/23 Pulley abduction and flexion x3mins each PROM shoulder flexion  STM to right trap Chest press x1lb 2x10 Shoulder wand 2x10 ABC x2 2lb S/L ER 3x8 2lbs  Prone rows Prone  extension   4/19 Pulley abudction and flexion 2x10 each PROM shoulder flexion  ABC x1 1lb, x1 2lb ER sidelying x10 1lb, 2x8 2lb Prone row x10 1lb, 2x8 2lbs Prone extension x10 1lb, 2x8 2lbs  Bicep curls 2lbs 3x8  Blue band rows 3x8 Blue band shoulder extension 3x10   4/16 PROM R shoulder all planes; grade I and II PA mobilization; trigger point release to pec; Bicep curls 2# 2x10  SL ER 2x10 1lb and 2lbs RPE of 5-6 with 2 and 3-4 at 1  ABC 1lb RPE 2 will advace next visit   Prone Row 2x15 2 lbs  Prone extension 2x15 1 lbs  and 2lbs  Prone W 2x15   Bicpes curl 2x15       4/9 Pulley flexion and abduction x2 mins PROM R shoulder  Bicep curls 2# 2x10 Standing chicken wing (1.5# cuff weight around upper arm) 2x10 Bent over triceps extension 2# 2x10 Standing fwd reach-straight out 1# 2x10 Bicep curl to OH press- 1# 3x10 OH ball rolls 2.2# ball- 20ea 4 way  UBE: No resistance, fwd, backwards (fatigued after )     4/3 Pulley flexion and abduction x2 mins PROM R shoulder  PROM R shoulder all planes; grade I and II PA mobilization; trigger point release to pec;    Supine active flexion 1# 2x10 Supine straight arm ABC 2x 1lb   Side lying ER 2x10    Standing flexion  2x10  Standing abduction 2x10  Row red band 2x10   4/1 PROM R shoulder  Pulley flexion and abduction 2 mins each   Supine active flexion 1# 2x10 Supine straight arm ABC 2x 1lb   Side lying ER 2x10    Standing flexion  2x10  Standing abduction 2x10  3/26 Pulley flexion and abduction 2 mins each   Supine straight arm ABC 2x 1lb  Supine active flexion 2x10 1lb  Side lying ER 2x10   T band IR 2x10 yellow  Standing flexion 2x10 with thumb up able to go 130 2x10        DATE:  4/16 PROM R shoulder all planes; grade I and II PA mobilization; trigger point release to pec;   Supine straight arm ABC one-time 1lb  Wand flexion 2 x 10 2 pounds  3/22 PROM R  shoulder all planes; grade I and II PA mobilization; trigger point release to pec;   Supine straight arm ABC one-time 1lb  Wand flexion 2 x 10 2 pounds  Unable to do extensions with yellow  Row 2x10 red   T band IR 2x10 yellow Supine active flexion 1# 2x10 Supine straight arm ABC 2x 1lb   Side lying ER 2x10    Standing flexion  2x10  Standing abduction 2x10  PATIENT EDUCATION: Education details: HEP, symptom management, progression of activity.  Importance of sling use. Person educated: Patient Education method: Explanation, Demonstration, Tactile cues, Verbal cues, and Handouts Education comprehension: verbalized understanding, returned demonstration, verbal cues required, tactile cues  required, and needs further education  HOME EXERCISE PROGRAM: Access Code: [email or web address] URL: [email or web address] Date: 03/08/2022 Prepared by: Lorayne Bender  Exercises - Reviewed pendulums - PROM into ER and flexion   ASSESSMENT:  CLINICAL IMPRESSION: Patient is still limited in overhead reaching. Patient reports with pain at entry of therapy. Patient session was kept consistent. We continued to work on supine overhead motion but backed off on standing overhead motion. We will continue to progress as tolerated. She was tender to palpation in the anterior shoulder. We focused on manual therapy.   OBJECTIVE IMPAIRMENTS: decreased activity tolerance, decreased mobility, decreased ROM, decreased strength, impaired UE functional use, and pain.   ACTIVITY LIMITATIONS: carrying, lifting, sleeping, bathing, dressing, self feeding, reach over head, and hygiene/grooming  PARTICIPATION LIMITATIONS: meal prep, cleaning, laundry, community activity, occupation, and yard work  PERSONAL FACTORS:none    REHAB POTENTIAL: Excellent  CLINICAL DECISION MAKING: Stable/uncomplicated  EVALUATION COMPLEXITY: Low   GOALS: Goals reviewed with patient? Yes  SHORT TERM GOALS: Target date:  04/06/2022    Patient will increase passive right shoulder flexion to 110 degrees Baseline: Goal status: full passive achieved   2.  Patient will increase passive right shoulder external rotation to 70 degrees Baseline:  Goal status: 60 initial goal met 4/3 goal revised   3.  Patient will be independent with basic HEP following protocol suggestions Baseline:  Goal status: MET 3/12   LONG TERM GOALS: Target date: 05/03/2022 4/3    Patient will reach overhead to cabinet with 2 pound weight without pain by the end of rehab Baseline:  Goal status: can reach without a weight. Progressing   2.  Patient will reach behind her head to do her hair without pain Baseline:  Goal status: mild pain   3.  Patient will reach behind her back without pain Baseline:  Goal status: to L3  PLAN:  PT FREQUENCY: 2x/week  PT DURATION: 6 weeks   PLANNED INTERVENTIONS: Therapeutic exercises, Therapeutic activity, Neuromuscular re-education, Patient/Family education, Self Care, Joint mobilization, Aquatic Therapy, Dry Needling, Cryotherapy, Moist heat, Ultrasound, Ionotophoresis 4mg /ml Dexamethasone, and Manual therapy  PLAN FOR NEXT SESSION: Increase weight for prone rows and extension.    Lorayne Bender PT DPT   07/01/2022, 2:46 PM   Cristal Ford SPT   I have reviewed and concur with this student's documentation.  During this treatment session, the therapist was present, participating in and directing the treatment.

## 2022-07-06 ENCOUNTER — Encounter (HOSPITAL_BASED_OUTPATIENT_CLINIC_OR_DEPARTMENT_OTHER): Payer: Self-pay | Admitting: Physical Therapy

## 2022-07-06 ENCOUNTER — Ambulatory Visit (HOSPITAL_BASED_OUTPATIENT_CLINIC_OR_DEPARTMENT_OTHER): Payer: BC Managed Care – PPO | Attending: Family Medicine | Admitting: Physical Therapy

## 2022-07-06 DIAGNOSIS — M25511 Pain in right shoulder: Secondary | ICD-10-CM | POA: Insufficient documentation

## 2022-07-06 DIAGNOSIS — M6281 Muscle weakness (generalized): Secondary | ICD-10-CM | POA: Diagnosis present

## 2022-07-06 DIAGNOSIS — M25611 Stiffness of right shoulder, not elsewhere classified: Secondary | ICD-10-CM | POA: Diagnosis present

## 2022-07-06 DIAGNOSIS — M25521 Pain in right elbow: Secondary | ICD-10-CM | POA: Insufficient documentation

## 2022-07-06 DIAGNOSIS — G8929 Other chronic pain: Secondary | ICD-10-CM | POA: Diagnosis present

## 2022-07-06 NOTE — Therapy (Signed)
OUTPATIENT PHYSICAL THERAPY SHOULDER EVALUATION   Patient Name: Destiny Gardner MRN: [id number] DOB:05/17/1965, 57 y.o., female Today's Date: 07/01/2022  END OF SESSION:  PT End of Session - 07/01/22 1310     Visit Number 32    Number of Visits 38    Date for PT Re-Evaluation 07/20/22    PT Start Time 1301    PT Stop Time 1344    PT Time Calculation (min) 43 min    Activity Tolerance Patient tolerated treatment well    Behavior During Therapy WFL for tasks assessed/performed                      Past Medical History:  Diagnosis Date   Complication of anesthesia    post-op N/V, waking up during rhinoplasty and colonoscopy   Endometriosis    Headache    Hx Occular Migraine   Inflammatory bowel disease    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,now with constipation 2010   PONV (postoperative nausea and vomiting)    Past Surgical History:  Procedure Laterality Date   COLONOSCOPY  05/05/2009   TCS/EGD :noraml stomach,colon,and duodenum   COLONOSCOPY  2020   ESOPHAGOGASTRODUODENOSCOPY  05/05/2009   with TCS   HERNIA REPAIR  2015   Umbilical Hernia   LAPAROTOMY  03/07/1997   endometriosis   MYRINGOTOMY     Tubes,Tympanoplasty   RHINOPLASTY  1989   SHOULDER ARTHROSCOPY WITH ROTATOR CUFF REPAIR AND OPEN BICEPS TENODESIS Right 03/03/2022   Procedure: RIGHT SHOULDER ARTHROSCOPY WITH ROTATOR CUFF REPAIR AND BICEPS TENODESIS;  Surgeon: Huel Cote, MD;  Location: [facility] OR;  Service: Orthopedics;  Laterality: Right;   Tonillectomy  1990   WISDOM TOOTH EXTRACTION     Patient Active Problem List   Diagnosis Date Noted   Tendinitis of right rotator cuff 03/03/2022   Biceps tendinitis of right shoulder 03/03/2022   Cough 05/18/2015   ABDOMINAL PAIN 05/26/2009   CHANGE IN BOWELS 01/27/2009   CONSTIPATION, CHRONIC 12/11/2008   ENDOMETRIOSIS 12/11/2008   IRRITABLE BOWEL SYNDROME, HX OF 12/11/2008    PCP: Onnie Graham FNP   REFERRING PROVIDER: Dr Huel Cote    REFERRING DIAG:  Diagnosis  M75.81 (ICD-10-CM) - Tendinitis of right rotator cuff  RTC repair   THERAPY DIAG:  Muscle weakness (generalized)  Stiffness of right shoulder, not elsewhere classified  Chronic right shoulder pain  Pain in right elbow  Rationale for Evaluation and Treatment: Rehabilitation  ONSET DATE: 03/03/2022  Days since surgery: 120   SUBJECTIVE:  SUBJECTIVE STATEMENT: The patient reports her shoulder has been doing well the past few days. She continues to feel it going overhead but it is doing well.  PERTINENT HISTORY: Ocular Migranes  PAIN:  Are you having pain? Yes: NPRS scale: 2-3/10  Pain location: right anterior shoulder  Pain description: Cramping Aggravating factors: use of the shoulder/ sleeping  Relieving factors: rest, tylenol   PRECAUTIONS: Shoulder follow protocol   WEIGHT BEARING RESTRICTIONS: Yes NWB   FALLS:  Has patient fallen in last 6 months? No  LIVING ENVIRONMENT:  OCCUPATION: Stay at home mom.   Hobbies: work around [employer] doing house rehab, cooking.  PLOF: Independent  PATIENT GOALS:  Functional use of her arm   NEXT MD VISIT:   OBJECTIVE:   DIAGNOSTIC FINDINGS:  Nothing post op PATIENT SURVEYS:  FOTO    COGNITION: Overall cognitive status: Within functional limits for tasks assessed     SENSATION: A little numbness in the hand but it has improved   POSTURE: Good.   UPPER EXTREMITY ROM:   Passive ROM Right 2/20 Left eval  Shoulder flexion 125 No limit  Shoulder extension    Shoulder abduction    Shoulder adduction    Shoulder internal rotation 60 No limit      Shoulder external rotation 25 No limit   Elbow flexion    Elbow extension    Wrist flexion    Wrist extension    Wrist ulnar deviation    Wrist  radial deviation    Wrist pronation    Wrist supination    (Blank rows = not tested)  UPPER EXTREMITY ROM:  Active ROM Right eval Left eval  Shoulder flexion 128 Full   Shoulder extension    Shoulder abduction    Shoulder adduction    Shoulder extension    Shoulder internal rotation L3 with pulling in the front  To scapular border   Shoulder external rotation Can reach behind her head with mild pain    Elbow flexion    Elbow extension    Wrist flexion    Wrist extension    Wrist ulnar deviation    Wrist radial deviation    Wrist pronation    Wrist supination     (Blank rows = not tested)   UPPER EXTREMITY MMT:  MMT Right eval Left eval  Shoulder flexion 4   Shoulder extension    Shoulder abduction    Shoulder adduction    Shoulder internal rotation 4   Shoulder external rotation 4   Middle trapezius    Lower trapezius    Elbow flexion    Elbow extension    Wrist flexion    Wrist extension    Wrist ulnar deviation    Wrist radial deviation    Wrist pronation    Wrist supination    Grip strength (lbs)    (Blank rows = not tested) not tested at this time secondary to recent surgery    PALPATION:  No unexpected tenderness to palpation    TODAY'S TREATMENT:  4/30 Pulley abduction and flexion x1 mins each PROM shoulder flexion, ER STM anterior deltoids and biceps  Chest press x3 lb 2x10  ABC x2 2lbs  S/L ER 3x8 2lbs  Prone rows 3x10 2lbs  Prone extension 3x10 3 lns  IR Red band 3x10  4/26 Pulley abduction and flexion x1 mins each PROM shoulder flexion, ER STM anterior deltoids and biceps  Chest press x1lb 2x10 Shoulder wand 2x10 x1 ABC x2 (No weight) S/L ER 3x8 2lbs  Prone rows x10 no weight, 2x8 1lb Prone extension x10 no weight, 2x8 1lb   4/23 Pulley abduction and flexion x61mins each PROM shoulder flexion   STM to right trap Chest press x1lb 2x10 Shoulder wand 2x10 ABC x2 2lb S/L ER 3x8 2lbs  Prone rows Prone extension   4/19 Pulley abudction and flexion 2x10 each PROM shoulder flexion  ABC x1 1lb, x1 2lb ER sidelying x10 1lb, 2x8 2lb Prone row x10 1lb, 2x8 2lbs Prone extension x10 1lb, 2x8 2lbs  Bicep curls 2lbs 3x8  Blue band rows 3x8 Blue band shoulder extension 3x10   4/16 PROM R shoulder all planes; grade I and II PA mobilization; trigger point release to pec; Bicep curls 2# 2x10  SL ER 2x10 1lb and 2lbs RPE of 5-6 with 2 and 3-4 at 1  ABC 1lb RPE 2 will advace next visit   Prone Row 2x15 2 lbs  Prone extension 2x15 1 lbs  and 2lbs  Prone W 2x15   Bicpes curl 2x15       4/9 Pulley flexion and abduction x2 mins PROM R shoulder  Bicep curls 2# 2x10 Standing chicken wing (1.5# cuff weight around upper arm) 2x10 Bent over triceps extension 2# 2x10 Standing fwd reach-straight out 1# 2x10 Bicep curl to OH press- 1# 3x10 OH ball rolls 2.2# ball- 20ea 4 way  UBE: No resistance, fwd, backwards (fatigued after )      PATIENT EDUCATION: Education details: HEP, symptom management, progression of activity.  Importance of sling use. Person educated: Patient Education method: Explanation, Demonstration, Tactile cues, Verbal cues, and Handouts Education comprehension: verbalized understanding, returned demonstration, verbal cues required, tactile cues required, and needs further education  HOME EXERCISE PROGRAM: Access Code: [email or web address] URL: [email or web address] Date: 03/08/2022 Prepared by: Lorayne Bender  Exercises - Reviewed pendulums - PROM into ER and flexion   ASSESSMENT:  CLINICAL IMPRESSION: The patient tolerated treatment well. We continue to keep her activity below 90 degrees. She can reach above 90 degrees without much pain. We continue to progress her load with scapular exercises and her RTC strengthening exercises.  She was advised to continue with her current pan at home. She is progressing towards D/C at this time. She had mild tenderness to palpation in the bicpes today compared to last visit.     OBJECTIVE IMPAIRMENTS: decreased activity tolerance, decreased mobility, decreased ROM, decreased strength, impaired UE functional use, and pain.   ACTIVITY LIMITATIONS: carrying, lifting, sleeping, bathing, dressing, self feeding, reach over head, and hygiene/grooming  PARTICIPATION LIMITATIONS: meal prep, cleaning, laundry, community activity, occupation, and yard work  PERSONAL FACTORS:none    REHAB POTENTIAL: Excellent  CLINICAL DECISION MAKING: Stable/uncomplicated  EVALUATION COMPLEXITY: Low   GOALS: Goals reviewed with patient? Yes  SHORT TERM GOALS: Target date: 04/06/2022    Patient will increase passive right shoulder flexion to 110 degrees Baseline: Goal status: full passive achieved   2.  Patient will increase passive right shoulder external rotation to 70 degrees Baseline:  Goal  status: 60 initial goal met 4/3 goal revised   3.  Patient will be independent with basic HEP following protocol suggestions Baseline:  Goal status: MET 3/12   LONG TERM GOALS: Target date: 05/03/2022 4/3    Patient will reach overhead to cabinet with 2 pound weight without pain by the end of rehab Baseline:  Goal status: can reach without a weight. Progressing   2.  Patient will reach behind her head to do her hair without pain Baseline:  Goal status: mild pain   3.  Patient will reach behind her back without pain Baseline:  Goal status: to L3  PLAN:  PT FREQUENCY: 2x/week  PT DURATION: 6 weeks   PLANNED INTERVENTIONS: Therapeutic exercises, Therapeutic activity, Neuromuscular re-education, Patient/Family education, Self Care, Joint mobilization, Aquatic Therapy, Dry Needling, Cryotherapy, Moist heat, Ultrasound, Ionotophoresis 4mg /ml Dexamethasone, and Manual therapy  PLAN FOR NEXT  SESSION: Increase weight for prone rows and extension.    Lorayne Bender PT DPT 07/06/2022   A

## 2022-07-13 ENCOUNTER — Ambulatory Visit (HOSPITAL_BASED_OUTPATIENT_CLINIC_OR_DEPARTMENT_OTHER): Payer: BC Managed Care – PPO | Admitting: Physical Therapy

## 2022-07-13 DIAGNOSIS — G8929 Other chronic pain: Secondary | ICD-10-CM

## 2022-07-13 DIAGNOSIS — M6281 Muscle weakness (generalized): Secondary | ICD-10-CM | POA: Diagnosis not present

## 2022-07-13 DIAGNOSIS — M25611 Stiffness of right shoulder, not elsewhere classified: Secondary | ICD-10-CM

## 2022-07-13 DIAGNOSIS — M25521 Pain in right elbow: Secondary | ICD-10-CM

## 2022-07-14 ENCOUNTER — Encounter (HOSPITAL_BASED_OUTPATIENT_CLINIC_OR_DEPARTMENT_OTHER): Payer: Self-pay | Admitting: Physical Therapy

## 2022-07-14 NOTE — Therapy (Signed)
OUTPATIENT PHYSICAL THERAPY SHOULDER EVALUATION   Patient Name: Destiny Gardner MRN: [id number] DOB:Aug 16, 1965, 57 y.o., female Today's Date: 07/14/2022  END OF SESSION:  PT End of Session - 07/14/22 0917     Visit Number 34    Number of Visits 38    Date for PT Re-Evaluation 07/20/22    PT Start Time 1303    PT Stop Time 1341    PT Time Calculation (min) 38 min    Activity Tolerance Patient tolerated treatment well    Behavior During Therapy [facility] for tasks assessed/performed                      Past Medical History:  Diagnosis Date   Complication of anesthesia    post-op N/V, waking up during rhinoplasty and colonoscopy   Endometriosis    Headache    Hx Occular Migraine   Inflammatory bowel disease    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,now with constipation 2010   PONV (postoperative nausea and vomiting)    Past Surgical History:  Procedure Laterality Date   COLONOSCOPY  05/05/2009   TCS/EGD :noraml stomach,colon,and duodenum   COLONOSCOPY  2020   ESOPHAGOGASTRODUODENOSCOPY  05/05/2009   with TCS   HERNIA REPAIR  2015   Umbilical Hernia   LAPAROTOMY  03/07/1997   endometriosis   MYRINGOTOMY     Tubes,Tympanoplasty   RHINOPLASTY  1989   SHOULDER ARTHROSCOPY WITH ROTATOR CUFF REPAIR AND OPEN BICEPS TENODESIS Right 03/03/2022   Procedure: RIGHT SHOULDER ARTHROSCOPY WITH ROTATOR CUFF REPAIR AND BICEPS TENODESIS;  Surgeon: Huel Cote, MD;  Location: [facility] OR;  Service: Orthopedics;  Laterality: Right;   Tonillectomy  1990   WISDOM TOOTH EXTRACTION     Patient Active Problem List   Diagnosis Date Noted   Tendinitis of right rotator cuff 03/03/2022   Biceps tendinitis of right shoulder 03/03/2022   Cough 05/18/2015   ABDOMINAL PAIN 05/26/2009   CHANGE IN BOWELS 01/27/2009   CONSTIPATION, CHRONIC 12/11/2008   ENDOMETRIOSIS 12/11/2008   IRRITABLE BOWEL SYNDROME, HX OF 12/11/2008    PCP: Onnie Graham FNP   REFERRING PROVIDER: Dr Huel Cote    REFERRING DIAG:  Diagnosis  M75.81 (ICD-10-CM) - Tendinitis of right rotator cuff  RTC repair   THERAPY DIAG:  Muscle weakness (generalized)  Stiffness of right shoulder, not elsewhere classified  Chronic right shoulder pain  Pain in right elbow  Rationale for Evaluation and Treatment: Rehabilitation  ONSET DATE: 03/03/2022  Days since surgery: 132   SUBJECTIVE:  SUBJECTIVE STATEMENT: The patient reports yesterday she had pain in her shoulder. She is not sure why. She reports today it is fine. She is having times where she is forgetting that she had surgery.   PERTINENT HISTORY: Ocular Migranes  PAIN:  Are you having pain? Yes: NPRS scale: 2-3/10  Pain location: right anterior shoulder  Pain description: Cramping Aggravating factors: use of the shoulder/ sleeping  Relieving factors: rest, tylenol   PRECAUTIONS: Shoulder follow protocol   WEIGHT BEARING RESTRICTIONS: Yes NWB   FALLS:  Has patient fallen in last 6 months? No  LIVING ENVIRONMENT:  OCCUPATION: Stay at home mom.   Hobbies: work around [employer] doing house rehab, cooking.  PLOF: Independent  PATIENT GOALS:  Functional use of her arm   NEXT MD VISIT:   OBJECTIVE:   DIAGNOSTIC FINDINGS:  Nothing post op PATIENT SURVEYS:  FOTO    COGNITION: Overall cognitive status: Within functional limits for tasks assessed     SENSATION: A little numbness in the hand but it has improved   POSTURE: Good.   UPPER EXTREMITY ROM:   Passive ROM Right 2/20 Left eval  Shoulder flexion 125 No limit  Shoulder extension    Shoulder abduction    Shoulder adduction    Shoulder internal rotation 60 No limit      Shoulder external rotation 25 No limit   Elbow flexion    Elbow extension    Wrist flexion    Wrist  extension    Wrist ulnar deviation    Wrist radial deviation    Wrist pronation    Wrist supination    (Blank rows = not tested)  UPPER EXTREMITY ROM:  Active ROM Right eval Left eval  Shoulder flexion 128 Full   Shoulder extension    Shoulder abduction    Shoulder adduction    Shoulder extension    Shoulder internal rotation L3 with pulling in the front  To scapular border   Shoulder external rotation Can reach behind her head with mild pain    Elbow flexion    Elbow extension    Wrist flexion    Wrist extension    Wrist ulnar deviation    Wrist radial deviation    Wrist pronation    Wrist supination     (Blank rows = not tested)   UPPER EXTREMITY MMT:  MMT Right eval Left eval  Shoulder flexion 4   Shoulder extension    Shoulder abduction    Shoulder adduction    Shoulder internal rotation 4   Shoulder external rotation 4   Middle trapezius    Lower trapezius    Elbow flexion    Elbow extension    Wrist flexion    Wrist extension    Wrist ulnar deviation    Wrist radial deviation    Wrist pronation    Wrist supination    Grip strength (lbs)    (Blank rows = not tested) not tested at this time secondary to recent surgery    PALPATION:  No unexpected tenderness to palpation    TODAY'S TREATMENT:  5/9 Pulley abduction and flexion x1 mins each PROM shoulder flexion, ER STM anterior deltoids and biceps  Chest press x3 lb 2x10  ABC x2 2lbs  S/L ER 3x8 2lbs  Prone rows 3x10 2lbs  Prone extension 3x10 3 lns  IR Red band 3x10   4/30 Pulley abduction and flexion x1 mins each PROM shoulder flexion, ER STM anterior deltoids and biceps  Chest press x3 lb 2x10  ABC x2 2lbs  S/L ER 3x8 2lbs  Prone rows 3x10 2lbs  Prone extension 3x10 3 lns  IR Red band 3x10  4/26 Pulley abduction and flexion x1 mins each PROM  shoulder flexion, ER STM anterior deltoids and biceps  Chest press x1lb 2x10 Shoulder wand 2x10 x1 ABC x2 (No weight) S/L ER 3x8 2lbs  Prone rows x10 no weight, 2x8 1lb Prone extension x10 no weight, 2x8 1lb   4/23 Pulley abduction and flexion x94mins each PROM shoulder flexion  STM to right trap Chest press x1lb 2x10 Shoulder wand 2x10 ABC x2 2lb S/L ER 3x8 2lbs  Prone rows Prone extension   4/19 Pulley abudction and flexion 2x10 each PROM shoulder flexion  ABC x1 1lb, x1 2lb ER sidelying x10 1lb, 2x8 2lb Prone row x10 1lb, 2x8 2lbs Prone extension x10 1lb, 2x8 2lbs  Bicep curls 2lbs 3x8  Blue band rows 3x8 Blue band shoulder extension 3x10   4/16 PROM R shoulder all planes; grade I and II PA mobilization; trigger point release to pec; Bicep curls 2# 2x10  SL ER 2x10 1lb and 2lbs RPE of 5-6 with 2 and 3-4 at 1  ABC 1lb RPE 2 will advace next visit   Prone Row 2x15 2 lbs  Prone extension 2x15 1 lbs  and 2lbs  Prone W 2x15   Bicpes curl 2x15       4/9 Pulley flexion and abduction x2 mins PROM R shoulder  Bicep curls 2# 2x10 Standing chicken wing (1.5# cuff weight around upper arm) 2x10 Bent over triceps extension 2# 2x10 Standing fwd reach-straight out 1# 2x10 Bicep curl to OH press- 1# 3x10 OH ball rolls 2.2# ball- 20ea 4 way  UBE: No resistance, fwd, backwards (fatigued after )      PATIENT EDUCATION: Education details: HEP, symptom management, progression of activity.  Importance of sling use. Person educated: Patient Education method: Explanation, Demonstration, Tactile cues, Verbal cues, and Handouts Education comprehension: verbalized understanding, returned demonstration, verbal cues required, tactile cues required, and needs further education  HOME EXERCISE PROGRAM: Access Code: [email or web address] URL: [email or web address] Date: 03/08/2022 Prepared by: Lorayne Bender  Exercises - Reviewed pendulums - PROM into  ER and flexion   ASSESSMENT:  CLINICAL IMPRESSION: Therapy kept exercises consistent today. She continues to tolerate well. She is approaching the point where she is incident with her exercises. She had no tenderness to palpation or motion restrictions with manual therapy.   Therapy will continue to progress as tolerated.    OBJECTIVE IMPAIRMENTS: decreased activity tolerance, decreased mobility, decreased ROM, decreased strength, impaired UE functional use, and pain.   ACTIVITY LIMITATIONS: carrying, lifting, sleeping, bathing, dressing, self feeding, reach over head, and hygiene/grooming  PARTICIPATION LIMITATIONS: meal prep, cleaning, laundry, community activity, occupation, and yard work  PERSONAL FACTORS:none    REHAB POTENTIAL: Excellent  CLINICAL DECISION MAKING: Stable/uncomplicated  EVALUATION COMPLEXITY: Low   GOALS: Goals reviewed with patient? Yes  SHORT TERM GOALS: Target date: 04/06/2022    Patient will increase passive right shoulder flexion to 110 degrees Baseline: Goal  status: full passive achieved   2.  Patient will increase passive right shoulder external rotation to 70 degrees Baseline:  Goal status: 60 initial goal met 4/3 goal revised   3.  Patient will be independent with basic HEP following protocol suggestions Baseline:  Goal status: MET 3/12   LONG TERM GOALS: Target date: 05/03/2022 4/3    Patient will reach overhead to cabinet with 2 pound weight without pain by the end of rehab Baseline:  Goal status: can reach without a weight. Progressing   2.  Patient will reach behind her head to do her hair without pain Baseline:  Goal status: mild pain   3.  Patient will reach behind her back without pain Baseline:  Goal status: to L3  PLAN:  PT FREQUENCY: 2x/week  PT DURATION: 6 weeks   PLANNED INTERVENTIONS: Therapeutic exercises, Therapeutic activity, Neuromuscular re-education, Patient/Family education, Self Care, Joint  mobilization, Aquatic Therapy, Dry Needling, Cryotherapy, Moist heat, Ultrasound, Ionotophoresis 4mg /ml Dexamethasone, and Manual therapy  PLAN FOR NEXT SESSION: Increase weight for prone rows and extension.    Lorayne Bender PT DPT 07/14/2022   A

## 2022-07-20 ENCOUNTER — Ambulatory Visit (HOSPITAL_BASED_OUTPATIENT_CLINIC_OR_DEPARTMENT_OTHER): Payer: BC Managed Care – PPO | Admitting: Physical Therapy

## 2022-07-20 ENCOUNTER — Encounter (HOSPITAL_BASED_OUTPATIENT_CLINIC_OR_DEPARTMENT_OTHER): Payer: Self-pay | Admitting: Physical Therapy

## 2022-07-20 DIAGNOSIS — M6281 Muscle weakness (generalized): Secondary | ICD-10-CM | POA: Diagnosis not present

## 2022-07-20 DIAGNOSIS — M25521 Pain in right elbow: Secondary | ICD-10-CM

## 2022-07-20 DIAGNOSIS — G8929 Other chronic pain: Secondary | ICD-10-CM

## 2022-07-20 DIAGNOSIS — M25611 Stiffness of right shoulder, not elsewhere classified: Secondary | ICD-10-CM

## 2022-07-20 NOTE — Therapy (Addendum)
OUTPATIENT PHYSICAL THERAPY SHOULDER Treatment    Patient Name: Destiny Gardner MRN: [id number] DOB:1965/10/22, 57 y.o., female Today's Date: 07/14/2022  END OF SESSION:  PT End of Session - 07/14/22 0917     Visit Number 34    Number of Visits 38    Date for PT Re-Evaluation 07/20/22    PT Start Time 1303    PT Stop Time 1341    PT Time Calculation (min) 38 min    Activity Tolerance Patient tolerated treatment well    Behavior During Therapy [facility] for tasks assessed/performed                      Past Medical History:  Diagnosis Date   Complication of anesthesia    post-op N/V, waking up during rhinoplasty and colonoscopy   Endometriosis    Headache    Hx Occular Migraine   Inflammatory bowel disease    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,now with constipation 2010   PONV (postoperative nausea and vomiting)    Past Surgical History:  Procedure Laterality Date   COLONOSCOPY  05/05/2009   TCS/EGD :noraml stomach,colon,and duodenum   COLONOSCOPY  2020   ESOPHAGOGASTRODUODENOSCOPY  05/05/2009   with TCS   HERNIA REPAIR  2015   Umbilical Hernia   LAPAROTOMY  03/07/1997   endometriosis   MYRINGOTOMY     Tubes,Tympanoplasty   RHINOPLASTY  1989   SHOULDER ARTHROSCOPY WITH ROTATOR CUFF REPAIR AND OPEN BICEPS TENODESIS Right 03/03/2022   Procedure: RIGHT SHOULDER ARTHROSCOPY WITH ROTATOR CUFF REPAIR AND BICEPS TENODESIS;  Surgeon: Huel Cote, MD;  Location: [facility] OR;  Service: Orthopedics;  Laterality: Right;   Tonillectomy  1990   WISDOM TOOTH EXTRACTION     Patient Active Problem List   Diagnosis Date Noted   Tendinitis of right rotator cuff 03/03/2022   Biceps tendinitis of right shoulder 03/03/2022   Cough 05/18/2015   ABDOMINAL PAIN 05/26/2009   CHANGE IN BOWELS 01/27/2009   CONSTIPATION, CHRONIC 12/11/2008   ENDOMETRIOSIS 12/11/2008   IRRITABLE BOWEL SYNDROME, HX OF 12/11/2008    PCP: Onnie Graham FNP   REFERRING PROVIDER: Dr Huel Cote    REFERRING DIAG:  Diagnosis  M75.81 (ICD-10-CM) - Tendinitis of right rotator cuff  RTC repair   THERAPY DIAG:  Muscle weakness (generalized)  Stiffness of right shoulder, not elsewhere classified  Chronic right shoulder pain  Pain in right elbow  Rationale for Evaluation and Treatment: Rehabilitation  ONSET DATE: 03/03/2022  Days since surgery: 132   SUBJECTIVE:  SUBJECTIVE STATEMENT: The patient thinks she may be sleeping on her shoulder funny. She continues to have some pain in the front of her shoulder and down the posterior aspect of her shoulder. Therapy will continue to progress as tolerated.   PERTINENT HISTORY: Ocular Migranes  PAIN:  Are you having pain? Yes: NPRS scale: 2-3/10  Pain location: right anterior shoulder  Pain description: Cramping Aggravating factors: use of the shoulder/ sleeping  Relieving factors: rest, tylenol   PRECAUTIONS: Shoulder follow protocol   WEIGHT BEARING RESTRICTIONS: Yes NWB   FALLS:  Has patient fallen in last 6 months? No  LIVING ENVIRONMENT:  OCCUPATION: Stay at home mom.   Hobbies: work around [employer] doing house rehab, cooking.  PLOF: Independent  PATIENT GOALS:  Functional use of her arm   NEXT MD VISIT:   OBJECTIVE:   DIAGNOSTIC FINDINGS:  Nothing post op PATIENT SURVEYS:  FOTO    COGNITION: Overall cognitive status: Within functional limits for tasks assessed     SENSATION: A little numbness in the hand but it has improved   POSTURE: Good.   UPPER EXTREMITY ROM:   Passive ROM Right 2/20 Left eval  Shoulder flexion 125 No limit  Shoulder extension    Shoulder abduction    Shoulder adduction    Shoulder internal rotation 60 No limit      Shoulder external rotation 25 No limit   Elbow flexion    Elbow  extension    Wrist flexion    Wrist extension    Wrist ulnar deviation    Wrist radial deviation    Wrist pronation    Wrist supination    (Blank rows = not tested)  UPPER EXTREMITY ROM:  Active ROM Right eval Left eval  Shoulder flexion 128 Full   Shoulder extension    Shoulder abduction    Shoulder adduction    Shoulder extension    Shoulder internal rotation L3 with pulling in the front  To scapular border   Shoulder external rotation Can reach behind her head with mild pain    Elbow flexion    Elbow extension    Wrist flexion    Wrist extension    Wrist ulnar deviation    Wrist radial deviation    Wrist pronation    Wrist supination     (Blank rows = not tested)   UPPER EXTREMITY MMT:  MMT Right eval Left eval  Shoulder flexion 4   Shoulder extension    Shoulder abduction    Shoulder adduction    Shoulder internal rotation 4   Shoulder external rotation 4   Middle trapezius    Lower trapezius    Elbow flexion    Elbow extension    Wrist flexion    Wrist extension    Wrist ulnar deviation    Wrist radial deviation    Wrist pronation    Wrist supination    Grip strength (lbs)    (Blank rows = not tested) not tested at this time secondary to recent surgery    PALPATION:  No unexpected tenderness to palpation    TODAY'S TREATMENT:  5/15 PROM shoulder flexion, ER STM anterior deltoids and biceps  Grade II and III inferior and posterior glides   5/9 Pulley abduction and flexion x1 mins each PROM shoulder flexion, ER STM anterior deltoids and biceps  Chest press x3 lb 2x10  ABC x2 2lbs  S/L ER 3x8 2lbs  Prone rows 3x10 2lbs  Prone extension 3x10 3 lns  IR Red band 3x10   4/30 Pulley abduction and flexion x1 mins each PROM shoulder flexion, ER STM anterior deltoids and biceps  Chest press x3 lb 2x10  ABC  x2 2lbs  S/L ER 3x8 2lbs  Prone rows 3x10 2lbs  Prone extension 3x10 3 lns  IR Red band 3x10  4/26 Pulley abduction and flexion x1 mins each PROM shoulder flexion, ER STM anterior deltoids and biceps  Chest press x1lb 2x10 Shoulder wand 2x10 x1 ABC x2 (No weight) S/L ER 3x8 2lbs  Prone rows x10 no weight, 2x8 1lb Prone extension x10 no weight, 2x8 1lb   4/23 Pulley abduction and flexion x43mins each PROM shoulder flexion  STM to right trap Chest press x1lb 2x10 Shoulder wand 2x10 ABC x2 2lb S/L ER 3x8 2lbs  Prone rows Prone extension   4/19 Pulley abudction and flexion 2x10 each PROM shoulder flexion  ABC x1 1lb, x1 2lb ER sidelying x10 1lb, 2x8 2lb Prone row x10 1lb, 2x8 2lbs Prone extension x10 1lb, 2x8 2lbs  Bicep curls 2lbs 3x8  Blue band rows 3x8 Blue band shoulder extension 3x10   4/16 PROM R shoulder all planes; grade I and II PA mobilization; trigger point release to pec; Bicep curls 2# 2x10  SL ER 2x10 1lb and 2lbs RPE of 5-6 with 2 and 3-4 at 1  ABC 1lb RPE 2 will advace next visit   Prone Row 2x15 2 lbs  Prone extension 2x15 1 lbs  and 2lbs  Prone W 2x15   Bicpes curl 2x15       4/9 Pulley flexion and abduction x2 mins PROM R shoulder  Bicep curls 2# 2x10 Standing chicken wing (1.5# cuff weight around upper arm) 2x10 Bent over triceps extension 2# 2x10 Standing fwd reach-straight out 1# 2x10 Bicep curl to OH press- 1# 3x10 OH ball rolls 2.2# ball- 20ea 4 way  UBE: No resistance, fwd, backwards (fatigued after )      PATIENT EDUCATION: Education details: HEP, symptom management, progression of activity.  Importance of sling use. Person educated: Patient Education method: Explanation, Demonstration, Tactile cues, Verbal cues, and Handouts Education comprehension: verbalized understanding, returned demonstration, verbal cues required, tactile cues required, and needs further education  HOME EXERCISE PROGRAM: Access  Code: [email or web address] URL: [email or web address] Date: 03/08/2022 Prepared by: Lorayne Bender  Exercises - Reviewed pendulums - PROM into ER and flexion   ASSESSMENT:  CLINICAL IMPRESSION: Therapy focused on manual therapy. She reported improved pain after treatment. She was advised to start her exercises again in the next day or two but keep moving it. She had some tightness in her anterior shoulder. We will continue to progress as tolerated.   OBJECTIVE IMPAIRMENTS: decreased activity tolerance, decreased mobility, decreased ROM, decreased strength, impaired UE functional use, and pain.   ACTIVITY LIMITATIONS: carrying, lifting, sleeping, bathing, dressing, self feeding, reach over head, and hygiene/grooming  PARTICIPATION LIMITATIONS: meal prep, cleaning, laundry, community activity, occupation, and yard work  PERSONAL FACTORS:none    REHAB POTENTIAL: Excellent  CLINICAL DECISION MAKING: Stable/uncomplicated  EVALUATION COMPLEXITY: Low   GOALS: Goals reviewed with patient? Yes  SHORT TERM GOALS: Target date: 04/06/2022    Patient will increase passive right shoulder flexion to 110 degrees Baseline: Goal status: full passive achieved   2.  Patient will increase passive right shoulder external rotation to 70 degrees Baseline:  Goal status: 60 initial goal met 4/3 goal revised   3.  Patient will be independent with basic HEP following protocol suggestions Baseline:  Goal status: MET 3/12   LONG TERM GOALS: Target date: 05/03/2022 4/3    Patient will reach overhead to cabinet with 2 pound weight without pain by the end of rehab Baseline:  Goal status: can reach without a weight. Progressing   2.  Patient will reach behind her head to do her hair without pain Baseline:  Goal status: mild pain   3.  Patient will reach behind her back without pain Baseline:  Goal status: to L3  PLAN:  PT FREQUENCY: 2x/week  PT DURATION: 6 weeks   PLANNED  INTERVENTIONS: Therapeutic exercises, Therapeutic activity, Neuromuscular re-education, Patient/Family education, Self Care, Joint mobilization, Aquatic Therapy, Dry Needling, Cryotherapy, Moist heat, Ultrasound, Ionotophoresis 4mg /ml Dexamethasone, and Manual therapy  PLAN FOR NEXT SESSION: Increase weight for prone rows and extension.    Lorayne Bender PT DPT 07/14/2022   A

## 2022-07-25 ENCOUNTER — Telehealth: Payer: Self-pay

## 2022-07-25 NOTE — Transitions of Care (Post Inpatient/ED Visit) (Signed)
   07/25/2022  Name: Destiny Gardner MRN: [id number] DOB: [birth year]  Today's TOC FU Call Status: Today's TOC FU Call Status:: Unsuccessul Call (1st Attempt) Unsuccessful Call (1st Attempt) Date: 07/25/22  Attempted to reach the patient regarding the most recent Inpatient/ED visit.  Follow Up Plan: No further outreach attempts will be made at this time. We have been unable to contact the patient.  Signature L.Wilson,LPN

## 2022-07-27 ENCOUNTER — Ambulatory Visit (HOSPITAL_BASED_OUTPATIENT_CLINIC_OR_DEPARTMENT_OTHER): Payer: BC Managed Care – PPO | Admitting: Physical Therapy

## 2022-07-27 DIAGNOSIS — M6281 Muscle weakness (generalized): Secondary | ICD-10-CM | POA: Diagnosis not present

## 2022-07-27 DIAGNOSIS — G8929 Other chronic pain: Secondary | ICD-10-CM

## 2022-07-27 DIAGNOSIS — M25611 Stiffness of right shoulder, not elsewhere classified: Secondary | ICD-10-CM

## 2022-07-27 DIAGNOSIS — M25521 Pain in right elbow: Secondary | ICD-10-CM

## 2022-07-27 NOTE — Therapy (Unsigned)
OUTPATIENT PHYSICAL THERAPY SHOULDER Treatment    Patient Name: Destiny Gardner MRN: [id number] DOB:[birth year], 57 y.o., female Today's Date: 07/28/2022  END OF SESSION:  PT End of Session - 07/28/22 1040     Visit Number 36    Number of Visits 42    Date for PT Re-Evaluation 09/08/22    PT Start Time 1345    PT Stop Time 1428    PT Time Calculation (min) 43 min    Activity Tolerance Patient tolerated treatment well    Behavior During Therapy WFL for tasks assessed/performed                       Past Medical History:  Diagnosis Date   Complication of anesthesia    post-op N/V, waking up during rhinoplasty and colonoscopy   Endometriosis    Headache    Hx Occular Migraine   Inflammatory bowel disease    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,now with constipation 2010   PONV (postoperative nausea and vomiting)    Past Surgical History:  Procedure Laterality Date   COLONOSCOPY  05/05/2009   TCS/EGD :noraml stomach,colon,and duodenum   COLONOSCOPY  2020   ESOPHAGOGASTRODUODENOSCOPY  05/05/2009   with TCS   HERNIA REPAIR  2015   Umbilical Hernia   LAPAROTOMY  03/07/1997   endometriosis   MYRINGOTOMY     Tubes,Tympanoplasty   RHINOPLASTY  1989   SHOULDER ARTHROSCOPY WITH ROTATOR CUFF REPAIR AND OPEN BICEPS TENODESIS Right 03/03/2022   Procedure: RIGHT SHOULDER ARTHROSCOPY WITH ROTATOR CUFF REPAIR AND BICEPS TENODESIS;  Surgeon: Huel Cote, MD;  Location: [facility] OR;  Service: Orthopedics;  Laterality: Right;   Tonillectomy  1990   WISDOM TOOTH EXTRACTION     Patient Active Problem List   Diagnosis Date Noted   Tendinitis of right rotator cuff 03/03/2022   Biceps tendinitis of right shoulder 03/03/2022   Cough 05/18/2015   ABDOMINAL PAIN 05/26/2009   CHANGE IN BOWELS 01/27/2009   CONSTIPATION, CHRONIC 12/11/2008   ENDOMETRIOSIS 12/11/2008   IRRITABLE BOWEL SYNDROME, HX OF 12/11/2008    PCP: Onnie Graham FNP   REFERRING PROVIDER: Dr Huel Cote    REFERRING DIAG:  Diagnosis  M75.81 (ICD-10-CM) - Tendinitis of right rotator cuff  RTC repair   THERAPY DIAG:  Muscle weakness (generalized)  Stiffness of right shoulder, not elsewhere classified  Chronic right shoulder pain  Pain in right elbow  Rationale for Evaluation and Treatment: Rehabilitation  ONSET DATE: 03/03/2022  Days since surgery: 146   SUBJECTIVE:  SUBJECTIVE STATEMENT: The patient reported doing well over the past week or so until this weekend when she lifted heavy plantars.  She reported for 2 days she had significant pain.  Today the pain is more mild PERTINENT HISTORY: Ocular Migranes  PAIN:  Are you having pain? Yes: NPRS scale: 2-3/10  Pain location: right anterior shoulder  Pain description: Cramping Aggravating factors: use of the shoulder/ sleeping  Relieving factors: rest, tylenol   PRECAUTIONS: Shoulder follow protocol   WEIGHT BEARING RESTRICTIONS: Yes NWB   FALLS:  Has patient fallen in last 6 months? No  LIVING ENVIRONMENT:  OCCUPATION: Stay at home mom.   Hobbies: work around [employer] doing house rehab, cooking.  PLOF: Independent  PATIENT GOALS:  Functional use of her arm   NEXT MD VISIT:   OBJECTIVE:   DIAGNOSTIC FINDINGS:  Nothing post op PATIENT SURVEYS:  FOTO    COGNITION: Overall cognitive status: Within functional limits for tasks assessed     SENSATION: A little numbness in the hand but it has improved   POSTURE: Good.   UPPER EXTREMITY ROM:   Passive ROM Right 2/20 Left eval Right   Shoulder flexion 125 No limit Full   Shoulder extension     Shoulder abduction     Shoulder adduction     Shoulder internal rotation 60 No limit Full        Shoulder external rotation 25 No limit  65 with mild pain   Elbow flexion      Elbow extension     Wrist flexion     Wrist extension     Wrist ulnar deviation     Wrist radial deviation     Wrist pronation     Wrist supination     (Blank rows = not tested)  UPPER EXTREMITY ROM:  Active ROM Right eval Left eval Right  5/22  Shoulder flexion 128 Full    Shoulder extension     Shoulder abduction     Shoulder adduction     Shoulder extension     Shoulder internal rotation L3 with pulling in the front  To scapular border  Scap border with mild tightness   Shoulder external rotation Can reach behind her head with mild pain   Can reach behind her head without pain   Elbow flexion     Elbow extension     Wrist flexion     Wrist extension     Wrist ulnar deviation     Wrist radial deviation     Wrist pronation     Wrist supination      (Blank rows = not tested)   UPPER EXTREMITY MMT:  MMT Right eval Left eval Right  5/22 Left 5/22  Shoulder flexion 4  6.7 10.8  Shoulder extension      Shoulder abduction      Shoulder adduction      Shoulder internal rotation 4  15.2 19.8  Shoulder external rotation 4  7.3 14.8  Middle trapezius      Lower trapezius      Elbow flexion      Elbow extension      Wrist flexion      Wrist extension      Wrist ulnar deviation      Wrist radial deviation      Wrist pronation      Wrist supination      Grip strength (lbs)      (Blank rows = not tested)  not tested at this time secondary to recent surgery    PALPATION:  No unexpected tenderness to palpation    TODAY'S TREATMENT:                                                                                                                                         5/23 Manual: PROM shoulder flexion, ER STM anterior deltoids and biceps  Grade II and III inferior and posterior glides  ABC x2 2lbs  S/L ER 3x8 2lbs  Prone rows 3x10 2lbs  Prone extension 3x10 3 lns  IR Red band 3x10    Reviewed goals and formal strength testing numbers   5/15 PROM  shoulder flexion, ER STM anterior deltoids and biceps  Grade II and III inferior and posterior glides   5/9 Pulley abduction and flexion x1 mins each PROM shoulder flexion, ER STM anterior deltoids and biceps  Chest press x3 lb 2x10  ABC x2 2lbs  S/L ER 3x8 2lbs  Prone rows 3x10 2lbs  Prone extension 3x10 3 lns  IR Red band 3x10   4/30 Pulley abduction and flexion x1 mins each PROM shoulder flexion, ER STM anterior deltoids and biceps  Chest press x3 lb 2x10  ABC x2 2lbs  S/L ER 3x8 2lbs  Prone rows 3x10 2lbs  Prone extension 3x10 3 lns  IR Red band 3x10   PATIENT EDUCATION: Education details: HEP, symptom management, progression of activity.  Importance of sling use. Person educated: Patient Education method: Explanation, Demonstration, Tactile cues, Verbal cues, and Handouts Education comprehension: verbalized understanding, returned demonstration, verbal cues required, tactile cues required, and needs further education  HOME EXERCISE PROGRAM: Access Code: [email or web address] URL: [email or web address] Date: 03/08/2022 Prepared by: Lorayne Bender  Exercises - Reviewed pendulums - PROM into ER and flexion   ASSESSMENT:  CLINICAL IMPRESSION: Overall the content patient continues to make progress.  She does continue to have pain but is usually accompanied with higher level activities.  For instance, this weekend she lifted plantars and had some pain in her anterior shoulder.  She is advised to keep her weight slow when she is lifting objects right now.  We performed formal strength testing on her today.  Her strength deficits ranged from about 30% with external rotation to 50% with flexion.  Her internal rotation strength is about equal.  She was advised that over the next 2 to 3 months that strength will compete continue to improve.  She has a good exercise program to work on at home.  Next visit we will review her entire exercise program with anticipated discharge to  HEP.  OBJECTIVE IMPAIRMENTS: decreased activity tolerance, decreased mobility, decreased ROM, decreased strength, impaired UE functional use, and pain.   ACTIVITY LIMITATIONS: carrying, lifting, sleeping, bathing, dressing, self feeding, reach over head, and hygiene/grooming  PARTICIPATION LIMITATIONS: meal prep, cleaning, laundry, community activity, occupation, and yard work  PERSONAL FACTORS:none  REHAB POTENTIAL: Excellent  CLINICAL DECISION MAKING: Stable/uncomplicated  EVALUATION COMPLEXITY: Low   GOALS: Goals reviewed with patient? Yes  SHORT TERM GOALS: Target date: 04/06/2022    Patient will increase passive right shoulder flexion to 110 degrees Baseline: Goal status: full passive achieved   2.  Patient will increase passive right shoulder external rotation to 70 degrees Baseline:  Goal status: 60 initial goal met 4/3 goal revised   3.  Patient will be independent with basic HEP following protocol suggestions Baseline:  Goal status: MET 3/12   LONG TERM GOALS: Target date: 05/03/2022 4/3    Patient will reach overhead to cabinet with 2 pound weight without pain by the end of rehab Baseline:  Goal status: can reach without a weight. Progressing   2.  Patient will reach behind her head to do her hair without pain Baseline:  Goal status: mild pain   3.  Patient will reach behind her back without pain Baseline:  Goal status: to L3  PLAN:  PT FREQUENCY: 2x/week  PT DURATION: 6 weeks   PLANNED INTERVENTIONS: Therapeutic exercises, Therapeutic activity, Neuromuscular re-education, Patient/Family education, Self Care, Joint mobilization, Aquatic Therapy, Dry Needling, Cryotherapy, Moist heat, Ultrasound, Ionotophoresis 4mg /ml Dexamethasone, and Manual therapy  PLAN FOR NEXT SESSION: Review final HEP Lorayne Bender PT DPT 07/28/2022   A

## 2022-07-28 ENCOUNTER — Encounter (HOSPITAL_BASED_OUTPATIENT_CLINIC_OR_DEPARTMENT_OTHER): Payer: Self-pay | Admitting: Physical Therapy

## 2022-08-03 ENCOUNTER — Ambulatory Visit (HOSPITAL_BASED_OUTPATIENT_CLINIC_OR_DEPARTMENT_OTHER): Payer: BC Managed Care – PPO | Admitting: Physical Therapy

## 2022-08-03 ENCOUNTER — Encounter (HOSPITAL_BASED_OUTPATIENT_CLINIC_OR_DEPARTMENT_OTHER): Payer: Self-pay | Admitting: Physical Therapy

## 2022-08-03 DIAGNOSIS — M25611 Stiffness of right shoulder, not elsewhere classified: Secondary | ICD-10-CM

## 2022-08-03 DIAGNOSIS — M6281 Muscle weakness (generalized): Secondary | ICD-10-CM

## 2022-08-03 DIAGNOSIS — G8929 Other chronic pain: Secondary | ICD-10-CM

## 2022-08-03 DIAGNOSIS — M25521 Pain in right elbow: Secondary | ICD-10-CM

## 2022-08-03 NOTE — Therapy (Unsigned)
OUTPATIENT PHYSICAL THERAPY SHOULDER Treatment    Patient Name: Destiny Gardner MRN: [id number] DOB:1965/08/11, 57 y.o., female Today's Date: 07/28/2022  END OF SESSION:  PT End of Session - 07/28/22 1040     Visit Number 36    Number of Visits 42    Date for PT Re-Evaluation 09/08/22    PT Start Time 1345    PT Stop Time 1428    PT Time Calculation (min) 43 min    Activity Tolerance Patient tolerated treatment well    Behavior During Therapy WFL for tasks assessed/performed                       Past Medical History:  Diagnosis Date   Complication of anesthesia    post-op N/V, waking up during rhinoplasty and colonoscopy   Endometriosis    Headache    Hx Occular Migraine   Inflammatory bowel disease    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,now with constipation 2010   PONV (postoperative nausea and vomiting)    Past Surgical History:  Procedure Laterality Date   COLONOSCOPY  05/05/2009   TCS/EGD :noraml stomach,colon,and duodenum   COLONOSCOPY  2020   ESOPHAGOGASTRODUODENOSCOPY  05/05/2009   with TCS   HERNIA REPAIR  2015   Umbilical Hernia   LAPAROTOMY  03/07/1997   endometriosis   MYRINGOTOMY     Tubes,Tympanoplasty   RHINOPLASTY  1989   SHOULDER ARTHROSCOPY WITH ROTATOR CUFF REPAIR AND OPEN BICEPS TENODESIS Right 03/03/2022   Procedure: RIGHT SHOULDER ARTHROSCOPY WITH ROTATOR CUFF REPAIR AND BICEPS TENODESIS;  Surgeon: Huel Cote, MD;  Location: [facility] OR;  Service: Orthopedics;  Laterality: Right;   Tonillectomy  1990   WISDOM TOOTH EXTRACTION     Patient Active Problem List   Diagnosis Date Noted   Tendinitis of right rotator cuff 03/03/2022   Biceps tendinitis of right shoulder 03/03/2022   Cough 05/18/2015   ABDOMINAL PAIN 05/26/2009   CHANGE IN BOWELS 01/27/2009   CONSTIPATION, CHRONIC 12/11/2008   ENDOMETRIOSIS 12/11/2008   IRRITABLE BOWEL SYNDROME, HX OF 12/11/2008    PCP: Onnie Graham FNP   REFERRING PROVIDER: Dr Huel Cote    REFERRING DIAG:  Diagnosis  M75.81 (ICD-10-CM) - Tendinitis of right rotator cuff  RTC repair   THERAPY DIAG:  Muscle weakness (generalized)  Stiffness of right shoulder, not elsewhere classified  Chronic right shoulder pain  Pain in right elbow  Rationale for Evaluation and Treatment: Rehabilitation  ONSET DATE: 03/03/2022  Days since surgery: 146   SUBJECTIVE:  SUBJECTIVE STATEMENT: The patient continues to have pain but it is intermittent.   PERTINENT HISTORY: Ocular Migranes  PAIN:  Are you having pain? Yes: NPRS scale: 2-3/10  Pain location: right anterior shoulder  Pain description: Cramping Aggravating factors: use of the shoulder/ sleeping  Relieving factors: rest, tylenol   PRECAUTIONS: Shoulder follow protocol   WEIGHT BEARING RESTRICTIONS: Yes NWB   FALLS:  Has patient fallen in last 6 months? No  LIVING ENVIRONMENT:  OCCUPATION: Stay at home mom.   Hobbies: work around [employer] doing house rehab, cooking.  PLOF: Independent  PATIENT GOALS:  Functional use of her arm   NEXT MD VISIT:   OBJECTIVE:   DIAGNOSTIC FINDINGS:  Nothing post op PATIENT SURVEYS:  FOTO    COGNITION: Overall cognitive status: Within functional limits for tasks assessed     SENSATION: A little numbness in the hand but it has improved   POSTURE: Good.   UPPER EXTREMITY ROM:   Passive ROM Right 2/20 Left eval Right   Shoulder flexion 125 No limit Full   Shoulder extension     Shoulder abduction     Shoulder adduction     Shoulder internal rotation 60 No limit Full        Shoulder external rotation 25 No limit  65 with mild pain   Elbow flexion     Elbow extension     Wrist flexion     Wrist extension     Wrist ulnar deviation     Wrist radial deviation     Wrist  pronation     Wrist supination     (Blank rows = not tested)  UPPER EXTREMITY ROM:  Active ROM Right eval Left eval Right  5/22  Shoulder flexion 128 Full    Shoulder extension     Shoulder abduction     Shoulder adduction     Shoulder extension     Shoulder internal rotation L3 with pulling in the front  To scapular border  Scap border with mild tightness   Shoulder external rotation Can reach behind her head with mild pain   Can reach behind her head without pain   Elbow flexion     Elbow extension     Wrist flexion     Wrist extension     Wrist ulnar deviation     Wrist radial deviation     Wrist pronation     Wrist supination      (Blank rows = not tested)   UPPER EXTREMITY MMT:  MMT Right eval Left eval Right  5/22 Left 5/22  Shoulder flexion 4  6.7 10.8  Shoulder extension      Shoulder abduction      Shoulder adduction      Shoulder internal rotation 4  15.2 19.8  Shoulder external rotation 4  7.3 14.8  Middle trapezius      Lower trapezius      Elbow flexion      Elbow extension      Wrist flexion      Wrist extension      Wrist ulnar deviation      Wrist radial deviation      Wrist pronation      Wrist supination      Grip strength (lbs)      (Blank rows = not tested) not tested at this time secondary to recent surgery    PALPATION:  No unexpected tenderness to palpation    TODAY'S TREATMENT:  5/29 PROM shoulder flexion, ER STM anterior deltoids and biceps  Grade II and III inferior and posterior glides  ABC x2 2lbs  S/L ER 3x8 2lbs  Prone rows 3x10 2lbs  Prone extension 3x10 3 lns  IR Red band 3x10     5/23 Manual: PROM shoulder flexion, ER STM anterior deltoids and biceps  Grade II and III inferior and posterior glides  ABC x2 2lbs  S/L ER 3x8 2lbs  Prone rows 3x10 2lbs  Prone extension 3x10 3  lns  IR Red band 3x10    Reviewed goals and formal strength testing numbers   5/15 PROM shoulder flexion, ER STM anterior deltoids and biceps  Grade II and III inferior and posterior glides   5/9 Pulley abduction and flexion x1 mins each PROM shoulder flexion, ER STM anterior deltoids and biceps  Chest press x3 lb 2x10  ABC x2 2lbs  S/L ER 3x8 2lbs  Prone rows 3x10 2lbs  Prone extension 3x10 3 lns  IR Red band 3x10   4/30 Pulley abduction and flexion x1 mins each PROM shoulder flexion, ER STM anterior deltoids and biceps  Chest press x3 lb 2x10  ABC x2 2lbs  S/L ER 3x8 2lbs  Prone rows 3x10 2lbs  Prone extension 3x10 3 lns  IR Red band 3x10   PATIENT EDUCATION: Education details: HEP, symptom management, progression of activity.  Importance of sling use. Person educated: Patient Education method: Explanation, Demonstration, Tactile cues, Verbal cues, and Handouts Education comprehension: verbalized understanding, returned demonstration, verbal cues required, tactile cues required, and needs further education  HOME EXERCISE PROGRAM: Access Code: [email or web address] URL: [email or web address] Date: 03/08/2022 Prepared by: Lorayne Bender  Exercises - Reviewed pendulums - PROM into ER and flexion   ASSESSMENT:  CLINICAL IMPRESSION: Overall the content patient continues to make progress.  She does continue to have pain but is usually accompanied with higher level activities.  For instance, this weekend she lifted plantars and had some pain in her anterior shoulder.  She is advised to keep her weight slow when she is lifting objects right now.  We performed formal strength testing on her today.  Her strength deficits ranged from about 30% with external rotation to 50% with flexion.  Her internal rotation strength is about equal.  She was advised that over the next 2 to 3 months that strength will compete continue to improve.  She has a good exercise program to work  on at home.  Next visit we will review her entire exercise program with anticipated discharge to HEP.  OBJECTIVE IMPAIRMENTS: decreased activity tolerance, decreased mobility, decreased ROM, decreased strength, impaired UE functional use, and pain.   ACTIVITY LIMITATIONS: carrying, lifting, sleeping, bathing, dressing, self feeding, reach over head, and hygiene/grooming  PARTICIPATION LIMITATIONS: meal prep, cleaning, laundry, community activity, occupation, and yard work  PERSONAL FACTORS:none    REHAB POTENTIAL: Excellent  CLINICAL DECISION MAKING: Stable/uncomplicated  EVALUATION COMPLEXITY: Low   GOALS: Goals reviewed with patient? Yes  SHORT TERM GOALS: Target date: 04/06/2022    Patient will increase passive right shoulder flexion to 110 degrees Baseline: Goal status: full passive achieved   2.  Patient will increase passive right shoulder external rotation to 70 degrees Baseline:  Goal status: 60 initial goal met 4/3 goal revised   3.  Patient will be independent with basic HEP following protocol suggestions Baseline:  Goal status: MET 3/12   LONG TERM GOALS: Target date: 05/03/2022 4/3    Patient will reach overhead to cabinet  with 2 pound weight without pain by the end of rehab Baseline:  Goal status: can reach without a weight. Progressing   2.  Patient will reach behind her head to do her hair without pain Baseline:  Goal status: mild pain   3.  Patient will reach behind her back without pain Baseline:  Goal status: to L3  PLAN:  PT FREQUENCY: 2x/week  PT DURATION: 6 weeks   PLANNED INTERVENTIONS: Therapeutic exercises, Therapeutic activity, Neuromuscular re-education, Patient/Family education, Self Care, Joint mobilization, Aquatic Therapy, Dry Needling, Cryotherapy, Moist heat, Ultrasound, Ionotophoresis 4mg /ml Dexamethasone, and Manual therapy  PLAN FOR NEXT SESSION: Review final HEP Lorayne Bender PT DPT 07/28/2022   A

## 2022-08-04 ENCOUNTER — Encounter (HOSPITAL_BASED_OUTPATIENT_CLINIC_OR_DEPARTMENT_OTHER): Payer: Self-pay | Admitting: Physical Therapy

## 2022-08-17 ENCOUNTER — Ambulatory Visit (INDEPENDENT_AMBULATORY_CARE_PROVIDER_SITE_OTHER): Payer: BC Managed Care – PPO | Admitting: Orthopaedic Surgery

## 2022-08-17 DIAGNOSIS — M7581 Other shoulder lesions, right shoulder: Secondary | ICD-10-CM

## 2022-08-17 NOTE — Progress Notes (Signed)
Post Operative Evaluation    Procedure/Date of Surgery: Right shoulder rotator cuff repair biceps tenodesis 03/03/22  Interval History:    Destiny Gardner presents 6 months status post the above procedure.  Overall she is doing extremely well.  She does have some tightness particularly in the joint which causes pain with terminal extension although her motion is improving dramatically.   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,now with constipation 2010   PONV (postoperative nausea and vomiting)    Past Surgical History:  Procedure Laterality Date   COLONOSCOPY  05/05/2009   TCS/EGD :noraml stomach,colon,and duodenum   COLONOSCOPY  2020   ESOPHAGOGASTRODUODENOSCOPY  05/05/2009   with TCS   HERNIA REPAIR  2015   Umbilical Hernia   LAPAROTOMY  03/07/1997   endometriosis   MYRINGOTOMY     Tubes,Tympanoplasty   RHINOPLASTY  1989   SHOULDER ARTHROSCOPY WITH ROTATOR CUFF REPAIR AND OPEN BICEPS TENODESIS Right 03/03/2022   Procedure: RIGHT SHOULDER ARTHROSCOPY WITH ROTATOR CUFF REPAIR AND BICEPS TENODESIS;  Surgeon: Huel Cote, MD;  Location: [facility] OR;  Service: Orthopedics;  Laterality: Right;   Tonillectomy  1990   WISDOM TOOTH EXTRACTION     Social History   Socioeconomic History   Marital status: Married    Spouse name: Not on file   Number of children: Not on file   Years of education: Not on file   Highest education level: Not on file  Occupational History   Not on file  Tobacco Use   Smoking status: Never   Smokeless tobacco: Never  Substance and Sexual Activity   Alcohol use: Yes    Alcohol/week: 0.0 standard drinks of alcohol    Comment: social   Drug use: No   Sexual  activity: Yes  Other Topics Concern   Not on file  Social History Narrative   Not on file   Social Determinants of Health   Financial Resource Strain: Not on file  Food Insecurity: Not on file  Transportation Needs: Not on file  Physical Activity: Not on file  Stress: Not on file  Social Connections: Not on file   Family History  Problem Relation Age of Onset   COPD Mother        never smoker   Prostate cancer Father    Allergies  Allergen Reactions   Codeine Nausea And Vomiting   Ketorolac Tromethamine    Penicillins Hives   Chlorhexidine Rash   Current Outpatient Medications  Medication Sig Dispense Refill   Ascorbic Acid (VITAMIN C PO) Take 1 tablet by mouth daily.     aspirin EC 325 MG tablet Take 1 tablet (325 mg total) by mouth daily. 30 tablet 0   methylPREDNISolone (MEDROL DOSEPAK) 4 MG TBPK tablet Take per packet instructions 21 each 0   oxyCODONE (OXY IR/ROXICODONE) 5 MG immediate release tablet Take 1 tablet (5 mg total) by mouth every 4 (four) hours as needed (severe pain). 10 tablet 0   No current facility-administered medications for this visit.   No results found.  Review of Systems:  A ROS was performed including pertinent positives and negatives as documented in the HPI.   Musculoskeletal Exam:      Right shoulder incisions are well-healed.  In the standing position she is able to forward elevate to 170 degrees equal to contralateral side.  External rotation at side is to 50 degrees compared to 55 the left.  Internal rotation is to T12 bilaterally  Imaging:      I personally reviewed and interpreted the radiographs.   Assessment:   6 months status post right shoulder rotator cuff repair doing very well.  She has had some capsular tightness which I do believe is the cause of her joint centric pain.  At this time she is continuing to work through range of motion on this which I do believe a terminal stretching program will continue to get her  back to 100%.  I will plan to see her back in 3 months for reassessment Plan :    -Return to clinic 3 months      I personally saw and evaluated the patient, and participated in the management and treatment plan.  Huel Cote, MD Attending Physician, Orthopedic Surgery  This document was dictated using Dragon voice recognition software. A reasonable attempt at proof reading has been made to minimize errors.

## 2022-09-25 IMAGING — CT CT CARDIAC CORONARY ARTERY CALCIUM SCORE
3 series · 13 of 20 positions shown, 15 images · non-contrast
Comparison: None

CLINICAL DATA: Coronary calcium evaluation in this 54-year-old
white female with hyperlipidemia.

EXAM:
CT CARDIAC CORONARY ARTERY CALCIUM SCORE
TECHNIQUE: Non-contrast imaging through the heart was performed using
prospective ECG gating. Image post processing was performed on an
independent workstation, allowing for quantitative analysis of the
heart and coronary arteries. Note that this exam targets the heart
and the chest was not imaged in its entirety.

[Series 2: calcium scoring 2.00 qr36 bestdiast 70% hrt calciu · axial · 0.33mm/px · z∈[+1509,+1573]mm · 3 of 80 slices shown]
[im 16/80  vessel]
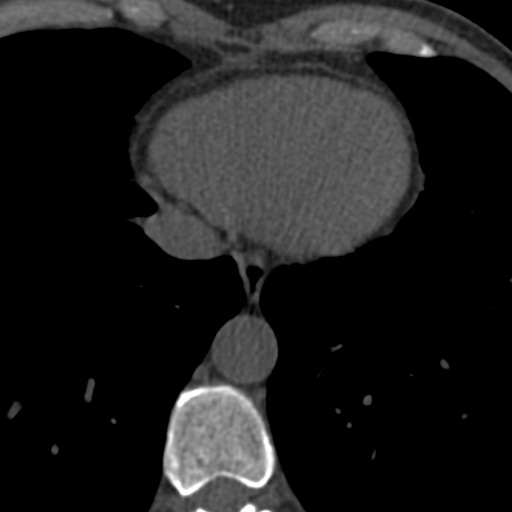
[im 32/80  vessel]
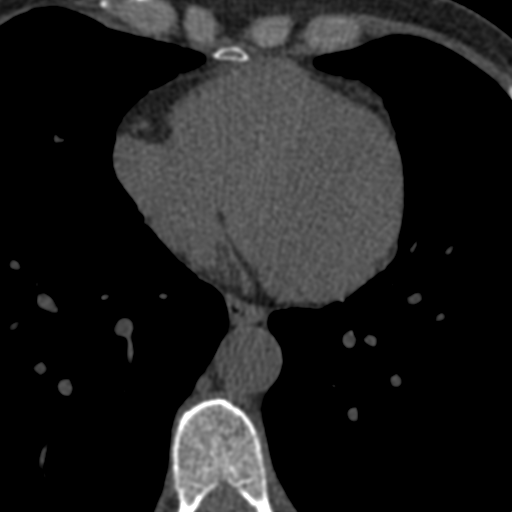
[im 48/80  vessel]
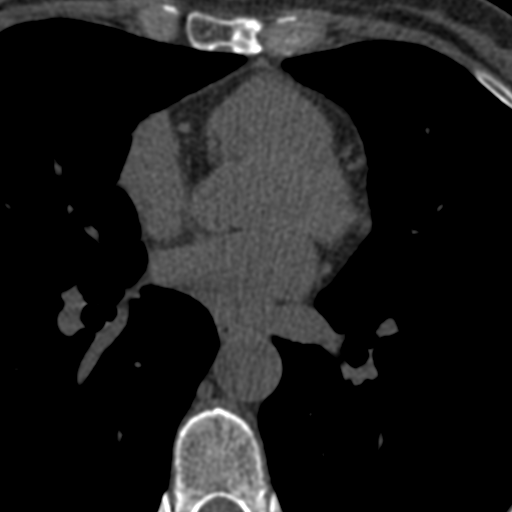

[Series 3: calcium scoring 2.00 br40 bestdiast 70% axial · axial · 0.49mm/px · z∈[+1505,+1609]mm · 5 of 80 slices shown, 7 images]
[im 14/80  vessel]
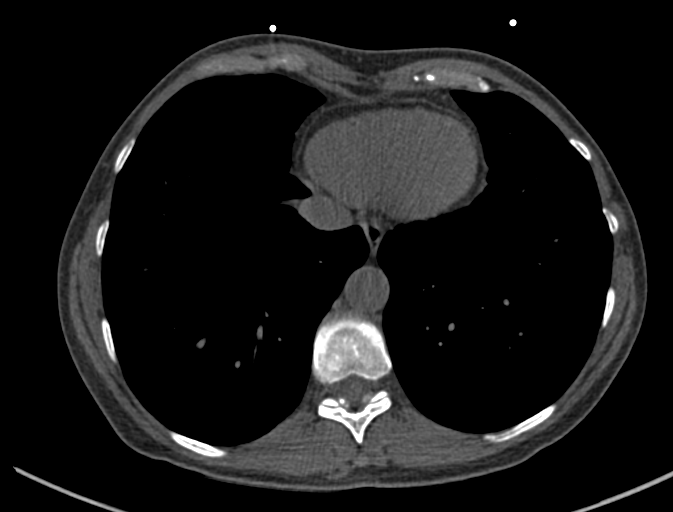
[im 14/80  lung]
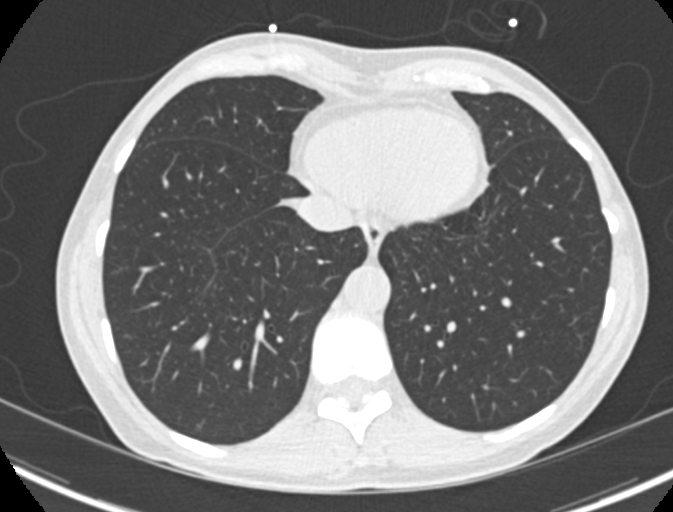
[im 27/80  vessel]
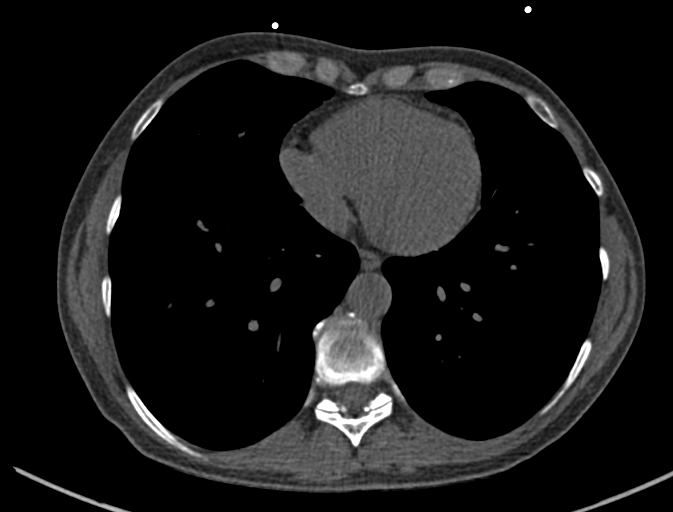
[im 40/80  vessel]
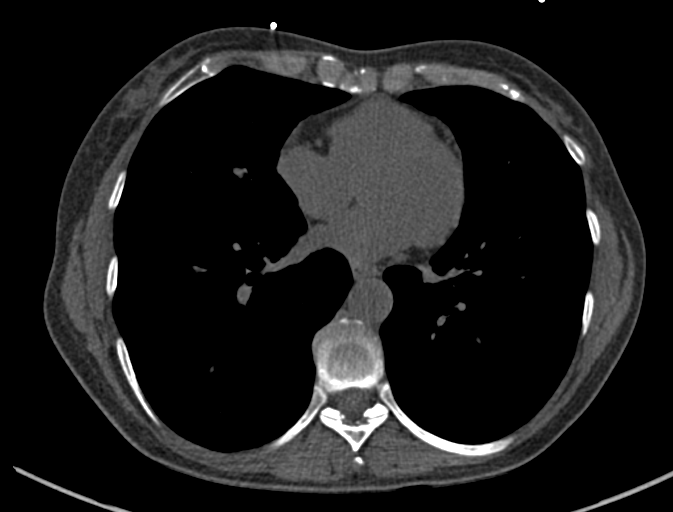
[im 53/80  vessel]
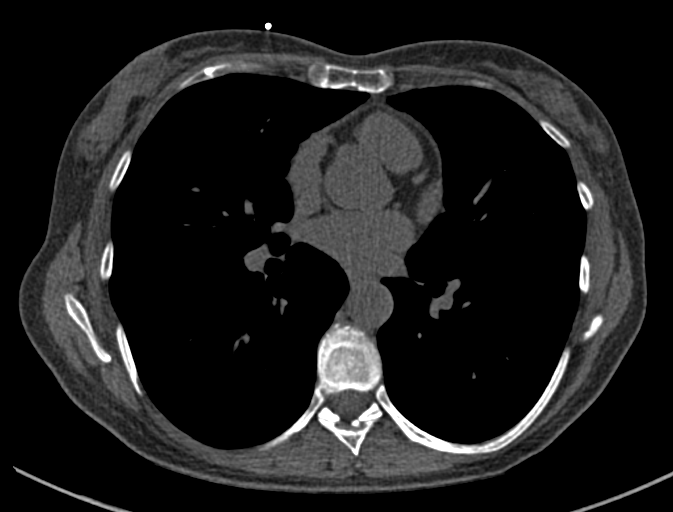
[im 66/80  vessel]
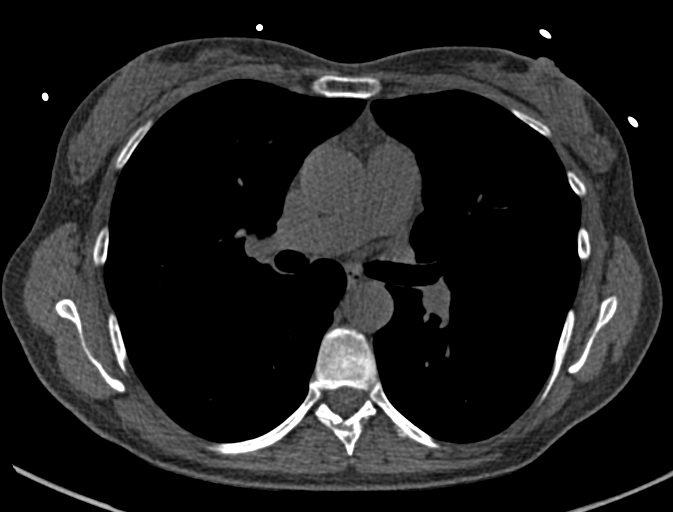
[im 66/80  lung]
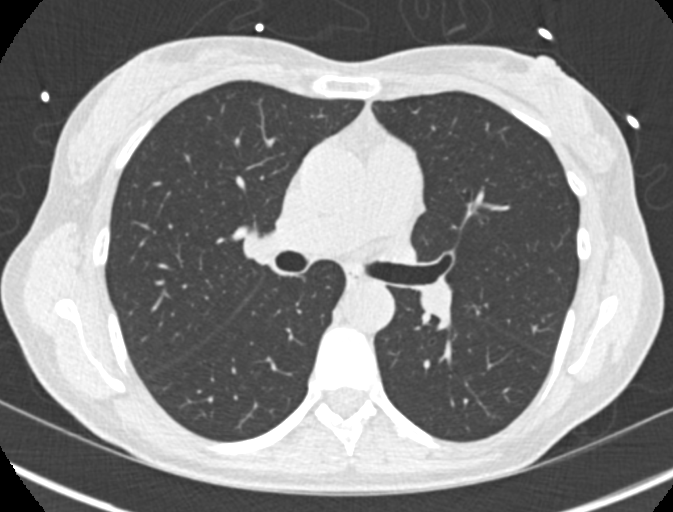

[Series 9: calcium scoring 2.00 br60 bestdiast 70% lungs · axial · 0.48mm/px · z∈[+1505,+1609]mm · 5 of 80 slices shown]
[im 14/80  vessel]
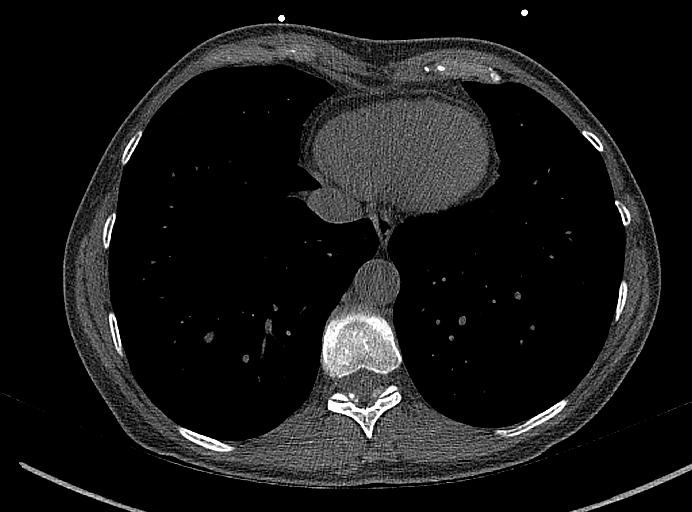
[im 27/80  vessel]
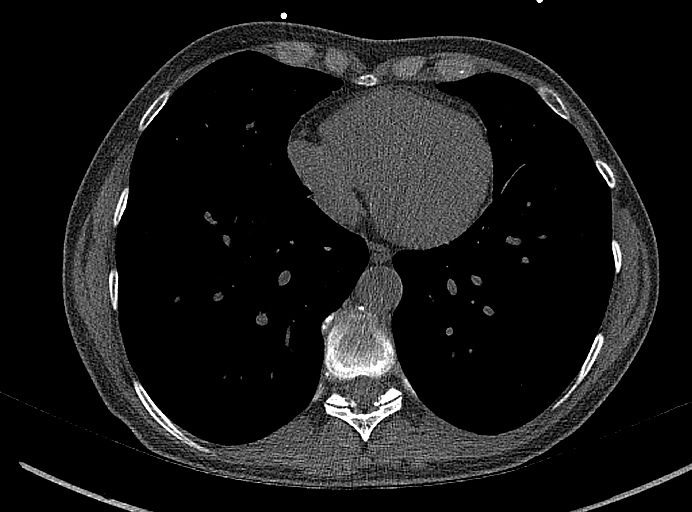
[im 40/80  vessel]
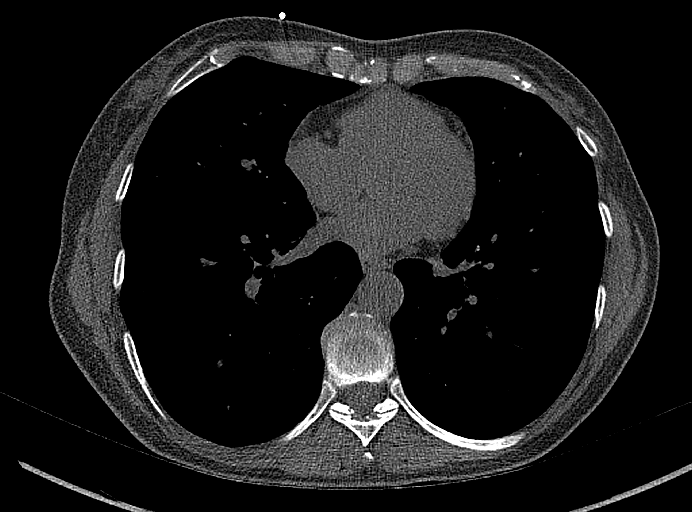
[im 53/80  vessel]
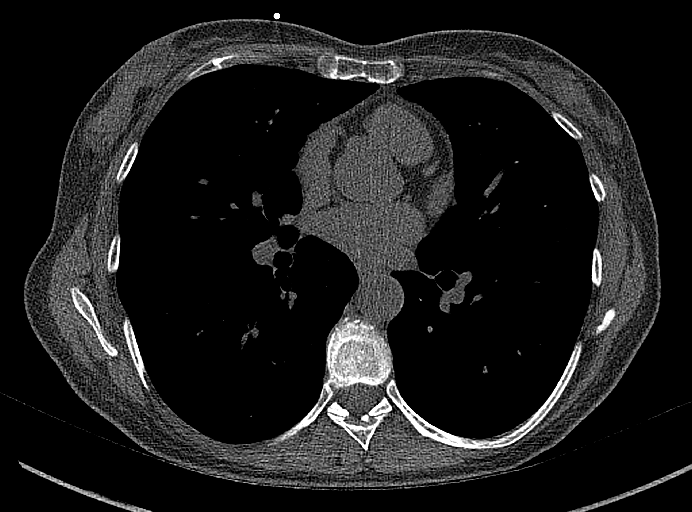
[im 66/80  vessel]
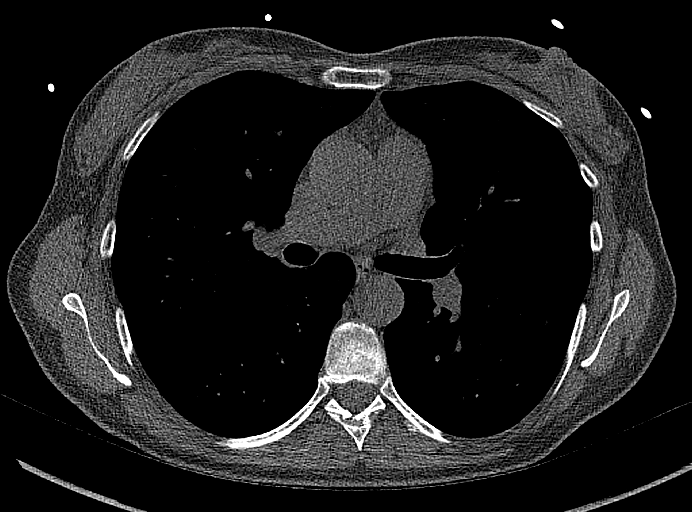

[13 of 20 positions shown; findings below may reference images not displayed]

FINDINGS: CORONARY CALCIUM SCORES:

Left Main: 0

LAD: 0

LCx: 0

RCA: 0

Total Agatston Score: 0

[HOSPITAL] percentile: 0

AORTA MEASUREMENTS:

Ascending Aorta: 31 mm

Descending Aorta: 22 mm

OTHER FINDINGS:

Cardiovascular: Heart size normal without pericardial effusion.
Normal caliber central pulmonary vessels. Limited assessment of
cardiovascular structures given lack of intravenous contrast.

Mediastinum/Nodes: No adenopathy in the visualized portions of the
chest.

Lungs/Pleura: Visualized airways are patent.  Lungs are clear.

Upper Abdomen: Incidental imaging of upper abdominal contents
without acute process. Very limited coverage only in the cephalad
aspect of the liver is included.

Musculoskeletal: No acute musculoskeletal findings.
IMPRESSION: 1. No evidence of calcified coronary artery disease. Coronary
calcium score of 0.
2. Mild ascending thoracic aortic dilation at 3.1 cm. Could consider
the following, annual imaging followup by CTA or MRA. This
recommendation follows 7323
ACCF/AHA/AATS/ACR/ASA/SCA/YDRISS/ANGELOTE/RADCHENKO/CIRIUS Guidelines for the
Diagnosis and Management of Patients with Thoracic Aortic Disease.
Circulation.7323; 121: [id number]. Aortic aneurysm NOS ([id number])

## 2022-09-28 ENCOUNTER — Telehealth: Payer: Self-pay

## 2022-09-28 NOTE — Transitions of Care (Post Inpatient/ED Visit) (Signed)
   09/28/2022  Name: Destiny Gardner MRN: [id number] DOB: 1966/02/27  Today's TOC FU Call Status: Today's TOC FU Call Status:: Unsuccessul Call (1st Attempt) Unsuccessful Call (1st Attempt) Date: 09/28/22  Attempted to reach the patient regarding the most recent Inpatient/ED visit.  Follow Up Plan: Additional outreach attempts will be made to reach the patient to complete the Transitions of Care (Post Inpatient/ED visit) call.   Signature  Karena Addison, LPN [facility] Nurse Health Advisor Direct Dial [phone]

## 2022-09-29 NOTE — Transitions of Care (Post Inpatient/ED Visit) (Signed)
   09/29/2022  Name: Destiny Gardner MRN: [id number] DOB: 12-19-1965  Today's TOC FU Call Status: Today's TOC FU Call Status:: Unsuccessful Call (2nd Attempt) Unsuccessful Call (1st Attempt) Date: 09/28/22 Unsuccessful Call (2nd Attempt) Date: 09/29/22  Attempted to reach the patient regarding the most recent Inpatient/ED visit.  Follow Up Plan: Additional outreach attempts will be made to reach the patient to complete the Transitions of Care (Post Inpatient/ED visit) call.   Signature Karena Addison, LPN [facility] Nurse Health Advisor Direct Dial [phone]

## 2022-10-03 NOTE — Transitions of Care (Post Inpatient/ED Visit) (Signed)
   10/03/2022  Name: Destiny Gardner MRN: [id number] DOB: 1965-08-06  Today's TOC FU Call Status: Today's TOC FU Call Status:: Unsuccessful Call (3rd Attempt) Unsuccessful Call (1st Attempt) Date: 09/28/22 Unsuccessful Call (2nd Attempt) Date: 09/29/22 Unsuccessful Call (3rd Attempt) Date: 10/03/22  Attempted to reach the patient regarding the most recent Inpatient/ED visit.  Follow Up Plan: No further outreach attempts will be made at this time. We have been unable to contact the patient.  Signature Karena Addison, LPN [facility] Nurse Health Advisor Direct Dial [phone]

## 2022-11-16 ENCOUNTER — Ambulatory Visit (INDEPENDENT_AMBULATORY_CARE_PROVIDER_SITE_OTHER): Payer: BC Managed Care – PPO | Admitting: Orthopaedic Surgery

## 2022-11-16 DIAGNOSIS — M7501 Adhesive capsulitis of right shoulder: Secondary | ICD-10-CM | POA: Diagnosis not present

## 2022-11-16 NOTE — Progress Notes (Signed)
Post Operative Evaluation    Procedure/Date of Surgery: Right shoulder rotator cuff repair biceps tenodesis 03/03/22  Interval History:    Destiny Gardner presents status post the above procedure.  At this point she is just having some tenderness with some residual capsulitis with overhead slight pinching.  This is overall mild.  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,now with constipation 2010   PONV (postoperative nausea and vomiting)    Past Surgical History:  Procedure Laterality Date   COLONOSCOPY  05/05/2009   TCS/EGD :noraml stomach,colon,and duodenum   COLONOSCOPY  2020   ESOPHAGOGASTRODUODENOSCOPY  05/05/2009   with TCS   HERNIA REPAIR  2015   Umbilical Hernia   LAPAROTOMY  03/07/1997   endometriosis   MYRINGOTOMY     Tubes,Tympanoplasty   RHINOPLASTY  1989   SHOULDER ARTHROSCOPY WITH ROTATOR CUFF REPAIR AND OPEN BICEPS TENODESIS Right 03/03/2022   Procedure: RIGHT SHOULDER ARTHROSCOPY WITH ROTATOR CUFF REPAIR AND BICEPS TENODESIS;  Surgeon: Huel Cote, MD;  Location: [facility] OR;  Service: Orthopedics;  Laterality: Right;   Tonillectomy  1990   WISDOM TOOTH EXTRACTION     Social History   Socioeconomic History   Marital status: Married    Spouse name: Not on file   Number of children: Not on file   Years of education: Not on file   Highest education level: Not on file  Occupational History   Not on file  Tobacco Use   Smoking status: Never   Smokeless tobacco: Never  Substance and Sexual Activity   Alcohol use: Yes    Alcohol/week: 0.0 standard drinks of alcohol    Comment: social   Drug use: No   Sexual activity: Yes  Other Topics Concern    Not on file  Social History Narrative   Not on file   Social Determinants of Health   Financial Resource Strain: Not on file  Food Insecurity: Not on file  Transportation Needs: Not on file  Physical Activity: Not on file  Stress: Not on file  Social Connections: Unknown (07/17/2021)   Received from [facility]   Social Network    Social Network: Not on file   Family History  Problem Relation Age of Onset   COPD Mother        never smoker   Prostate cancer Father    Allergies  Allergen Reactions   Codeine Nausea And Vomiting   Ketorolac Tromethamine    Penicillins Hives   Chlorhexidine Rash   Current Outpatient Medications  Medication Sig Dispense Refill   Ascorbic Acid (VITAMIN C PO) Take 1 tablet by mouth daily.     aspirin EC 325 MG tablet Take 1 tablet (325 mg total) by mouth daily. 30 tablet 0   methylPREDNISolone (MEDROL DOSEPAK) 4 MG TBPK tablet Take per packet instructions 21 each 0   oxyCODONE (OXY IR/ROXICODONE) 5 MG immediate release tablet Take 1 tablet (5 mg total) by mouth every 4 (four) hours as needed (severe pain). 10 tablet 0   No  current facility-administered medications for this visit.   No results found.  Review of Systems:   A ROS was performed including pertinent positives and negatives as documented in the HPI.   Musculoskeletal Exam:      Right shoulder incisions are well-healed.  In the standing position she is able to forward elevate to 170 degrees equal to contralateral side.  External rotation at side is to 50 degrees compared to 55 the left.  Internal rotation is to T12 bilaterally  Imaging:      I personally reviewed and interpreted the radiographs.   Assessment:   9 months status post right shoulder rotator cuff repair doing very well.  She does have some residual capsulitis which I believe would benefit from an injection at today's visit.  Will plan for a right ultrasound-guided anterior and posterior  glenohumeral injection after verbal consent was obtained Plan :    -Return to clinic 3 months      I personally saw and evaluated the patient, and participated in the management and treatment plan.  Huel Cote, MD Attending Physician, Orthopedic Surgery  This document was dictated using Dragon voice recognition software. A reasonable attempt at proof reading has been made to minimize errors.

## 2022-12-09 ENCOUNTER — Encounter (HOSPITAL_BASED_OUTPATIENT_CLINIC_OR_DEPARTMENT_OTHER): Payer: Self-pay | Admitting: Orthopaedic Surgery

## 2023-08-24 ENCOUNTER — Ambulatory Visit (INDEPENDENT_AMBULATORY_CARE_PROVIDER_SITE_OTHER)

## 2023-08-24 ENCOUNTER — Encounter: Payer: Self-pay | Admitting: Podiatry

## 2023-08-24 ENCOUNTER — Ambulatory Visit: Payer: Self-pay

## 2023-08-24 DIAGNOSIS — R600 Localized edema: Secondary | ICD-10-CM | POA: Diagnosis not present

## 2023-08-24 DIAGNOSIS — S9032XA Contusion of left foot, initial encounter: Secondary | ICD-10-CM

## 2023-08-24 NOTE — Progress Notes (Signed)
 Subjective:   Patient ID: Destiny Gardner, female   DOB: 58 y.o.   MRN: [id number]   HPI Patient presents concerned about pain on top of the left foot and swelling into the foot and ankle with history of having a lot of bites in the last 2 weeks and states it has been sore.  Patient does not smoke likes to be active   Review of Systems  All other systems reviewed and are negative.       Objective:  Physical Exam Vitals and nursing note reviewed.  Constitutional:      Appearance: She is well-developed.  Pulmonary:     Effort: Pulmonary effort is normal.   Musculoskeletal:        General: Normal range of motion.   Skin:    General: Skin is warm.   Neurological:     Mental Status: She is alert.   Neurovascular status was found to be intact muscle strength was adequate range of motion adequate with discomfort dorsal left foot with moderate edema +1 pitting into the midfoot and ankle history of injury but no acute discomfort noted associated with condition.  Good digital perfusion well-oriented x 3      Assessment:  Inflammatory condition consistent with probable trauma with negative Celine Collard' sign noted     Plan:  H&P reviewed and I went ahead today and I recommended compression therapy and ankle compression stocking was dispensed.  I advised on elevation ice therapy if symptoms get worse we may have to consider other aggressive treatment or immobilization but I think this should heal uneventfully  X-rays were negative for signs of fracture or bony injury associated with condition

## 2023-08-29 ENCOUNTER — Ambulatory Visit: Payer: Self-pay | Admitting: Podiatry

## 2023-10-11 ENCOUNTER — Other Ambulatory Visit (HOSPITAL_BASED_OUTPATIENT_CLINIC_OR_DEPARTMENT_OTHER): Payer: Self-pay | Admitting: Obstetrics and Gynecology

## 2023-10-11 DIAGNOSIS — Z8262 Family history of osteoporosis: Secondary | ICD-10-CM

## 2023-10-12 ENCOUNTER — Other Ambulatory Visit: Payer: Self-pay | Admitting: Obstetrics and Gynecology

## 2023-10-12 DIAGNOSIS — R928 Other abnormal and inconclusive findings on diagnostic imaging of breast: Secondary | ICD-10-CM

## 2023-10-17 ENCOUNTER — Ambulatory Visit
Admission: RE | Admit: 2023-10-17 | Discharge: 2023-10-17 | Disposition: A | Discharge status: Home / Self Care | Payer: Self-pay | Source: Ambulatory Visit | Attending: Obstetrics and Gynecology | Admitting: Obstetrics and Gynecology

## 2023-10-17 DIAGNOSIS — R928 Other abnormal and inconclusive findings on diagnostic imaging of breast: Secondary | ICD-10-CM

## 2023-10-17 LAB — HM MAMMOGRAPHY

## 2023-10-17 LAB — HM PAP SMEAR

## 2023-12-20 ENCOUNTER — Ambulatory Visit: Payer: Self-pay | Admitting: Internal Medicine

## 2023-12-20 VITALS — BP 115/75 | HR 91 | Temp 98.7°F | Ht 68.5 in | Wt 138.8 lb

## 2023-12-20 DIAGNOSIS — E782 Mixed hyperlipidemia: Secondary | ICD-10-CM | POA: Insufficient documentation

## 2023-12-20 NOTE — Progress Notes (Signed)
 New Patient Office Visit     CC/Reason for Visit: Establish care, discuss chronic conditions Previous PCP: Dr. Lunette Last Visit: August 2025  HPI: Destiny Gardner is a 58 y.o. female who is coming in today for the above mentioned reasons. Past Medical History is significant for: Hyperlipidemia not on medication.  She is otherwise feeling well.  She follows routinely with GYN.  Mammogram colonoscopy and Pap smear up-to-date, will obtain records.  She states she had labs recently with a very elevated cholesterol of above 300 with an LDL of around 160.  She had a normal coronary CT with a calcium score of 0 a few years back.  She does not smoke, drinks only occasionally, family history significant for mother with hyperlipidemia and a father with prostate cancer.   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,now with constipation 2010   PONV (postoperative nausea and vomiting)     Past Surgical History:  Procedure Laterality Date   COLONOSCOPY  05/05/2009   [facility]CS/EGD :noraml stomach,colon,and duodenum   COLONOSCOPY  2020   ESOPHAGOGASTRODUODENOSCOPY  05/05/2009   with TCS   HERNIA REPAIR  2015   Umbilical Hernia   LAPAROTOMY  03/07/1997   endometriosis   MYRINGOTOMY     Tubes,Tympanoplasty   RHINOPLASTY  1989   SHOULDER ARTHROSCOPY WITH ROTATOR CUFF REPAIR AND OPEN BICEPS TENODESIS Right 03/03/2022   Procedure: RIGHT SHOULDER ARTHROSCOPY WITH ROTATOR CUFF REPAIR AND BICEPS TENODESIS;  Surgeon: Genelle Standing, MD;  Location: [facility] OR;  Service: Orthopedics;  Laterality: Right;   Tonillectomy  1990   WISDOM TOOTH EXTRACTION      Social History:  reports that she has never smoked. She has never used smokeless tobacco. She reports current alcohol use.  She reports that she does not use drugs.  Allergies: Allergies  Allergen Reactions   Codeine Nausea And Vomiting   Ketorolac Tromethamine    Penicillins Hives   Chlorhexidine  Rash    Family History:  Family History  Problem Relation Age of Onset   COPD Mother        never smoker   Prostate cancer Father      Current Outpatient Medications:    cholecalciferol (VITAMIN D3) 25 MCG (1000 UNIT) tablet, Take 2,000 Units by mouth daily., Disp: , Rfl:    Multiple Vitamins-Minerals (CENTRUM ADULT 50+ MULTIGUMMIES PO), Take by mouth., Disp: , Rfl:    Ascorbic Acid (VITAMIN C PO), Take 1 tablet by mouth daily., Disp: , Rfl:    aspirin  EC 325 MG tablet, Take 1 tablet (325 mg total) by mouth daily., Disp: 30 tablet, Rfl: 0   methylPREDNISolone  (MEDROL  DOSEPAK) 4 MG TBPK tablet, Take per packet instructions, Disp: 21 each, Rfl: 0   oxyCODONE  (OXY IR/ROXICODONE ) 5 MG immediate release tablet, Take 1 tablet (5 mg total) by mouth every 4 (four) hours as needed (severe pain)., Disp: 10 tablet, Rfl: 0  Review of Systems:  Negative except as indicated in HPI.   Physical Exam: Vitals:   12/20/23 1412 12/20/23 1437  BP: 132/82 115/75  Pulse: 91   Temp: 98.7 F (37.1 C)   TempSrc: Oral   SpO2: 97%   Weight: 138 lb 12.8 oz (63 kg)   Height: 5' 8.5 (1.74  m)    Body mass index is 20.8 kg/m.  Physical Exam Vitals reviewed.  Constitutional:      Appearance: Normal appearance.  HENT:     Head: Normocephalic and atraumatic.  Eyes:     Conjunctiva/sclera: Conjunctivae normal.  Cardiovascular:     Rate and Rhythm: Normal rate and regular rhythm.  Pulmonary:     Effort: Pulmonary effort is normal.     Breath sounds: Normal breath sounds.  Skin:    General: Skin is warm and dry.  Neurological:     General: No focal deficit present.     Mental Status: She is alert and oriented to person, place, and time.  Psychiatric:        Mood and Affect: Mood normal.        Behavior: Behavior  normal.        Thought Content: Thought content normal.        Judgment: Judgment normal.       Impression and Plan:  Mixed hyperlipidemia  Return in 6 months to recheck lipids, given calcium score of 0 okay to monitor off medication for now.  Time spent: 32 minutes reviewing chart, interviewing and examining patient and formulating plan of care.        Tully Theophilus Andrews, MD [facility]

## 2023-12-28 ENCOUNTER — Encounter: Payer: Self-pay | Admitting: Internal Medicine

## 2024-01-10 ENCOUNTER — Ambulatory Visit (INDEPENDENT_AMBULATORY_CARE_PROVIDER_SITE_OTHER)

## 2024-01-10 DIAGNOSIS — Z8262 Family history of osteoporosis: Secondary | ICD-10-CM | POA: Diagnosis not present

## 2024-01-10 DIAGNOSIS — Z13820 Encounter for screening for osteoporosis: Secondary | ICD-10-CM | POA: Diagnosis not present
